# Patient Record
Sex: Male | Born: 1960 | Race: Black or African American | Hispanic: No | Marital: Married | Smoking: Never smoker
Health system: Southern US, Community
[De-identification: ages and names within clinical notes are randomized; demographics above are authoritative.]

## PROBLEM LIST (undated history)

## (undated) DIAGNOSIS — I10 Essential (primary) hypertension: Secondary | ICD-10-CM

---

## 2018-08-13 ENCOUNTER — Encounter (HOSPITAL_BASED_OUTPATIENT_CLINIC_OR_DEPARTMENT_OTHER): Payer: Self-pay | Admitting: *Deleted

## 2018-08-13 ENCOUNTER — Emergency Department (HOSPITAL_BASED_OUTPATIENT_CLINIC_OR_DEPARTMENT_OTHER)
Admission: EM | Admit: 2018-08-13 | Discharge: 2018-08-13 | Disposition: A | Discharge status: Home / Self Care | Payer: Self-pay | Attending: Emergency Medicine | Admitting: Emergency Medicine

## 2018-08-13 ENCOUNTER — Emergency Department (HOSPITAL_BASED_OUTPATIENT_CLINIC_OR_DEPARTMENT_OTHER): Payer: Self-pay

## 2018-08-13 ENCOUNTER — Other Ambulatory Visit: Payer: Self-pay

## 2018-08-13 DIAGNOSIS — M545 Low back pain, unspecified: Secondary | ICD-10-CM

## 2018-08-13 DIAGNOSIS — Z79899 Other long term (current) drug therapy: Secondary | ICD-10-CM | POA: Insufficient documentation

## 2018-08-13 DIAGNOSIS — R109 Unspecified abdominal pain: Secondary | ICD-10-CM | POA: Insufficient documentation

## 2018-08-13 DIAGNOSIS — I10 Essential (primary) hypertension: Secondary | ICD-10-CM | POA: Insufficient documentation

## 2018-08-13 HISTORY — DX: Essential (primary) hypertension: I10

## 2018-08-13 LAB — BASIC METABOLIC PANEL
Anion gap: 9 (ref 5–15)
BUN: 14 mg/dL (ref 6–20)
CO2: 22 mmol/L (ref 22–32)
Calcium: 8.4 mg/dL — ABNORMAL LOW (ref 8.9–10.3)
Chloride: 101 mmol/L (ref 98–111)
Creatinine, Ser: 0.9 mg/dL (ref 0.61–1.24)
GFR calc Af Amer: >60 mL/min (ref >60)
GFR calc non Af Amer: >60 mL/min (ref >60)
Glucose, Bld: 111 mg/dL — ABNORMAL HIGH (ref 70–99)
Potassium: 3.5 mmol/L (ref 3.5–5.1)
Sodium: 132 mmol/L — ABNORMAL LOW (ref 135–145)

## 2018-08-13 LAB — URINALYSIS, ROUTINE W REFLEX MICROSCOPIC
Bilirubin Urine: NEGATIVE
Glucose, UA: NEGATIVE mg/dL
Hgb urine dipstick: NEGATIVE
Ketones, ur: NEGATIVE mg/dL
Leukocytes, UA: NEGATIVE
Nitrite: NEGATIVE
Protein, ur: NEGATIVE mg/dL
Specific Gravity, Urine: 1.015 (ref 1.005–1.030)
pH: 6 (ref 5.0–8.0)

## 2018-08-13 LAB — CBC WITH DIFFERENTIAL/PLATELET
Abs Immature Granulocytes: 0.02 10*3/uL (ref 0.00–0.07)
Basophils Absolute: 0 10*3/uL (ref 0.0–0.1)
Basophils Relative: 0 %
EOS PCT: 1 %
Eosinophils Absolute: 0.1 10*3/uL (ref 0.0–0.5)
HCT: 49 % (ref 39.0–52.0)
Hemoglobin: 15.6 g/dL (ref 13.0–17.0)
Immature Granulocytes: 0 %
Lymphocytes Relative: 25 %
Lymphs Abs: 2 10*3/uL (ref 0.7–4.0)
MCH: 28 pg (ref 26.0–34.0)
MCHC: 31.8 g/dL (ref 30.0–36.0)
MCV: 88 fL (ref 80.0–100.0)
Monocytes Absolute: 0.7 10*3/uL (ref 0.1–1.0)
Monocytes Relative: 8 %
Neutro Abs: 5 10*3/uL (ref 1.7–7.7)
Neutrophils Relative %: 66 %
Platelets: 222 10*3/uL (ref 150–400)
RBC: 5.57 MIL/uL (ref 4.22–5.81)
RDW: 13.5 % (ref 11.5–15.5)
WBC: 7.7 10*3/uL (ref 4.0–10.5)
nRBC: 0 % (ref 0.0–0.2)

## 2018-08-13 IMAGING — CT CT ABD-PELV W/ CM
2 of 5 series · 16 of 46 positions shown, 18 images · IV contrast (APPLIED)
Comparison: None.

CLINICAL DATA: Right flank pain

EXAM:
CT ABDOMEN AND PELVIS WITH CONTRAST
TECHNIQUE: Multidetector CT imaging of the abdomen and pelvis was performed
using the standard protocol following bolus administration of
intravenous contrast.
CONTRAST:  100mL [I6] IOPAMIDOL ([I6]) INJECTION 61%

[Series 2: axial st · axial · 0.84mm/px · z∈[+687,+1162]mm · 13 of 107 slices shown, 15 images]
[im 6/107  soft-tissue]
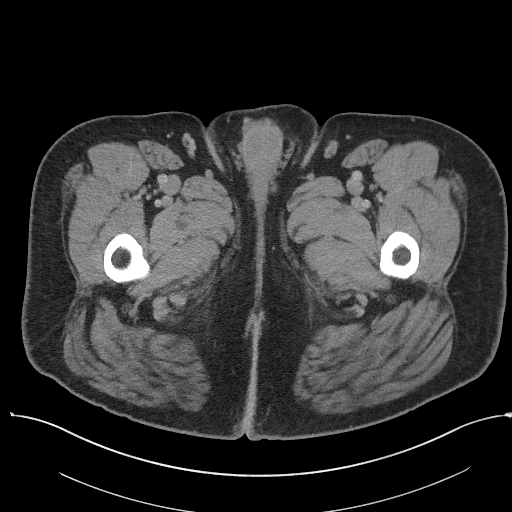
[im 6/107  bone]
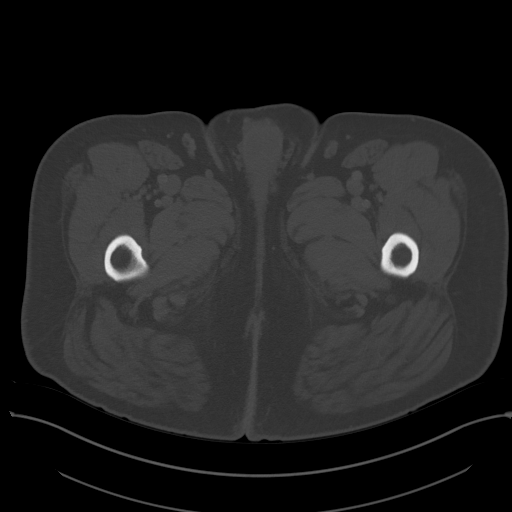
[im 12/107  soft-tissue]
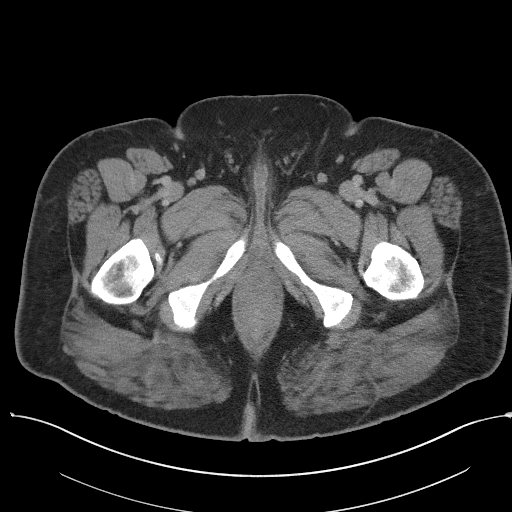
[im 24/107  soft-tissue]
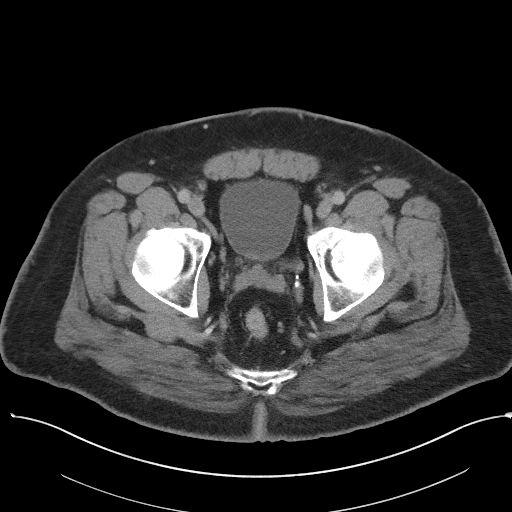
[im 30/107  soft-tissue]
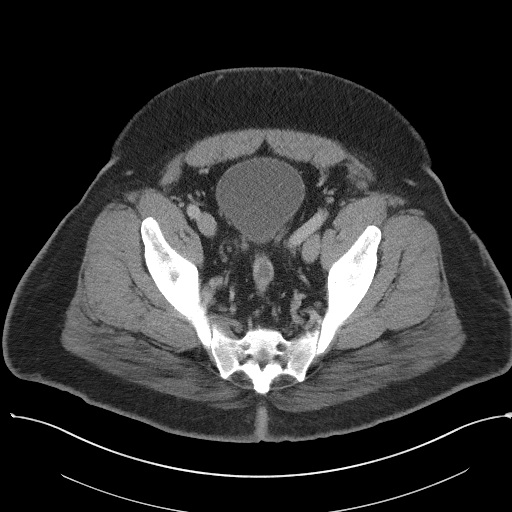
[im 36/107  soft-tissue]
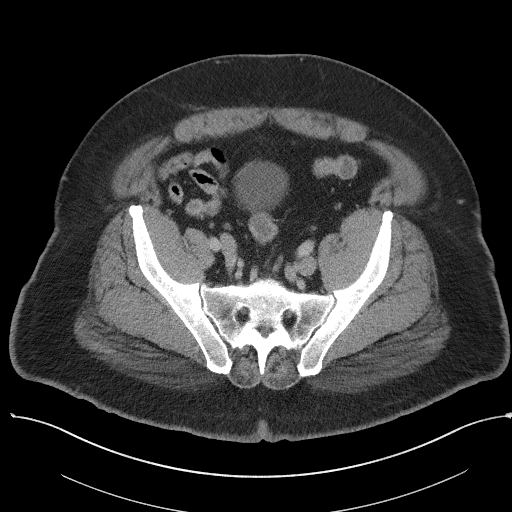
[im 48/107  soft-tissue]
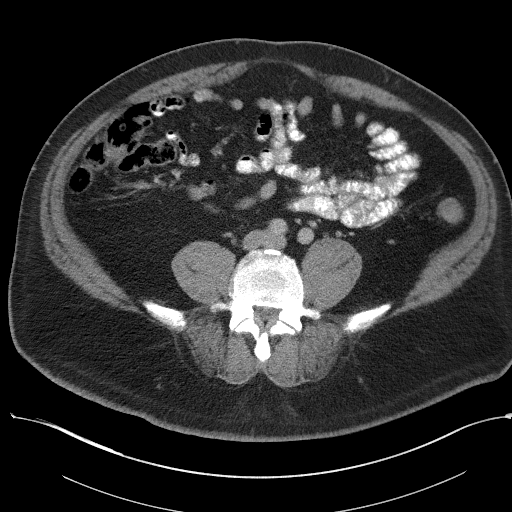
[im 54/107  soft-tissue]
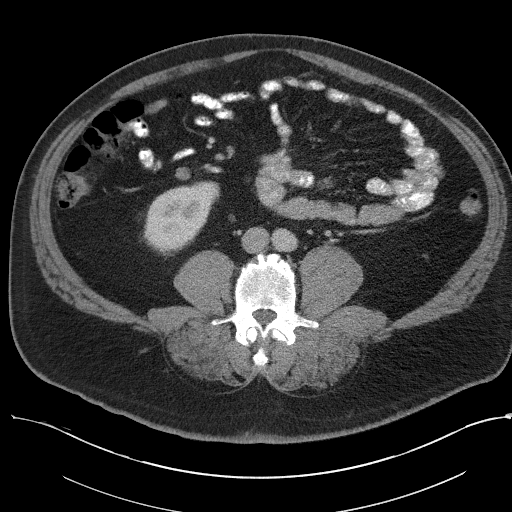
[im 59/107  soft-tissue]
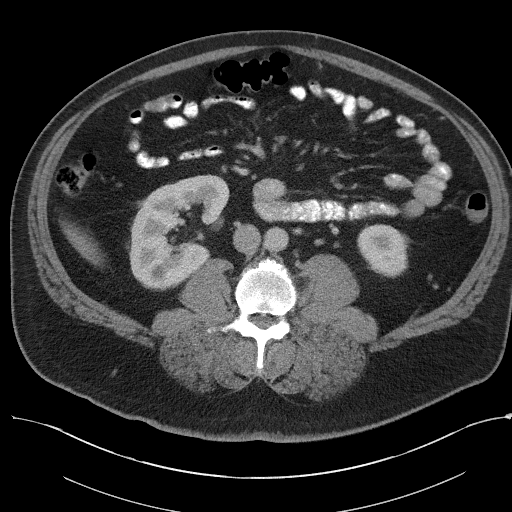
[im 71/107  soft-tissue]
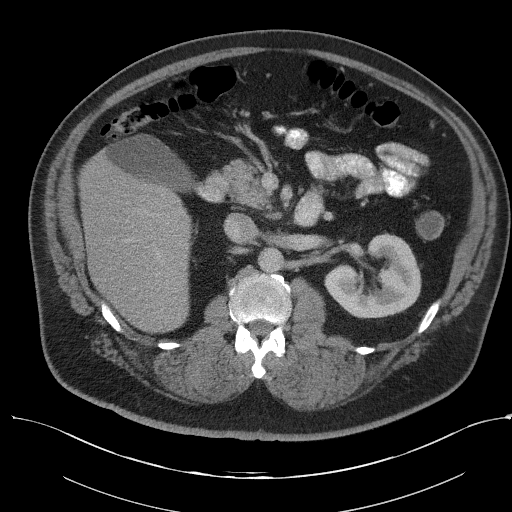
[im 71/107  bone]
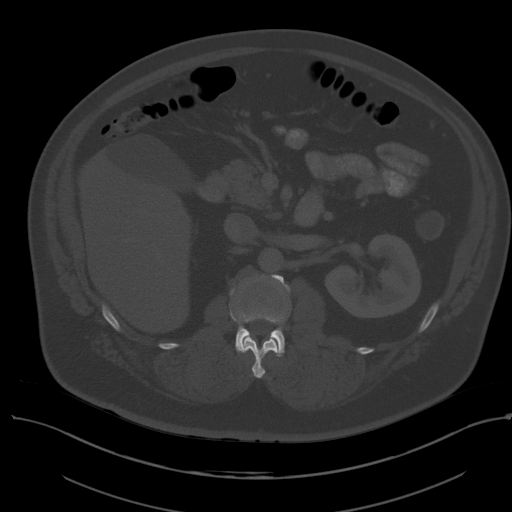
[im 77/107  soft-tissue]
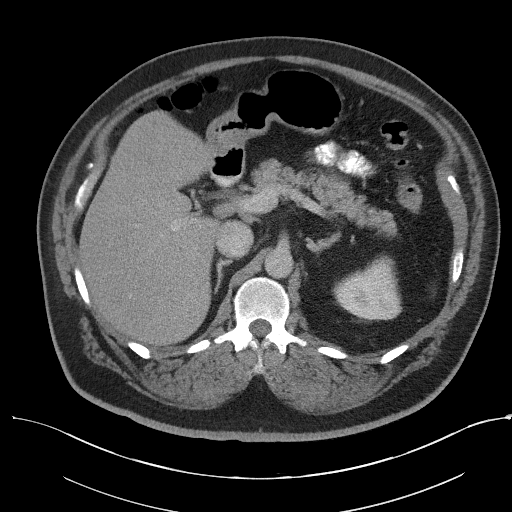
[im 83/107  soft-tissue]
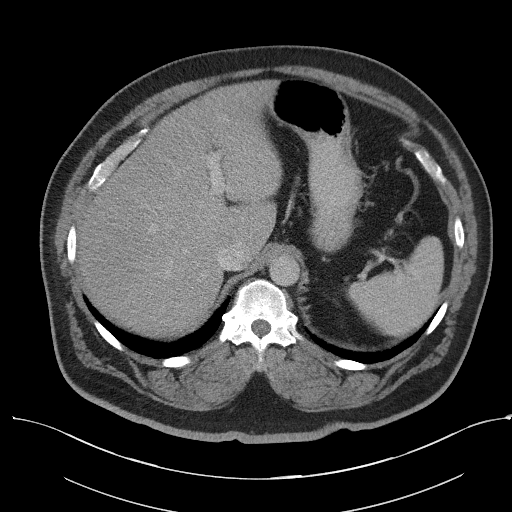
[im 95/107  soft-tissue]
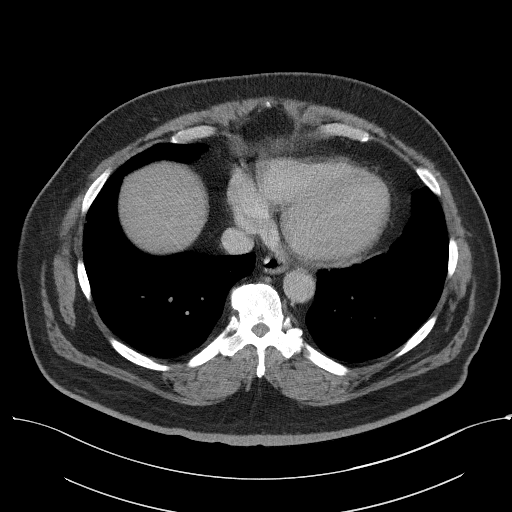
[im 101/107  soft-tissue]
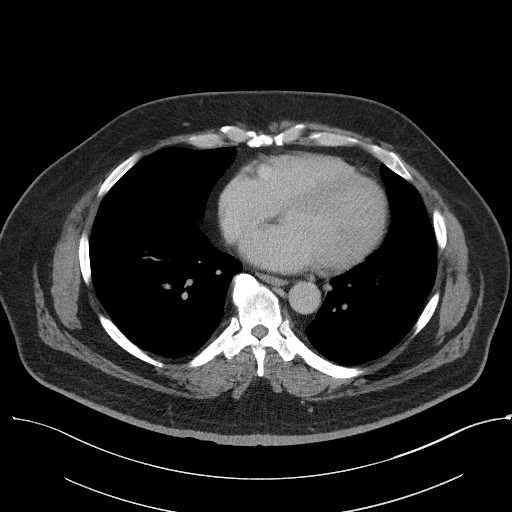

[Series 5: coronal st · coronal · 0.84mm/px · 3 of 114 slices shown]
[im 38/114  soft-tissue]
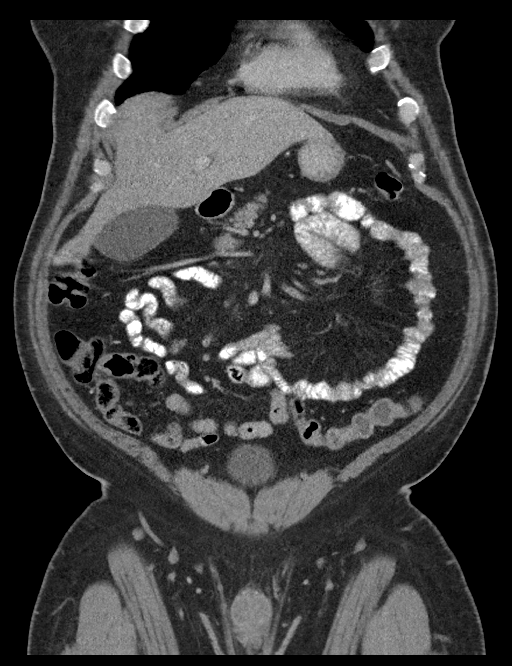
[im 51/114  soft-tissue]
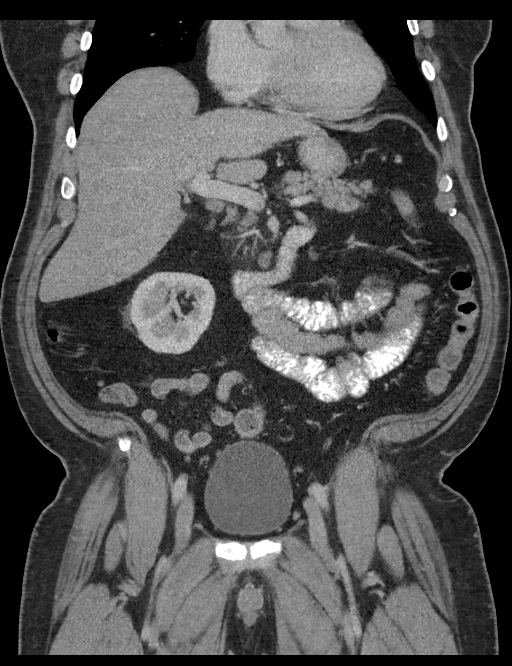
[im 63/114  soft-tissue]
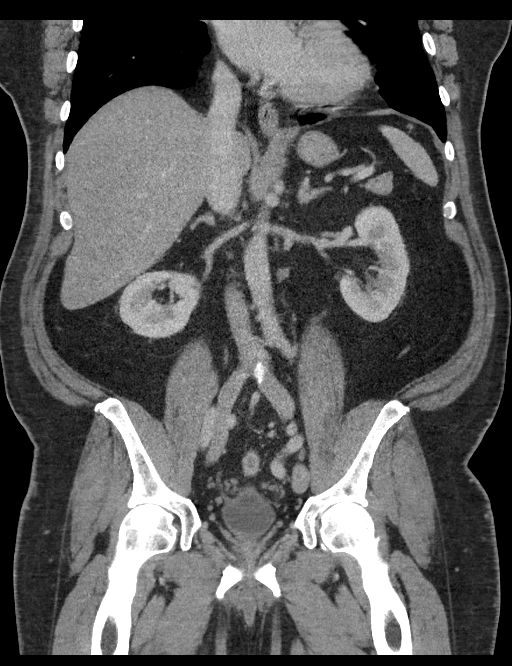

[16 of 46 positions shown; findings below may reference images not displayed]

FINDINGS: LOWER CHEST: There is no basilar pleural or apical pericardial
effusion.

HEPATOBILIARY: The hepatic contours and density are normal. There is
no intra- or extrahepatic biliary dilatation. The gallbladder is
normal.

PANCREAS: The pancreatic parenchymal contours are normal and there
is no ductal dilatation. There is no peripancreatic fluid
collection.

SPLEEN: Normal.

ADRENALS/URINARY TRACT:

--Adrenal glands: Normal.

--Right kidney/ureter: No hydronephrosis, nephroureterolithiasis,
perinephric stranding or solid renal mass.

--Left kidney/ureter: No hydronephrosis, nephroureterolithiasis,
perinephric stranding or solid renal mass.

--Urinary bladder: Normal for degree of distention

STOMACH/BOWEL:

--Stomach/Duodenum: There is no hiatal hernia or other gastric
abnormality. The duodenal course and caliber are normal.

--Small bowel: No dilatation or inflammation.

--Colon: No focal abnormality.

--Appendix: Normal.

VASCULAR/LYMPHATIC: Normal course and caliber of the major abdominal
vessels. No abdominal or pelvic lymphadenopathy.

REPRODUCTIVE: No free fluid in the pelvis.

MUSCULOSKELETAL. No bony spinal canal stenosis or focal osseous
abnormality.

OTHER: None.
IMPRESSION: Normal CT of the abdomen and pelvis.

## 2018-08-13 MED ORDER — MORPHINE SULFATE (PF) 4 MG/ML IV SOLN
4.0000 mg | Freq: Once | INTRAVENOUS | Status: AC
  Administered 2018-08-13: 4 mg via INTRAVENOUS
  Filled 2018-08-13: qty 1

## 2018-08-13 MED ORDER — METHOCARBAMOL 500 MG PO TABS: 500.0000 mg | ORAL_TABLET | Freq: Every evening | ORAL | 0 refills | Status: DC | PRN

## 2018-08-13 MED ORDER — NAPROXEN 500 MG PO TABS: 500.0000 mg | ORAL_TABLET | Freq: Two times a day (BID) | ORAL | 0 refills | Status: AC

## 2018-08-13 MED ORDER — IOPAMIDOL (ISOVUE-300) INJECTION 61%
100.0000 mL | Freq: Once | INTRAVENOUS | Status: AC | PRN
  Administered 2018-08-13: 100 mL via INTRAVENOUS

## 2018-08-13 NOTE — ED Triage Notes (Signed)
Lower back pain for a week. Pain has gradually gotten worse with time. Back spasms. He is having increased pain when he tries to stand from a sitting position.

## 2018-08-13 NOTE — ED Provider Notes (Signed)
[facility] EMERGENCY DEPARTMENT Provider Note   CSN: [id number] Arrival date & time: 08/13/18  1259     History   Chief Complaint Chief Complaint  Patient presents with  . Back Pain    HPI Victor Erickson is a 57 y.o. male senting for evaluation of back pain.  Patient states that the past week, he has been having gradually worsening low back pain.  Pain is on the right side, and shoots to the left side when he walks.  He denies trauma, fall, or injury.  Denies fevers, chills, rash, loss of bowel bladder control, numbness, tingling, history of cancer, history of IV drug use.  Patient states he does have a history of back pain that comes from his prostate, but most recent check of his prostate was fine.  He does report urinary frequency with not much output, however this is chronic.  He denies dysuria or hematuria.  He denies history of kidney stones.  Pain is constant, worse with walking.  He has tried ibuprofen without improvement.  Pain does not radiate down his leg.  History of hypertension for which he takes medication, no other medical problems.  HPI  Past Medical History:  Diagnosis Date  . Hypertension     There are no active problems to display for this patient.   History reviewed. No pertinent surgical history.      Home Medications    Prior to Admission medications   Medication Sig Start Date End Date Taking? Authorizing Provider  amLODipine (NORVASC) 5 MG tablet Take 5 mg by mouth daily.   Yes [provider]  methocarbamol (ROBAXIN) 500 MG tablet Take 1 tablet (500 mg total) by mouth at bedtime as needed for muscle spasms. 08/13/18   Rushton Early, PA-C  naproxen (NAPROSYN) 500 MG tablet Take 1 tablet (500 mg total) by mouth 2 (two) times daily with a meal for 7 days. 08/13/18 08/20/18  Ramiah Helfrich, PA-C    Family History No family history on file.  Social History Social History   Tobacco Use  . Smoking status: Never  Smoker  . Smokeless tobacco: Never Used  Substance Use Topics  . Alcohol use: Yes  . Drug use: Never     Allergies   Patient has no known allergies.   Review of Systems Review of Systems  Musculoskeletal: Positive for back pain.  Neurological: Negative for numbness.     Physical Exam Updated Vital Signs BP (!) 163/87 (BP Location: Right Arm)   Pulse (!) 57   Temp 98.3 F (36.8 C) (Oral)   Resp 18   Ht 6\' 1"  (1.854 m)   Wt 113.4 kg   SpO2 99%   BMI 32.98 kg/m   Physical Exam Vitals signs and nursing note reviewed.  Constitutional:      General: He is not in acute distress.    Appearance: He is well-developed.     Comments: Sitting in the bed in no acute distress  HENT:     Head: Normocephalic and atraumatic.  Eyes:     Conjunctiva/sclera: Conjunctivae normal.     Pupils: Pupils are equal, round, and reactive to light.  Neck:     Musculoskeletal: Normal range of motion and neck supple.  Cardiovascular:     Rate and Rhythm: Normal rate and regular rhythm.  Pulmonary:     Effort: Pulmonary effort is normal. No respiratory distress.     Breath sounds: Normal breath sounds. No wheezing.  Abdominal:  General: Bowel sounds are normal. There is no distension.     Palpations: Abdomen is soft.     Tenderness: There is no abdominal tenderness.     Comments: No CVA tenderness.  Musculoskeletal: Normal range of motion.     Comments: Mild tenderness palpation of right low back.  Pain over midline fine.  No step-offs or deformities.  No tenderness palpation of the left low back.  No pain with straight leg raise.  Pedal pulses intact bilaterally.  Strength of lower extremities intact bilaterally.  No saddle paresthesias.  Skin:    General: Skin is warm and dry.     Capillary Refill: Capillary refill takes less than 2 seconds.  Neurological:     Mental Status: He is alert and oriented to person, place, and time.     Deep Tendon Reflexes: Reflexes normal.      ED  Treatments / Results  Labs (all labs ordered are listed, but only abnormal results are displayed) Labs Reviewed  BASIC METABOLIC PANEL - Abnormal; Notable for the following components:      Result Value   Sodium 132 (*)    Glucose, Bld 111 (*)    Calcium 8.4 (*)    All other components within normal limits  URINE CULTURE  CBC WITH DIFFERENTIAL/PLATELET  URINALYSIS, ROUTINE W REFLEX MICROSCOPIC    EKG None  Radiology Ct Abdomen Pelvis W Contrast  Result Date: 08/13/2018 CLINICAL DATA:  Right flank pain EXAM: CT ABDOMEN AND PELVIS WITH CONTRAST TECHNIQUE: Multidetector CT imaging of the abdomen and pelvis was performed using the standard protocol following bolus administration of intravenous contrast. CONTRAST:  100mL ISOVUE-300 IOPAMIDOL (ISOVUE-300) INJECTION 61% COMPARISON:  None. FINDINGS: LOWER CHEST: There is no basilar pleural or apical pericardial effusion. HEPATOBILIARY: The hepatic contours and density are normal. There is no intra- or extrahepatic biliary dilatation. The gallbladder is normal. PANCREAS: The pancreatic parenchymal contours are normal and there is no ductal dilatation. There is no peripancreatic fluid collection. SPLEEN: Normal. ADRENALS/URINARY TRACT: --Adrenal glands: Normal. --Right kidney/ureter: No hydronephrosis, nephroureterolithiasis, perinephric stranding or solid renal mass. --Left kidney/ureter: No hydronephrosis, nephroureterolithiasis, perinephric stranding or solid renal mass. --Urinary bladder: Normal for degree of distention STOMACH/BOWEL: --Stomach/Duodenum: There is no hiatal hernia or other gastric abnormality. The duodenal course and caliber are normal. --Small bowel: No dilatation or inflammation. --Colon: No focal abnormality. --Appendix: Normal. VASCULAR/LYMPHATIC: Normal course and caliber of the major abdominal vessels. No abdominal or pelvic lymphadenopathy. REPRODUCTIVE: No free fluid in the pelvis. MUSCULOSKELETAL. No bony spinal canal  stenosis or focal osseous abnormality. OTHER: None. IMPRESSION: Normal CT of the abdomen and pelvis. Electronically Signed   By: Deatra RobinsonKevin  Herman M.D.   On: 08/13/2018 16:20    Procedures Procedures (including critical care time)  Medications Ordered in ED Medications  iopamidol (ISOVUE-300) 61 % injection 100 mL (100 mLs Intravenous Contrast Given 08/13/18 1600)  morphine 4 MG/ML injection 4 mg (4 mg Intravenous Given 08/13/18 1624)     Initial Impression / Assessment and Plan / ED Course  I have reviewed the triage vital signs and the nursing notes.  Pertinent labs & imaging results that were available during my care of the patient were reviewed by me and considered in my medical decision making (see chart for details).     Patient presenting for evaluation of low back pain.  Physical exam reassuring, neurovascularly intact.  No red flags for back pain. However, patient reports similar back pain when he has had issues with his  prostate.  Also has urinary symptoms, this appears more chronic.  As pain is not significantly reproducible on exam, and no pain with straight leg raise or radiation into his legs, consider need for CT scan for further evaluation to rule out prostate abnormalities, kidney stone, or other concerning findings.  Labs reassuring, no leukocytosis.  Hemoglobin and electrolytes stable.  UA without blood or infection.  CT pending.  CT without acute findings or concerns.  No kidney stone.  Prostate is not inflamed or infected.  No identifiable cause for patient's pain on CT.  Discussed findings with patient.  Discussed that symptoms are likely musculoskeletal.  Discussed treatment with NSAIDs, muscle relaxers, muscle creams.  Patient to follow-up with primary care.  At this time, patient appears safe for discharge.  Return precautions given.  Patient states he understands agrees plan.  Final Clinical Impressions(s) / ED Diagnoses   Final diagnoses:  Acute right-sided low  back pain without sciatica    ED Discharge Orders         Ordered    naproxen (NAPROSYN) 500 MG tablet  2 times daily with meals     08/13/18 1729    methocarbamol (ROBAXIN) 500 MG tablet  At bedtime PRN     08/13/18 1729           Tashe Purdon, PA-C 08/13/18 2040    Little, Ambrose Finland, MD 08/15/18 1106

## 2018-08-13 NOTE — ED Notes (Signed)
Pt. Reports he is having lower lower back pain with also.

## 2018-08-13 NOTE — ED Notes (Signed)
Pt. Reports to RN he had PSA done approx. 4 mths ago and was in normal range and has not taken meds for prostate in a year or so.

## 2018-08-13 NOTE — Discharge Instructions (Signed)
Take naproxen 2 times a day with meals.  Do not take other anti-inflammatories at the same time (Advil, Motrin, ibuprofen, Aleve). You may supplement with Tylenol if you need further pain control. Use Robaxin as needed for muscle stiffness or soreness. Have caution, as this may make you tired or groggy. Do not drive or operate heavy machinery while taking this medication.  Use muscle creams (bengay, icy hot, salonpas) as needed for pain.  Follow up with your primary care doctor if pain is not improving with this treatment in 2 weeks.  Return to the ER if you develop high fevers, numbness, loss of bowel or bladder control, or any new or concerning symptoms.   

## 2018-08-15 LAB — URINE CULTURE: Culture: NO GROWTH

## 2021-12-25 ENCOUNTER — Encounter (HOSPITAL_COMMUNITY): Payer: Self-pay | Admitting: Pulmonary Disease

## 2021-12-25 ENCOUNTER — Emergency Department (HOSPITAL_COMMUNITY): Payer: BC Managed Care – PPO

## 2021-12-25 ENCOUNTER — Inpatient Hospital Stay (HOSPITAL_COMMUNITY): Payer: BC Managed Care – PPO

## 2021-12-25 ENCOUNTER — Inpatient Hospital Stay (HOSPITAL_COMMUNITY)
Admission: EM | Admit: 2021-12-25 | Discharge: 2022-01-15 | DRG: 100 | Payer: BC Managed Care – PPO | Attending: Internal Medicine | Admitting: Internal Medicine

## 2021-12-25 DIAGNOSIS — G8191 Hemiplegia, unspecified affecting right dominant side: Secondary | ICD-10-CM

## 2021-12-25 DIAGNOSIS — R509 Fever, unspecified: Secondary | ICD-10-CM | POA: Diagnosis not present

## 2021-12-25 DIAGNOSIS — K7010 Alcoholic hepatitis without ascites: Secondary | ICD-10-CM | POA: Diagnosis present

## 2021-12-25 DIAGNOSIS — L89116 Pressure-induced deep tissue damage of right upper back: Secondary | ICD-10-CM | POA: Diagnosis present

## 2021-12-25 DIAGNOSIS — F10231 Alcohol dependence with withdrawal delirium: Secondary | ICD-10-CM | POA: Diagnosis not present

## 2021-12-25 DIAGNOSIS — G40901 Epilepsy, unspecified, not intractable, with status epilepticus: Secondary | ICD-10-CM | POA: Diagnosis present

## 2021-12-25 DIAGNOSIS — G4089 Other seizures: Secondary | ICD-10-CM | POA: Diagnosis not present

## 2021-12-25 DIAGNOSIS — G928 Other toxic encephalopathy: Secondary | ICD-10-CM | POA: Diagnosis present

## 2021-12-25 DIAGNOSIS — E440 Moderate protein-calorie malnutrition: Secondary | ICD-10-CM | POA: Diagnosis present

## 2021-12-25 DIAGNOSIS — R569 Unspecified convulsions: Secondary | ICD-10-CM | POA: Diagnosis not present

## 2021-12-25 DIAGNOSIS — K625 Hemorrhage of anus and rectum: Secondary | ICD-10-CM | POA: Diagnosis not present

## 2021-12-25 DIAGNOSIS — R4189 Other symptoms and signs involving cognitive functions and awareness: Principal | ICD-10-CM

## 2021-12-25 DIAGNOSIS — J9601 Acute respiratory failure with hypoxia: Secondary | ICD-10-CM | POA: Diagnosis present

## 2021-12-25 DIAGNOSIS — Z66 Do not resuscitate: Secondary | ICD-10-CM | POA: Diagnosis present

## 2021-12-25 DIAGNOSIS — Z7189 Other specified counseling: Secondary | ICD-10-CM | POA: Diagnosis not present

## 2021-12-25 DIAGNOSIS — Z6831 Body mass index (BMI) 31.0-31.9, adult: Secondary | ICD-10-CM | POA: Diagnosis not present

## 2021-12-25 DIAGNOSIS — G936 Cerebral edema: Secondary | ICD-10-CM | POA: Diagnosis present

## 2021-12-25 DIAGNOSIS — L89810 Pressure ulcer of head, unstageable: Secondary | ICD-10-CM | POA: Diagnosis present

## 2021-12-25 DIAGNOSIS — I10 Essential (primary) hypertension: Secondary | ICD-10-CM | POA: Diagnosis present

## 2021-12-25 DIAGNOSIS — R739 Hyperglycemia, unspecified: Secondary | ICD-10-CM | POA: Diagnosis not present

## 2021-12-25 DIAGNOSIS — L89219 Pressure ulcer of right hip, unspecified stage: Secondary | ICD-10-CM | POA: Diagnosis not present

## 2021-12-25 DIAGNOSIS — M6282 Rhabdomyolysis: Secondary | ICD-10-CM | POA: Diagnosis present

## 2021-12-25 DIAGNOSIS — E870 Hyperosmolality and hypernatremia: Secondary | ICD-10-CM | POA: Diagnosis not present

## 2021-12-25 DIAGNOSIS — R6521 Severe sepsis with septic shock: Secondary | ICD-10-CM | POA: Diagnosis not present

## 2021-12-25 DIAGNOSIS — G40A01 Absence epileptic syndrome, not intractable, with status epilepticus: Secondary | ICD-10-CM | POA: Diagnosis not present

## 2021-12-25 DIAGNOSIS — T50905A Adverse effect of unspecified drugs, medicaments and biological substances, initial encounter: Secondary | ICD-10-CM | POA: Diagnosis present

## 2021-12-25 DIAGNOSIS — A419 Sepsis, unspecified organism: Secondary | ICD-10-CM | POA: Diagnosis present

## 2021-12-25 DIAGNOSIS — F32A Depression, unspecified: Secondary | ICD-10-CM | POA: Diagnosis present

## 2021-12-25 DIAGNOSIS — K7581 Nonalcoholic steatohepatitis (NASH): Secondary | ICD-10-CM | POA: Diagnosis present

## 2021-12-25 DIAGNOSIS — Z515 Encounter for palliative care: Secondary | ICD-10-CM

## 2021-12-25 DIAGNOSIS — H518 Other specified disorders of binocular movement: Secondary | ICD-10-CM | POA: Diagnosis not present

## 2021-12-25 DIAGNOSIS — R4182 Altered mental status, unspecified: Secondary | ICD-10-CM | POA: Diagnosis not present

## 2021-12-25 DIAGNOSIS — G3184 Mild cognitive impairment, so stated: Secondary | ICD-10-CM | POA: Diagnosis not present

## 2021-12-25 DIAGNOSIS — I469 Cardiac arrest, cause unspecified: Secondary | ICD-10-CM | POA: Diagnosis not present

## 2021-12-25 DIAGNOSIS — L89892 Pressure ulcer of other site, stage 2: Secondary | ICD-10-CM | POA: Diagnosis present

## 2021-12-25 DIAGNOSIS — E785 Hyperlipidemia, unspecified: Secondary | ICD-10-CM | POA: Diagnosis present

## 2021-12-25 DIAGNOSIS — J690 Pneumonitis due to inhalation of food and vomit: Secondary | ICD-10-CM | POA: Diagnosis present

## 2021-12-25 DIAGNOSIS — E8770 Fluid overload, unspecified: Secondary | ICD-10-CM | POA: Diagnosis not present

## 2021-12-25 DIAGNOSIS — R414 Neurologic neglect syndrome: Secondary | ICD-10-CM | POA: Diagnosis present

## 2021-12-25 DIAGNOSIS — Z79899 Other long term (current) drug therapy: Secondary | ICD-10-CM

## 2021-12-25 DIAGNOSIS — G9389 Other specified disorders of brain: Secondary | ICD-10-CM | POA: Diagnosis present

## 2021-12-25 DIAGNOSIS — N179 Acute kidney failure, unspecified: Secondary | ICD-10-CM

## 2021-12-25 DIAGNOSIS — L8990 Pressure ulcer of unspecified site, unspecified stage: Secondary | ICD-10-CM | POA: Diagnosis present

## 2021-12-25 DIAGNOSIS — L89819 Pressure ulcer of head, unspecified stage: Secondary | ICD-10-CM | POA: Diagnosis not present

## 2021-12-25 DIAGNOSIS — I5A Non-ischemic myocardial injury (non-traumatic): Secondary | ICD-10-CM

## 2021-12-25 DIAGNOSIS — L89899 Pressure ulcer of other site, unspecified stage: Secondary | ICD-10-CM | POA: Diagnosis not present

## 2021-12-25 DIAGNOSIS — R339 Retention of urine, unspecified: Secondary | ICD-10-CM | POA: Diagnosis present

## 2021-12-25 DIAGNOSIS — R29728 NIHSS score 28: Secondary | ICD-10-CM | POA: Diagnosis present

## 2021-12-25 DIAGNOSIS — D696 Thrombocytopenia, unspecified: Secondary | ICD-10-CM | POA: Diagnosis present

## 2021-12-25 DIAGNOSIS — E8809 Other disorders of plasma-protein metabolism, not elsewhere classified: Secondary | ICD-10-CM | POA: Diagnosis present

## 2021-12-25 DIAGNOSIS — L89106 Pressure-induced deep tissue damage of unspecified part of back: Secondary | ICD-10-CM | POA: Diagnosis present

## 2021-12-25 DIAGNOSIS — J9600 Acute respiratory failure, unspecified whether with hypoxia or hypercapnia: Secondary | ICD-10-CM | POA: Diagnosis not present

## 2021-12-25 DIAGNOSIS — H1133 Conjunctival hemorrhage, bilateral: Secondary | ICD-10-CM | POA: Diagnosis present

## 2021-12-25 DIAGNOSIS — R0603 Acute respiratory distress: Secondary | ICD-10-CM | POA: Diagnosis present

## 2021-12-25 HISTORY — DX: Non-ischemic myocardial injury (non-traumatic): I5A

## 2021-12-25 HISTORY — DX: Acute kidney failure, unspecified: N17.9

## 2021-12-25 LAB — I-STAT ARTERIAL BLOOD GAS, ED
Acid-Base Excess: 0 mmol/L (ref 0.0–2.0)
Bicarbonate: 25.2 mmol/L (ref 20.0–28.0)
Calcium, Ion: 1.15 mmol/L (ref 1.15–1.40)
HCT: 47 % (ref 39.0–52.0)
Hemoglobin: 16 g/dL (ref 13.0–17.0)
O2 Saturation: 100 %
Potassium: 3.2 mmol/L — ABNORMAL LOW (ref 3.5–5.1)
Sodium: 142 mmol/L (ref 135–145)
TCO2: 26 mmol/L (ref 22–32)
pCO2 arterial: 43.2 mmHg (ref 32–48)
pH, Arterial: 7.373 (ref 7.35–7.45)
pO2, Arterial: 412 mmHg — ABNORMAL HIGH (ref 83–108)

## 2021-12-25 LAB — CBC WITH DIFFERENTIAL/PLATELET
Abs Immature Granulocytes: 0.07 10*3/uL (ref 0.00–0.07)
Basophils Absolute: 0 10*3/uL (ref 0.0–0.1)
Basophils Relative: 0 %
Eosinophils Absolute: 0 10*3/uL (ref 0.0–0.5)
Eosinophils Relative: 0 %
HCT: 50.4 % (ref 39.0–52.0)
Hemoglobin: 16.1 g/dL (ref 13.0–17.0)
Immature Granulocytes: 0 %
Lymphocytes Relative: 6 %
Lymphs Abs: 1 10*3/uL (ref 0.7–4.0)
MCH: 31.4 pg (ref 26.0–34.0)
MCHC: 31.9 g/dL (ref 30.0–36.0)
MCV: 98.2 fL (ref 80.0–100.0)
Monocytes Absolute: 1.8 10*3/uL — ABNORMAL HIGH (ref 0.1–1.0)
Monocytes Relative: 11 %
Neutro Abs: 13.4 10*3/uL — ABNORMAL HIGH (ref 1.7–7.7)
Neutrophils Relative %: 83 %
Platelets: 167 10*3/uL (ref 150–400)
RBC: 5.13 MIL/uL (ref 4.22–5.81)
RDW: 13.9 % (ref 11.5–15.5)
WBC: 16.2 10*3/uL — ABNORMAL HIGH (ref 4.0–10.5)
nRBC: 0 % (ref 0.0–0.2)

## 2021-12-25 LAB — CK TOTAL AND CKMB (NOT AT ARMC)
CK, MB: 226.7 ng/mL — ABNORMAL HIGH (ref 0.5–5.0)
Relative Index: 0.6 (ref 0.0–2.5)
Total CK: 40770 U/L — ABNORMAL HIGH (ref 49–397)

## 2021-12-25 LAB — TROPONIN I (HIGH SENSITIVITY)
Troponin I (High Sensitivity): 4127 ng/L (ref <18)
Troponin I (High Sensitivity): 6378 ng/L (ref <18)
Troponin I (High Sensitivity): 4624 ng/L (ref <18)

## 2021-12-25 LAB — I-STAT CHEM 8, ED
BUN: 41 mg/dL — ABNORMAL HIGH (ref 6–20)
Calcium, Ion: 0.87 mmol/L — CL (ref 1.15–1.40)
Chloride: 112 mmol/L — ABNORMAL HIGH (ref 98–111)
Creatinine, Ser: 1.1 mg/dL (ref 0.61–1.24)
Glucose, Bld: 191 mg/dL — ABNORMAL HIGH (ref 70–99)
HCT: 52 % (ref 39.0–52.0)
Hemoglobin: 17.7 g/dL — ABNORMAL HIGH (ref 13.0–17.0)
Potassium: 4.9 mmol/L (ref 3.5–5.1)
Sodium: 141 mmol/L (ref 135–145)
TCO2: 21 mmol/L — ABNORMAL LOW (ref 22–32)

## 2021-12-25 LAB — COMPREHENSIVE METABOLIC PANEL
ALT: 187 U/L — ABNORMAL HIGH (ref 0–44)
AST: 787 U/L — ABNORMAL HIGH (ref 15–41)
Albumin: 2.8 g/dL — ABNORMAL LOW (ref 3.5–5.0)
Alkaline Phosphatase: 66 U/L (ref 38–126)
Anion gap: 16 — ABNORMAL HIGH (ref 5–15)
BUN: 28 mg/dL — ABNORMAL HIGH (ref 6–20)
CO2: 18 mmol/L — ABNORMAL LOW (ref 22–32)
Calcium: 8.3 mg/dL — ABNORMAL LOW (ref 8.9–10.3)
Chloride: 107 mmol/L (ref 98–111)
Creatinine, Ser: 1.59 mg/dL — ABNORMAL HIGH (ref 0.61–1.24)
GFR, Estimated: 50 mL/min — ABNORMAL LOW (ref >60)
Glucose, Bld: 177 mg/dL — ABNORMAL HIGH (ref 70–99)
Potassium: 3.9 mmol/L (ref 3.5–5.1)
Sodium: 141 mmol/L (ref 135–145)
Total Bilirubin: 3.7 mg/dL — ABNORMAL HIGH (ref 0.3–1.2)
Total Protein: 7.4 g/dL (ref 6.5–8.1)

## 2021-12-25 LAB — PROTIME-INR
INR: 1.1 (ref 0.8–1.2)
Prothrombin Time: 14.3 seconds (ref 11.4–15.2)

## 2021-12-25 LAB — ETHANOL: Alcohol, Ethyl (B): <10 mg/dL (ref <10)

## 2021-12-25 LAB — LACTIC ACID, PLASMA
Lactic Acid, Venous: 1.6 mmol/L (ref 0.5–1.9)
Lactic Acid, Venous: 2.6 mmol/L (ref 0.5–1.9)

## 2021-12-25 LAB — HIV ANTIBODY (ROUTINE TESTING W REFLEX): HIV Screen 4th Generation wRfx: NONREACTIVE

## 2021-12-25 LAB — GLUCOSE, CAPILLARY
Glucose-Capillary: 165 mg/dL — ABNORMAL HIGH (ref 70–99)
Glucose-Capillary: 180 mg/dL — ABNORMAL HIGH (ref 70–99)

## 2021-12-25 LAB — TYPE AND SCREEN
ABO/RH(D): A POS
Antibody Screen: NEGATIVE

## 2021-12-25 LAB — MRSA NEXT GEN BY PCR, NASAL: MRSA by PCR Next Gen: NOT DETECTED
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
## 2021-12-25 IMAGING — MR MR HEAD WO/W CM
19 of 21 series · 41 of 48 positions shown · IV contrast (gadavist)
Comparison: CT head [DATE]

CLINICAL DATA: Stroke suspected

EXAM:
MRI HEAD WITHOUT AND WITH CONTRAST
TECHNIQUE: Multiplanar, multiecho pulse sequences of the brain and surrounding
structures were obtained without and with intravenous contrast.
CONTRAST:  10mL GADAVIST GADOBUTROL 1 MMOL/ML IV SOLN

[Series 5: DWI · axial · 3.0mm · 0.96mm/px · z∈[-116,+63]mm · 6 of 120 slices shown (1 of 4)]
[im 1/120]
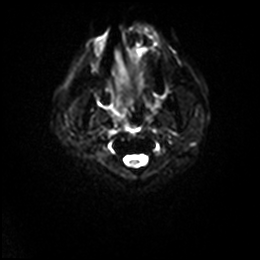
[im 24/120]
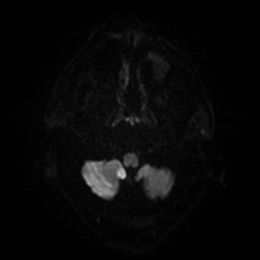
[im 48/120]
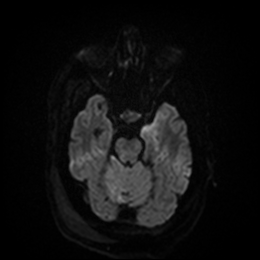
[im 72/120]
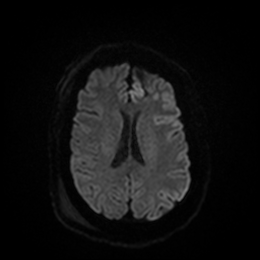
[im 96/120]
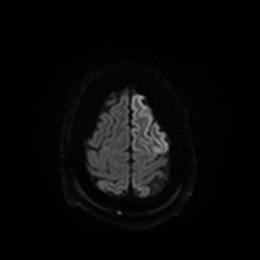
[im 120/120]
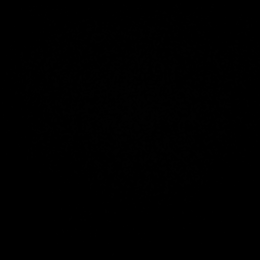

[Series 6: DWI · axial · 3.0mm · 0.96mm/px · z∈[-116,+57]mm · 2 of 59 slices shown (2 of 4)]
[im 1/59]
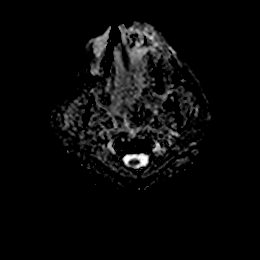
[im 59/59]
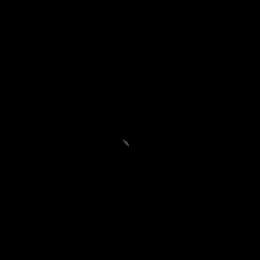

[Series 7: DWI · coronal · 4.0mm · 0.88mm/px · 3 of 82 slices shown (3 of 4)]
[im 1/82]
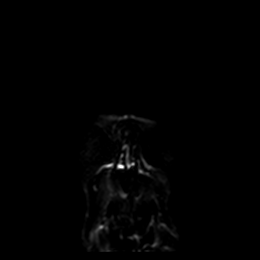
[im 41/82]
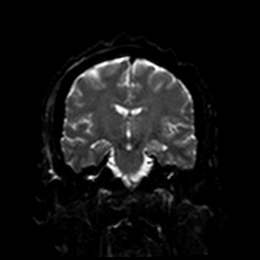
[im 82/82]
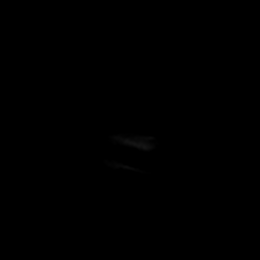

[Series 8: DWI · coronal · 4.0mm · 0.88mm/px · 2 of 41 slices shown (4 of 4)]
[im 1/41]
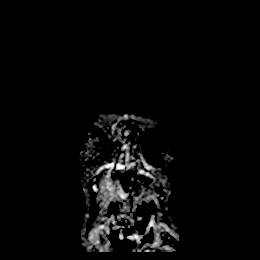
[im 41/41]
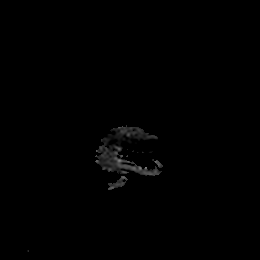

[Series 9: T1 · sagittal · 5.0mm · 0.78mm/px · 1 of 28 slices shown]
[im 1/28]
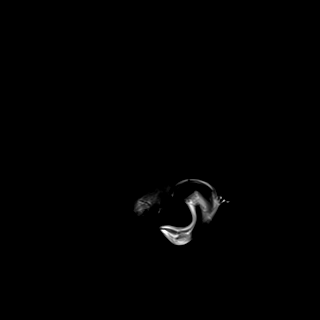

[Series 10: T2 · axial · 5.0mm · 0.78mm/px · 1 of 32 slices shown (1 of 3)]
[im 1/32]
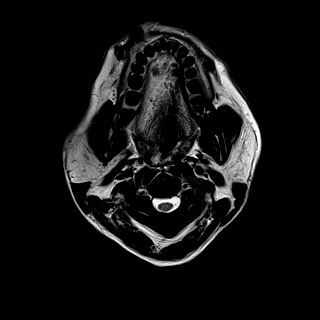

[Series 11: FLAIR · axial · 5.0mm · 0.49mm/px · 1 of 32 slices shown (1 of 3)]
[im 1/32]
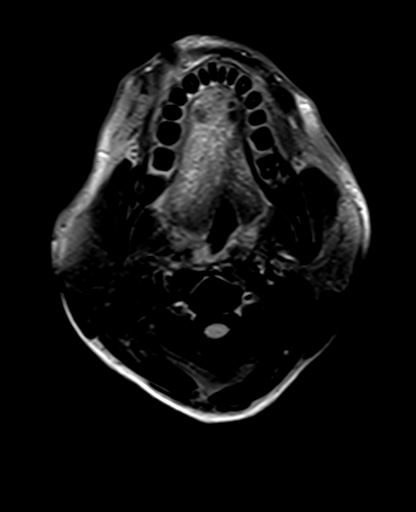

[Series 12: mag_images · axial · 3.0mm · 0.98mm/px · z∈[-112,+64]mm · 2 of 60 slices shown]
[im 1/60]
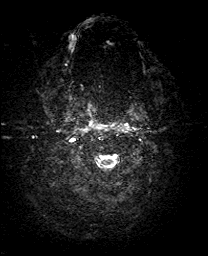
[im 60/60]
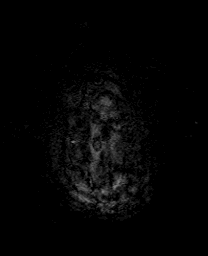

[Series 13: pha_images · axial · 3.0mm · 0.98mm/px · z∈[-112,+61]mm · 2 of 59 slices shown]
[im 1/59]
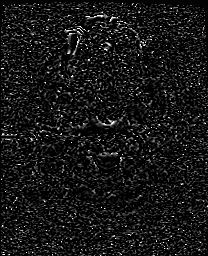
[im 59/59]
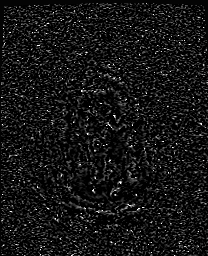

[Series 14: swi_images · axial · 3.0mm · 0.98mm/px · z∈[-112,+64]mm · 2 of 60 slices shown]
[im 1/60]
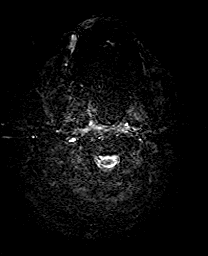
[im 60/60]
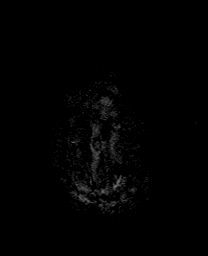

[Series 15: mip_images(sw) · axial · 24.0mm · 0.98mm/px · z∈[-102,+54]mm · 2 of 53 slices shown]
[im 1/53]
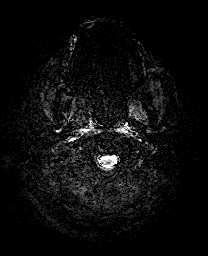
[im 53/53]
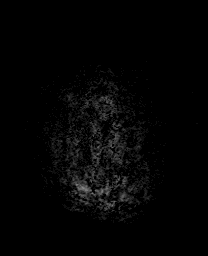

[Series 17: t1_mprage_tra_p2_iso · axial · 1.0mm · 0.98mm/px · z∈[-112,+62]mm · 7 of 175 slices shown]
[im 1/175]
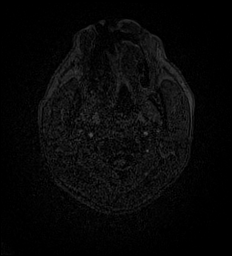
[im 30/175]
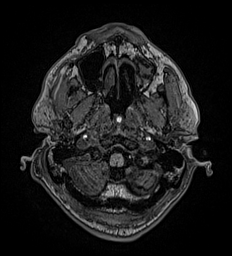
[im 59/175]
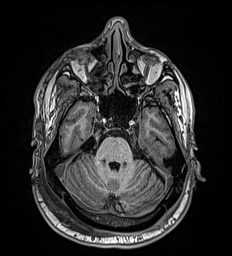
[im 88/175]
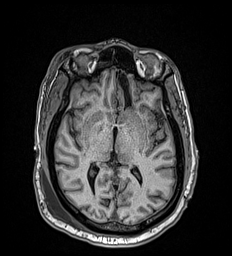
[im 117/175]
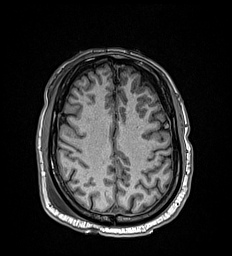
[im 146/175]
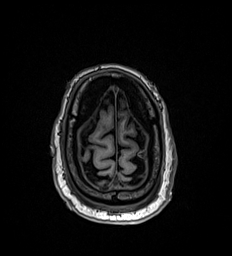
[im 175/175]
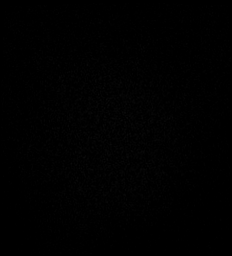

[Series 18: t1_mprage_tra_p2_iso_mpr_coronal · coronal · 1.0mm · 0.45mm/px · 2 of 128 slices shown]
[im 1/128]
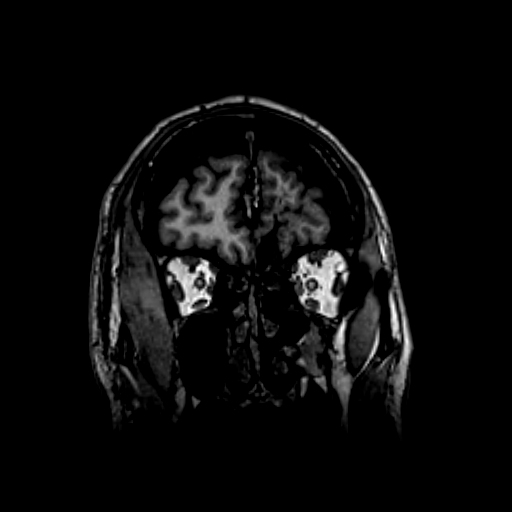
[im 32/128]
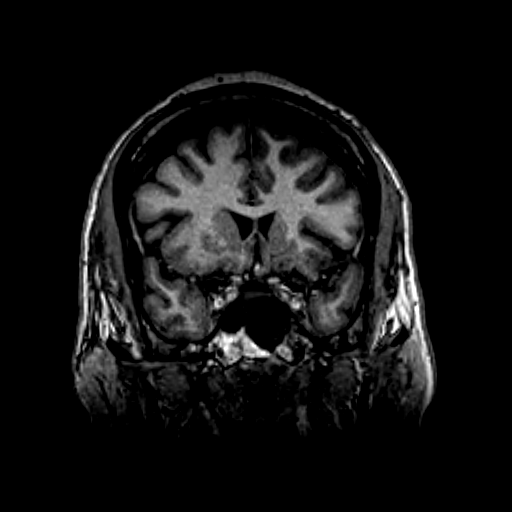

[Series 19: T2 · coronal · 3.0mm · 0.30mm/px · 2 of 43 slices shown (2 of 3)]
[im 1/43]
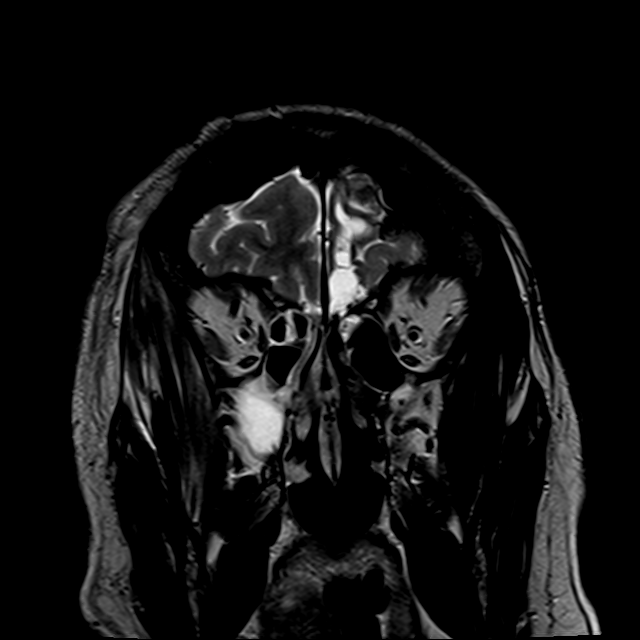
[im 43/43]
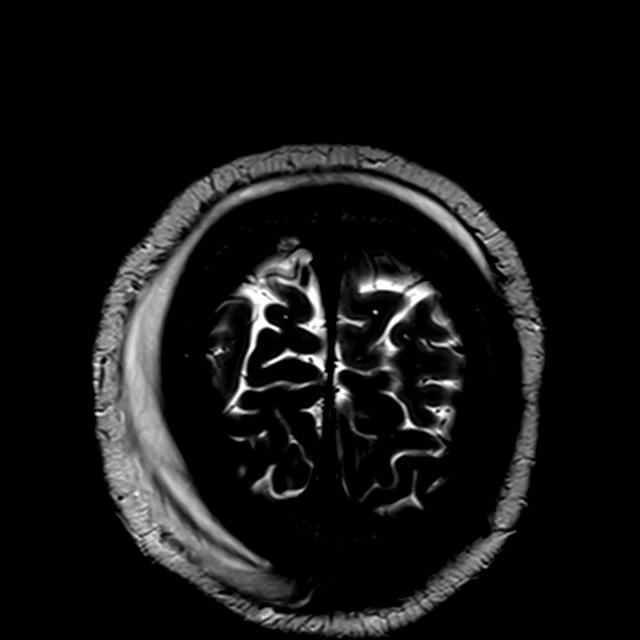

[Series 20: FLAIR · coronal · 3.0mm · 0.56mm/px · 1 of 21 slices shown (2 of 3)]
[im 1/21]
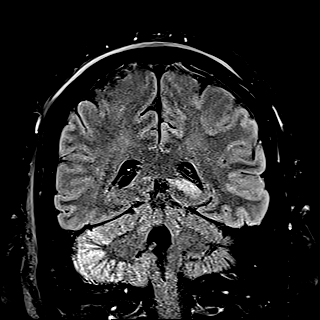

[Series 21: T2 · coronal · 5.0mm · 0.72mm/px · 1 of 33 slices shown (3 of 3)]
[im 1/33]
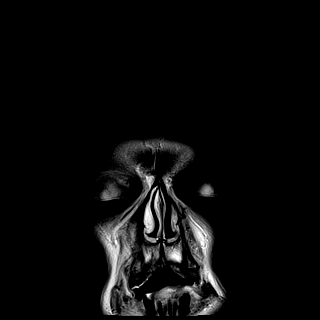

[Series 22: FLAIR · coronal · 3.0mm · 0.59mm/px · 2 of 42 slices shown (3 of 3)]
[im 1/42]
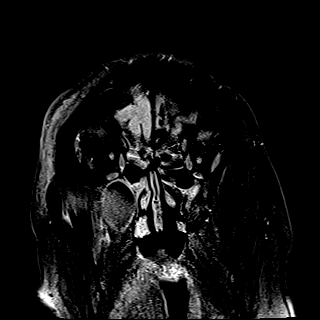
[im 42/42]
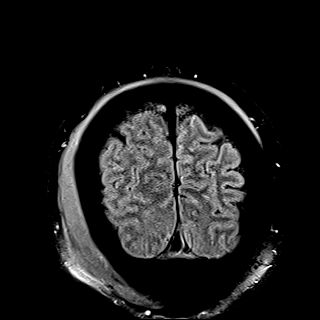

[Series 23: T1 post-contrast · sagittal · 5.0mm · 0.78mm/px · 1 of 29 slices shown (1 of 2)]
[im 1/29]
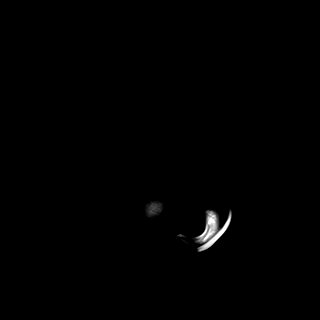

[Series 25: T1 post-contrast · coronal · 5.0mm · 0.34mm/px · 1 of 34 slices shown (2 of 2)]
[im 1/34]
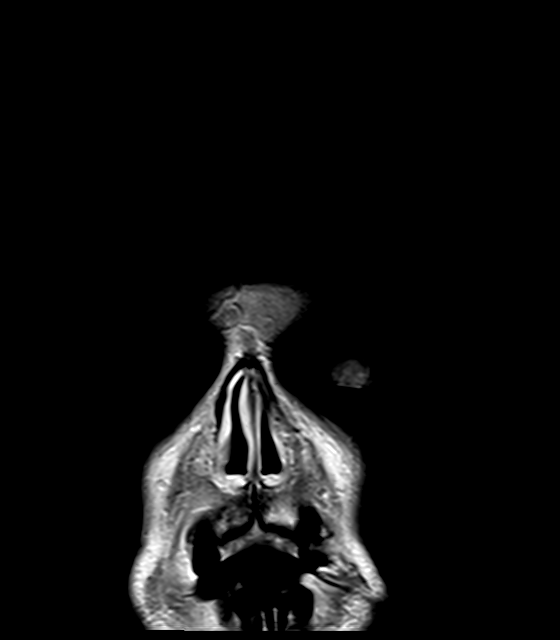

[41 of 48 positions shown; findings below may reference images not displayed]

FINDINGS: Brain: Restricted diffusion with ADC correlate in the left frontal
cortex diffusely (series 5, images 95-111), medial left thalamus
(series 5, image 94), left medial temporal lobe (series 5, image
91), left hippocampus (series 5, image 87), and right cerebellum
(series 5, images 73-84). These areas demonstrate increased T2
hyperintense signal and gyral swelling.

No acute hemorrhage, mass, mass effect, or midline shift.
Encephalomalacia in the left anterior inferior medial frontal lobe
(series 17, image 88), which is associated with susceptibility,
possibly a prior hemorrhagic infarct or sequela of trauma. No
abnormal parenchymal or meningeal enhancement.

No hydrocephalus or extra-axial collection. Dural calcifications, as
seen on the prior CT (series 17, image 125, for example); these do
not demonstrate significant enhancement and are not favored to
represent dural-based lesions.

Hippocampal asymmetry, secondary to gyral swelling in the left
hippocampus. No evidence of heterotopia or cortical dysgenesis.

Vascular: Normal flow voids.

Skull and upper cervical spine: Normal marrow signal.

Sinuses/Orbits: Fluid throughout the paranasal sinuses and nasal
cavity, which may be related to intubation. Dysconjugate gaze with
left staphyloma.

Other: Fluid in the bilateral mastoid air cells. Fluid collection
subjacent to the right parietal and occipital scalp
IMPRESSION: 1. Restricted diffusion and edema in the left frontal cortex, left
thalamus, medial left temporal lobe, left hippocampus, and right
cerebellum, most likely related to seizure activity, without
definite seizure etiology identified. These are not felt to
represent infarcts given lack conformation to a vascular territory.
2. Redemonstrated fluid collection subjacent to the right
parietooccipital scalp.

## 2021-12-25 IMAGING — DX DG ABDOMEN 1V
1 series · 1 of 1 positions shown · non-contrast
Comparison: None.

CLINICAL DATA: Found down.  NGT placement.

EXAM:
ABDOMEN - 1 VIEW

[abdomen kub]
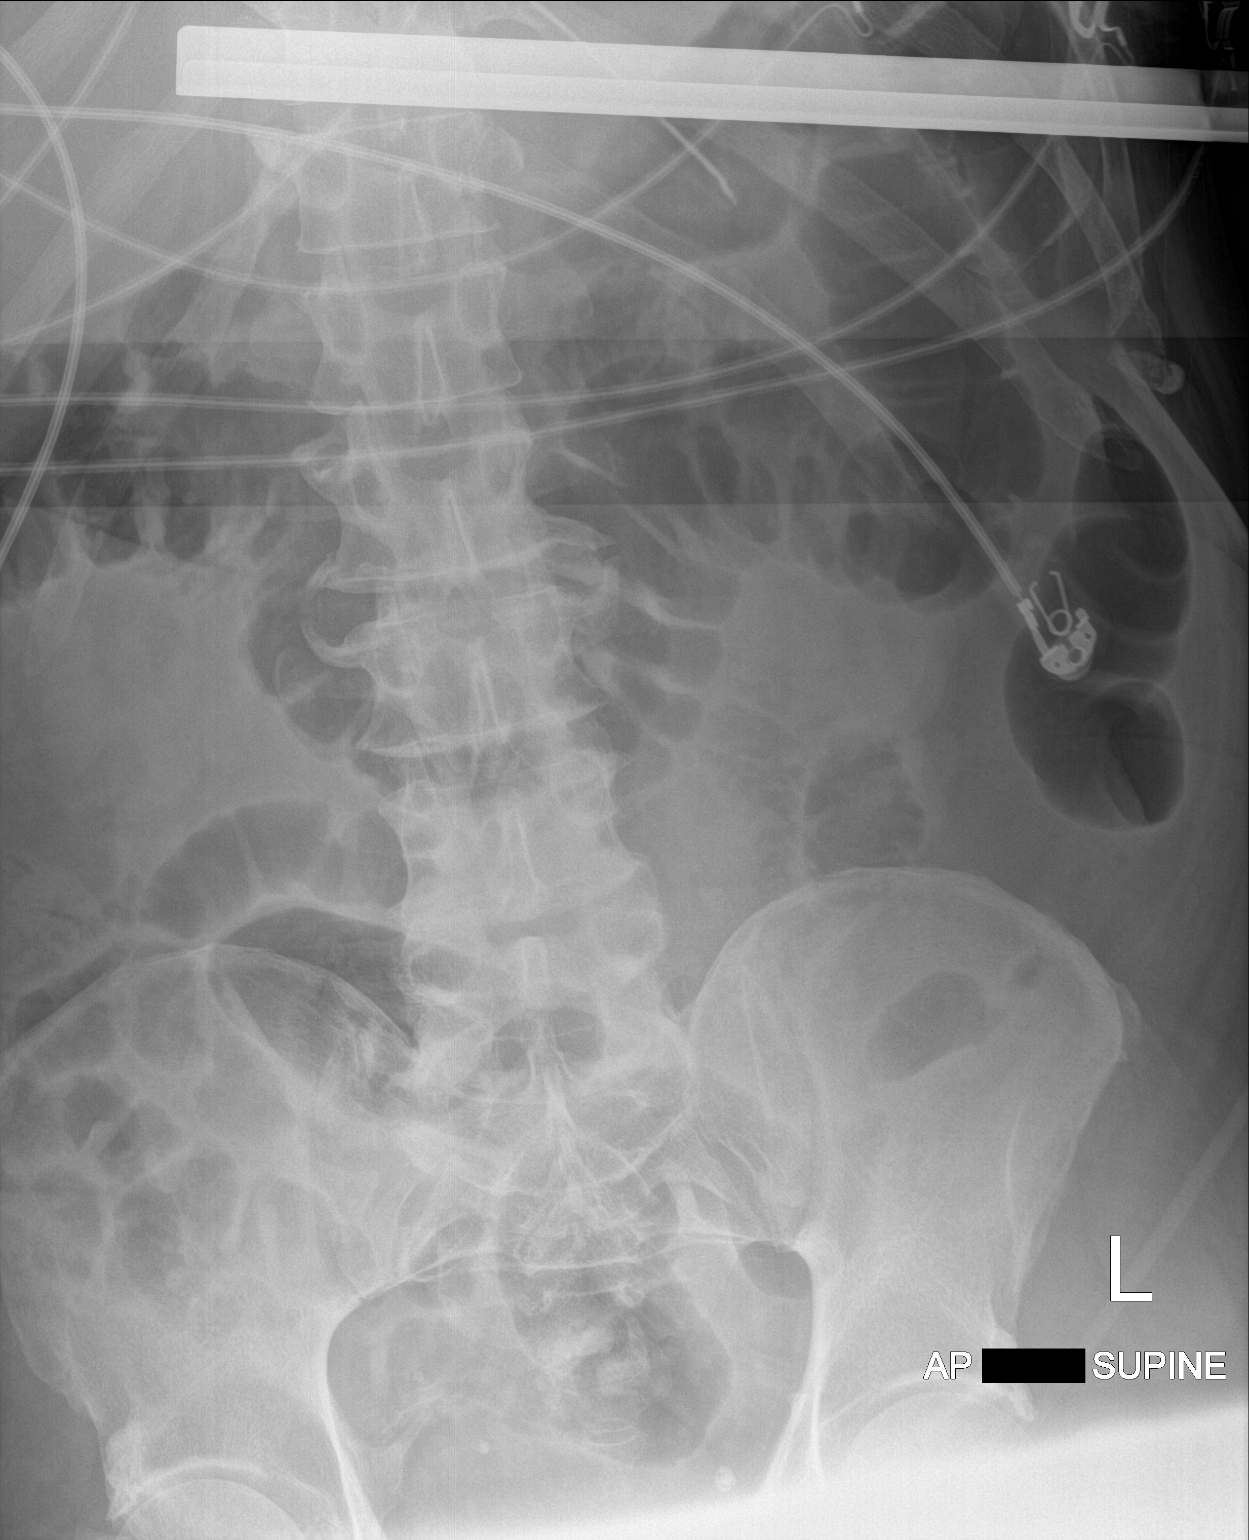

[1 of 1 positions shown; findings below may reference images not displayed]

FINDINGS: The tip of the NG tube is in the body region of the stomach.
Scattered air in the small bowel and colon could suggest an
early/mild ileus.
IMPRESSION: NG tube in good position.

## 2021-12-25 IMAGING — MR MR CERVICAL SPINE WO/W CM
6 of 9 series · 28 of 48 positions shown · IV contrast (gadavist)
Comparison: No prior MRI correlation is made with [DATE] CT
cervical spine

CLINICAL DATA: Myelopathy, acute

EXAM:
MRI CERVICAL SPINE WITHOUT AND WITH CONTRAST
TECHNIQUE: Multiplanar and multiecho pulse sequences of the cervical spine, to
include the craniocervical junction and cervicothoracic junction,
were obtained without and with intravenous contrast.
CONTRAST:  10mL GADAVIST GADOBUTROL 1 MMOL/ML IV SOLN

[Series 9: T2 · sagittal · 3.0mm · 0.69mm/px · 4 of 18 slices shown (1 of 2)]
[im 1/18]
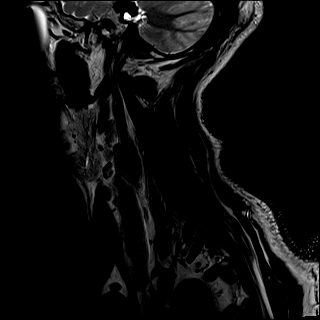
[im 6/18]
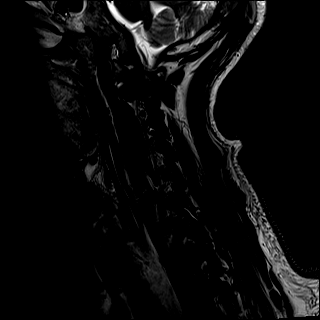
[im 12/18]
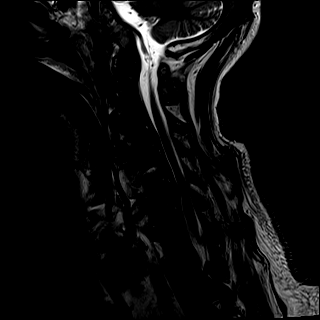
[im 18/18]
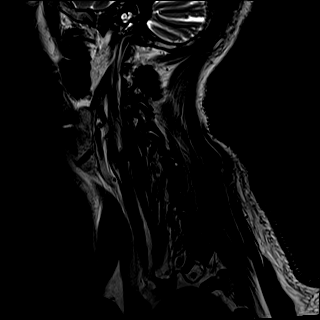

[Series 10: T1 · sagittal · 3.0mm · 0.69mm/px · 3 of 18 slices shown (1 of 3)]
[im 1/18]
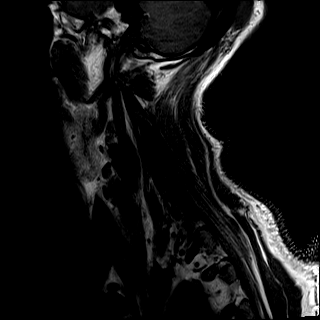
[im 9/18]
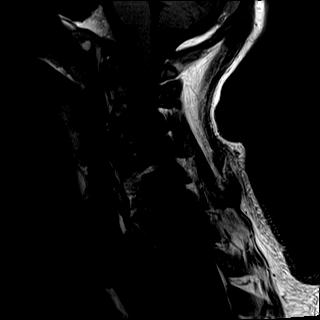
[im 18/18]
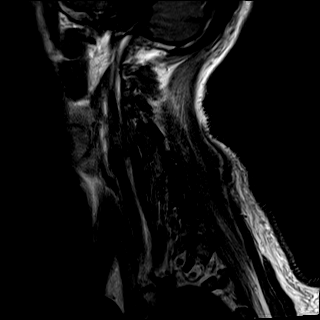

[Series 12: T2 · axial · 3.0mm · 0.62mm/px · z∈[-257,-89]mm · 8 of 53 slices shown (2 of 2)]
[im 1/53]
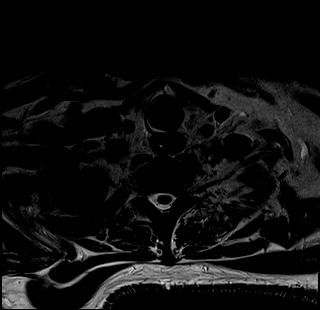
[im 8/53]
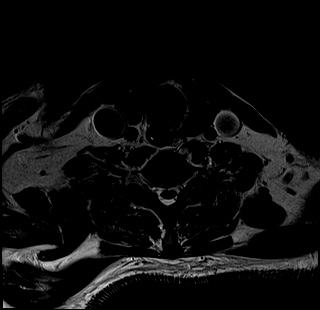
[im 15/53]
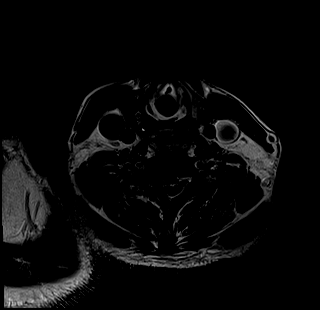
[im 23/53]
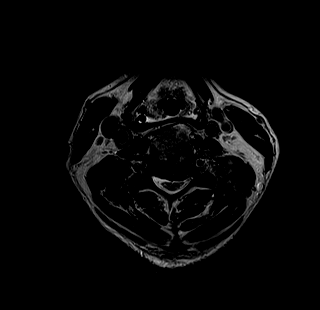
[im 30/53]
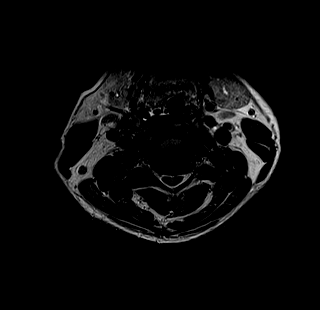
[im 38/53]
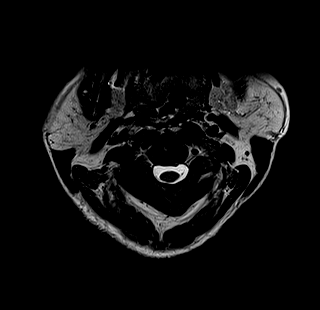
[im 45/53]
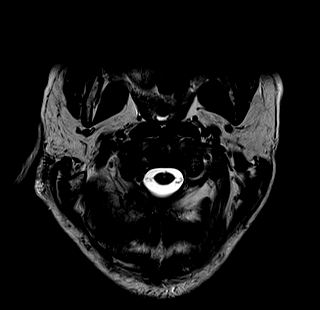
[im 53/53]
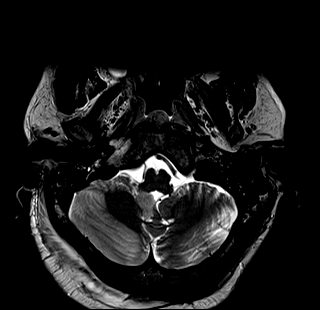

[Series 14: T1 · axial · 3.0mm · 0.39mm/px · z∈[-258,-90]mm · 8 of 53 slices shown (2 of 3)]
[im 1/53]
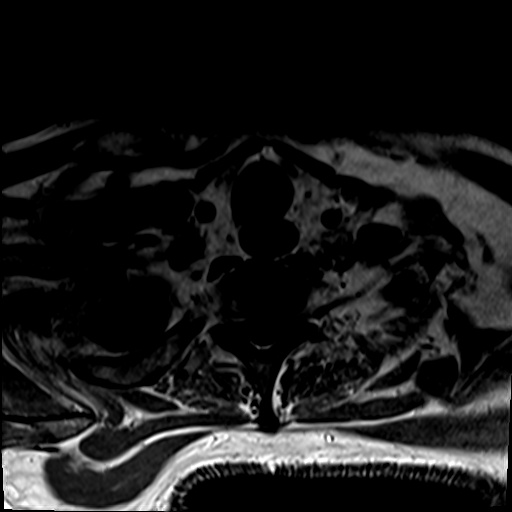
[im 8/53]
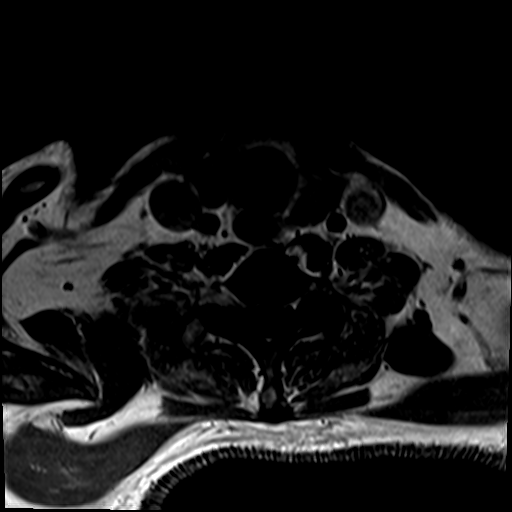
[im 15/53]
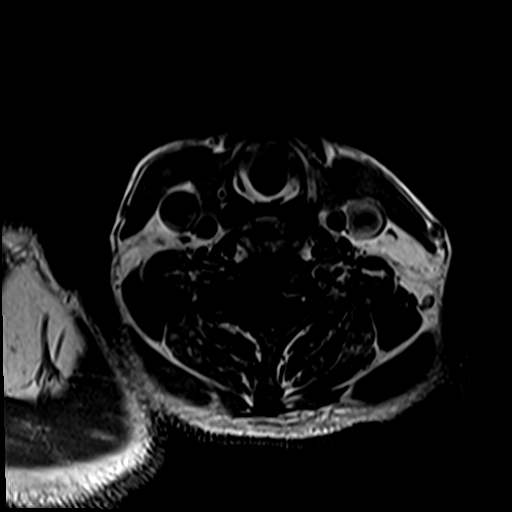
[im 23/53]
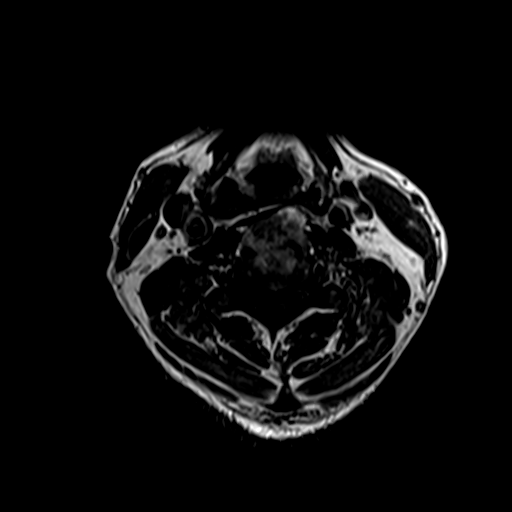
[im 30/53]
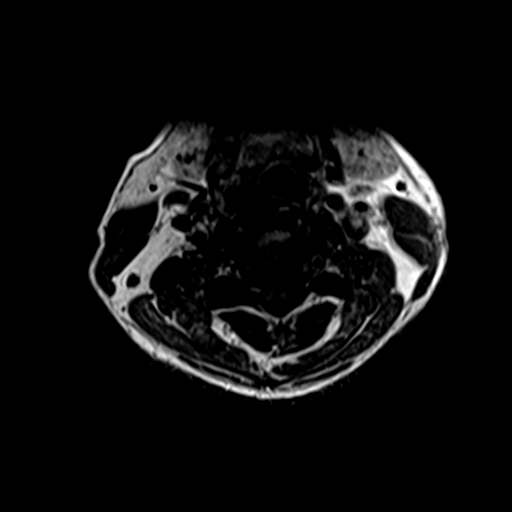
[im 38/53]
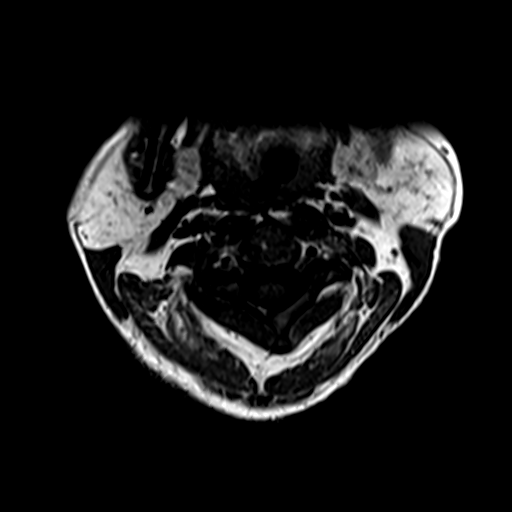
[im 45/53]
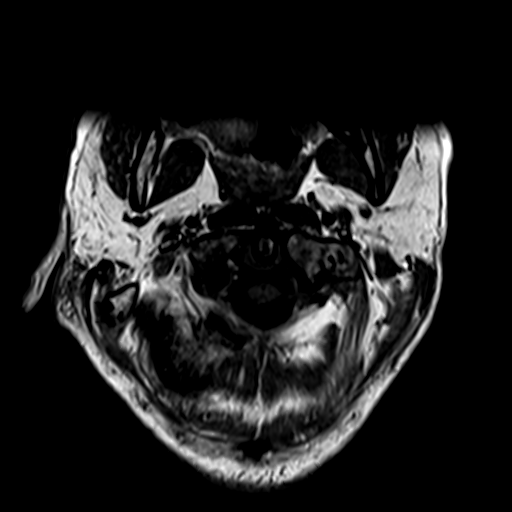
[im 53/53]
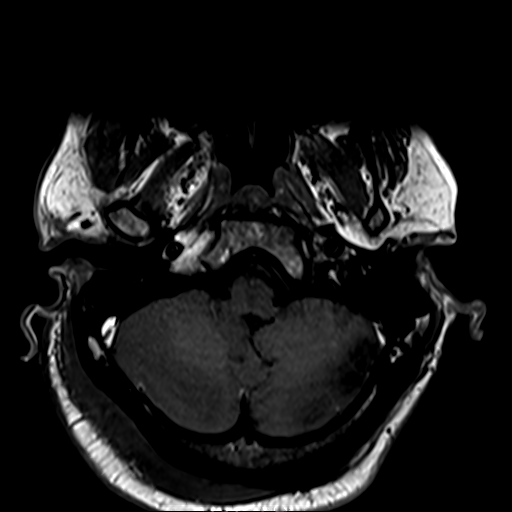

[Series 15: T1 · sagittal · 3.0mm · 0.69mm/px · 3 of 18 slices shown (3 of 3)]
[im 1/18]
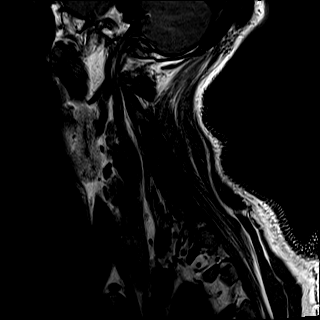
[im 9/18]
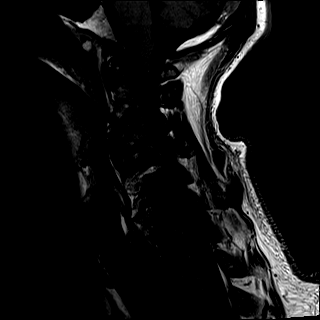
[im 18/18]
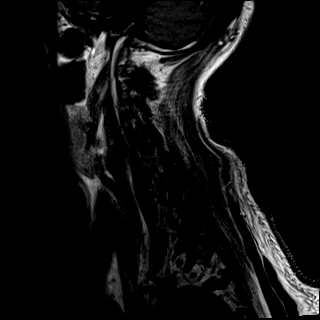

[Series 16: T1 fat-sat post-contrast · sagittal · 3.0mm · 0.43mm/px · 2 of 18 slices shown]
[im 1/18]
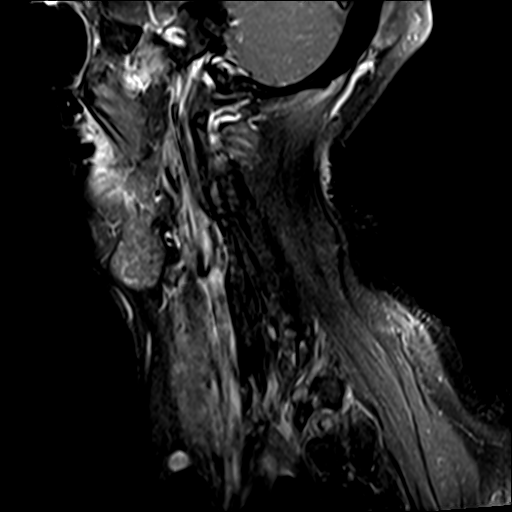
[im 9/18]
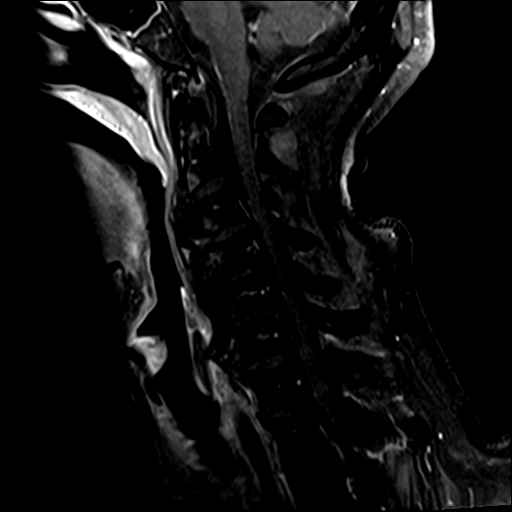

[28 of 48 positions shown; findings below may reference images not displayed]

FINDINGS: Alignment: Straightening of the normal cervical lordosis.

Vertebrae: No acute fracture or suspicious osseous lesion. Flowing
osteophytes extending from C2 through C6, as can be seen with DISH.
No abnormal enhancement. Congenitally short pedicles, which narrow
the AP diameter of the spinal canal.

Cord: Focal areas of increased T2 signal in the bilateral lateral
aspects of the cord (series 12, image 31), with mildly decreased
cord caliber C3-C4 to C5-C6. The spinal cord is otherwise normal in
caliber and signal. No abnormal enhancement.

Posterior Fossa, vertebral arteries, paraspinal tissues: Negative.

Disc levels:

C2-C3: No significant disc bulge. Left-greater-than-right facet
arthropathy and right-greater-than-left uncovertebral hypertrophy.
No spinal canal stenosis. Mild-to-moderate right neuroforaminal
narrowing.

C3-C4: Mild disc bulge. Facet and uncovertebral hypertrophy.
Mild-to-moderate spinal canal stenosis. Moderate bilateral neural
foraminal narrowing.

C4-C5: Minimal disc bulge. Facet and uncovertebral hypertrophy.
Mild-to-moderate spinal canal stenosis. Severe left and moderate
right neural foraminal narrowing.

C5-C6: Disc bulge with central disc protrusion, which abuts the
ventral cord. Moderate spinal canal stenosis. Uncovertebral and
facet arthropathy. Moderate left and moderate to severe right neural
foraminal narrowing.

C6-C7: No significant disc bulge. No spinal canal stenosis.
Uncovertebral and facet arthropathy. Mild left and severe right
neural foraminal narrowing.

C7-T1: No significant disc bulge. Right-greater-than-left
uncovertebral and left-greater-than-right facet arthropathy. No
spinal canal stenosis. Mild bilateral neural foraminal narrowing.
IMPRESSION: 1. Multilevel spinal canal stenosis, secondary to degenerative
changes superimposed on congenitally short pedicles, with an area of
mildly decreased spinal cord caliber extending from C3-C4 to C5-C6
and focally increased T2 signal of the lateral aspects of the cord
at C4-C5, possibly representing compressive myelopathy. No abnormal
spinal cord enhancement.
2. C5-C6 moderate spinal canal stenosis with moderate to severe
right and moderate left neural foraminal narrowing.
3. C4-C5 mild-to-moderate spinal canal stenosis with severe left and
moderate right neural foraminal narrowing.
4. C3-C4 mild-to-moderate spinal canal stenosis with moderate
bilateral neural foraminal narrowing.
5. C6-C7 severe right and mild left neural foraminal narrowing.

## 2021-12-25 IMAGING — CT CT ANGIO HEAD-NECK (W OR W/O PERF)
1 of 8 series · 14 of 47 positions shown · non-contrast
Comparison: None.

CLINICAL DATA: Patient found down trauma

EXAM:
CT ANGIOGRAPHY HEAD AND NECK
TECHNIQUE: Multidetector CT imaging of the head and neck was performed using
the standard protocol during bolus administration of intravenous
contrast. Multiplanar CT image reconstructions and MIPs were
obtained to evaluate the vascular anatomy. Carotid stenosis
measurements (when applicable) are obtained utilizing NASCET
criteria, using the distal internal carotid diameter as the
denominator.

[Series 7: thin · axial · 0.55mm/px · z∈[-370,-18]mm · 14 of 813 slices shown]
[im 55/813  brain]
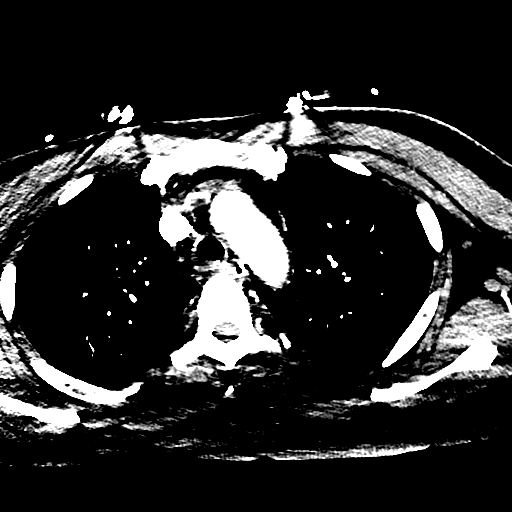
[im 109/813  bone]
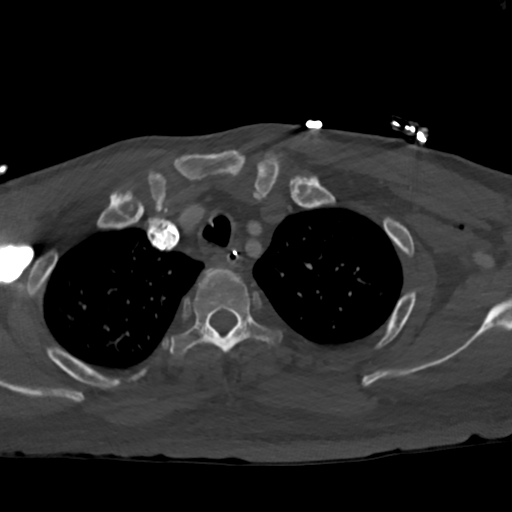
[im 163/813  brain]
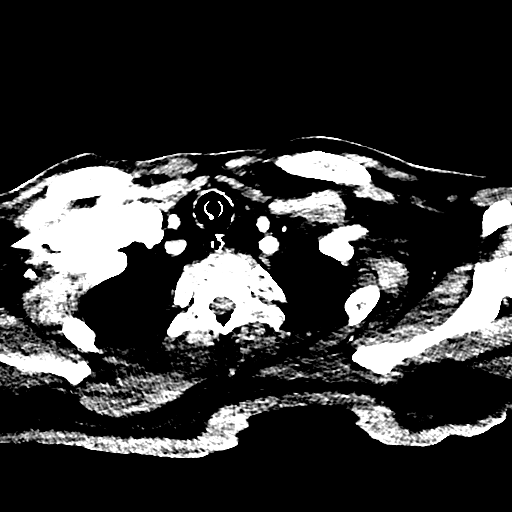
[im 217/813  bone]
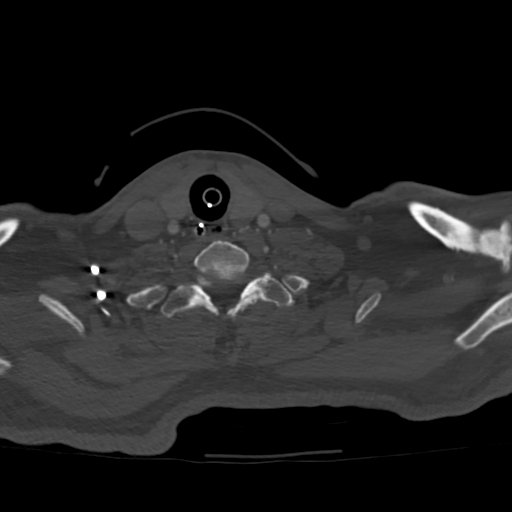
[im 271/813  brain]
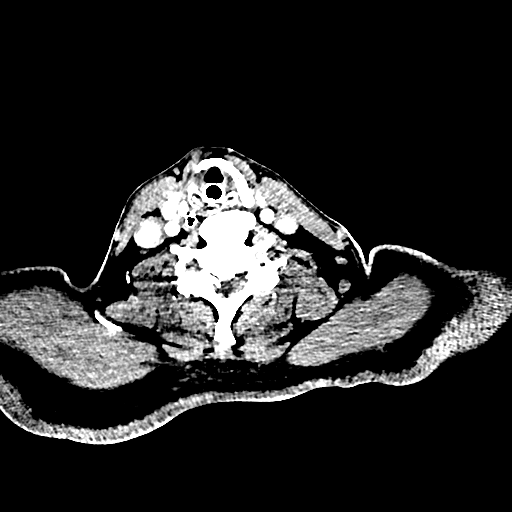
[im 325/813  bone]
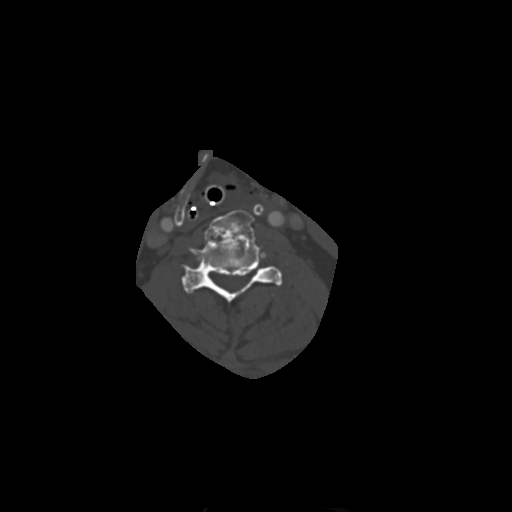
[im 379/813  brain]
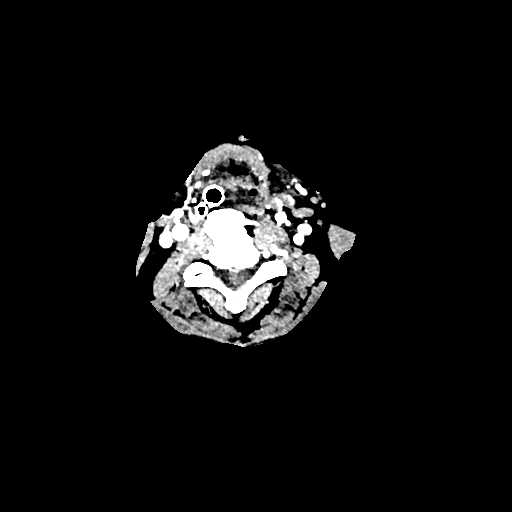
[im 434/813  bone]
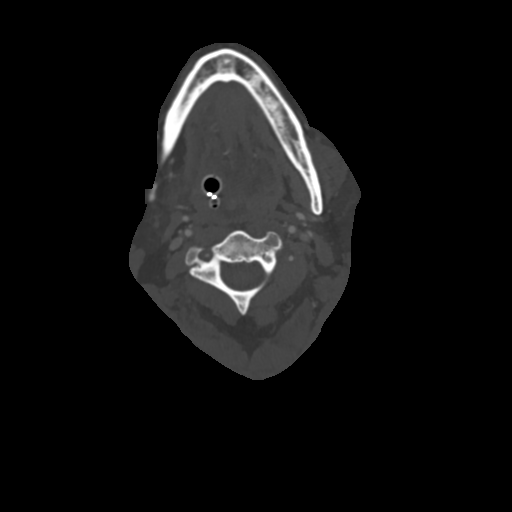
[im 488/813  brain]
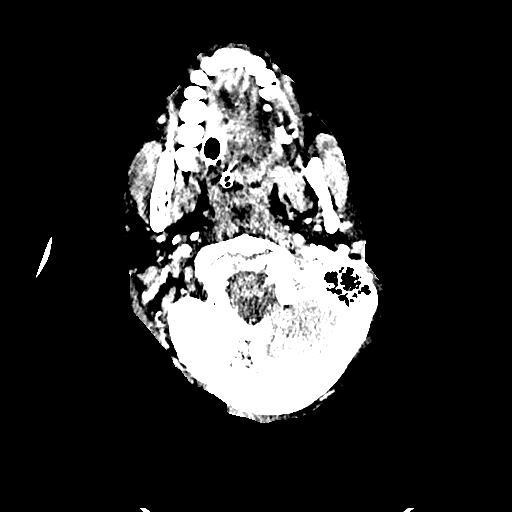
[im 542/813  bone]
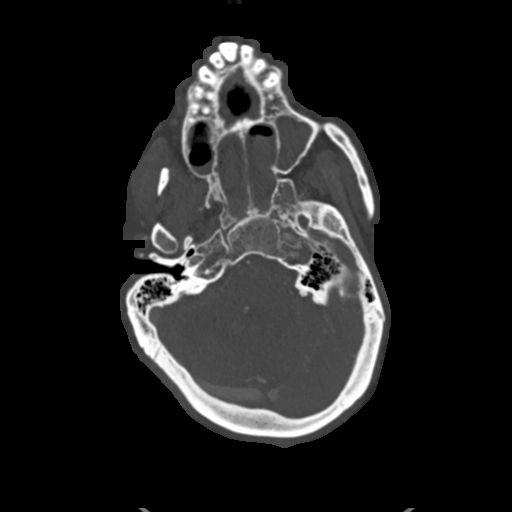
[im 596/813  brain]
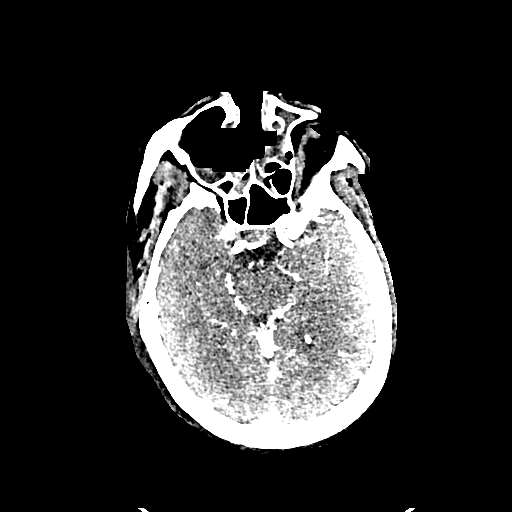
[im 650/813  bone]
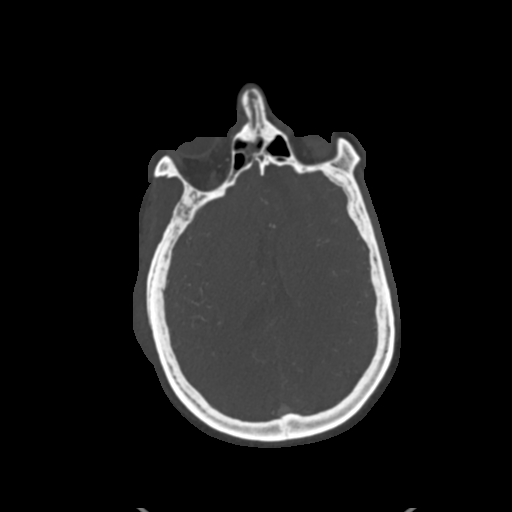
[im 704/813  brain]
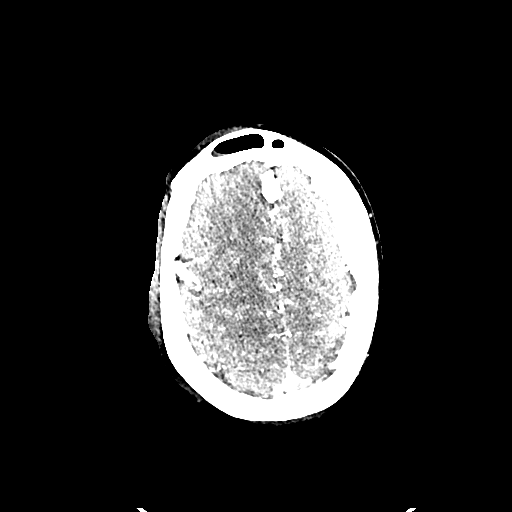
[im 758/813  bone]
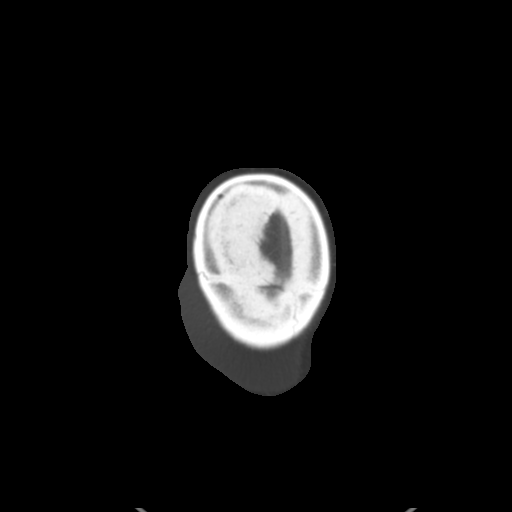

[14 of 47 positions shown; findings below may reference images not displayed]

RADIATION DOSE REDUCTION: This exam was performed according to the
departmental dose-optimization program which includes automated
exposure control, adjustment of the mA and/or kV according to
patient size and/or use of iterative reconstruction technique.

CONTRAST:  100mL OMNIPAQUE IOHEXOL 350 MG/ML SOLN
FINDINGS: Brain: There is no acute intracranial hemorrhage, extra-axial fluid
collection, or acute infarct

Parenchymal volume is normal. The ventricles are normal in size.
There is a small area of encephalomalacia in the left anterior
frontal lobe. Gray-white differentiation is otherwise preserved.
There are prominent dural calcifications overlying both cerebral
hemispheres. There is no suspicious mass lesion. There is no mass
effect or midline shift.

Vascular: No hyperdense vessel or unexpected calcification.

Skull: There is no calvarial fracture.

Sinuses/Orbits: There is fluid throughout the sinonasal cavity which
may be related to instrumentation. There is a dysconjugate gaze.

Other: There is a moderate amount of fluid throughout the right
scalp.

CTA NECK FINDINGS

Aortic arch: The imaged aortic arch is normal. The origins of the
major branch vessels are patent. The subclavian arteries are patent.

Right carotid system: The right common, internal, and external
carotid arteries are patent, without hemodynamically significant
stenosis or occlusion. There is no dissection or aneurysm.

Left carotid system: The left common, internal, and external carotid
arteries are patent, without hemodynamically significant stenosis or
occlusion. There is no dissection or aneurysm.

Vertebral arteries: The vertebral arteries are patent, without
hemodynamically significant stenosis or occlusion. There is no
dissection or aneurysm. The left vertebral artery is dominant.

Skeleton: There is multilevel degenerative change of the cervical
spine, assessed on the separately dictated cervical spine CT. There
is no suspicious osseous lesion.

Other neck: Soft tissues are unremarkable.

Upper chest: The lungs are assessed on the separately dictated CT
chest. The endotracheal tube terminates in the midthoracic trachea.
There is debris in the imaged trachea.

Review of the MIP images confirms the above findings

CTA HEAD FINDINGS

Anterior circulation: The intracranial ICAs are patent

The bilateral MCAs are patent

The bilateral ACAs are patent. The anterior communicating artery is
normal.

There is no aneurysm or AVM.

Posterior circulation: The bilateral V4 segments are patent,
diminutive on the right. The right PICA origin is not well seen. The
left PICA origin is normal. The basilar artery is patent but
diminutive in caliber.

The bilateral PCAs are patent, with supply coming from basilar
artery as well as prominent posterior communicating arteries
bilaterally.

There is no aneurysm or AVM.

Venous sinuses: Patent.

Anatomic variants: As above.

Review of the MIP images confirms the above findings
IMPRESSION: CT HEAD:

1. No acute intracranial pathology.
2. Small area of encephalomalacia in the left anterior frontal lobe
may reflect sequela of prior trauma.
3. Moderate amount of fluid throughout the right scalp.
4. Dysconjugate gaze.

CTA head/neck:

1. Patent vasculature of the head and neck with no hemodynamically
significant stenosis, occlusion, or dissection.
2. Please also refer to the separately dictated CT cervical spine
and CT chest/abdomen/pelvis.

## 2021-12-25 IMAGING — DX DG PORTABLE PELVIS
1 series · 1 of 1 positions shown · non-contrast
Comparison: CT scan from [ZE]

CLINICAL DATA: Found down.

EXAM:
PORTABLE PELVIS 1-2 VIEWS

[pelvis ap]
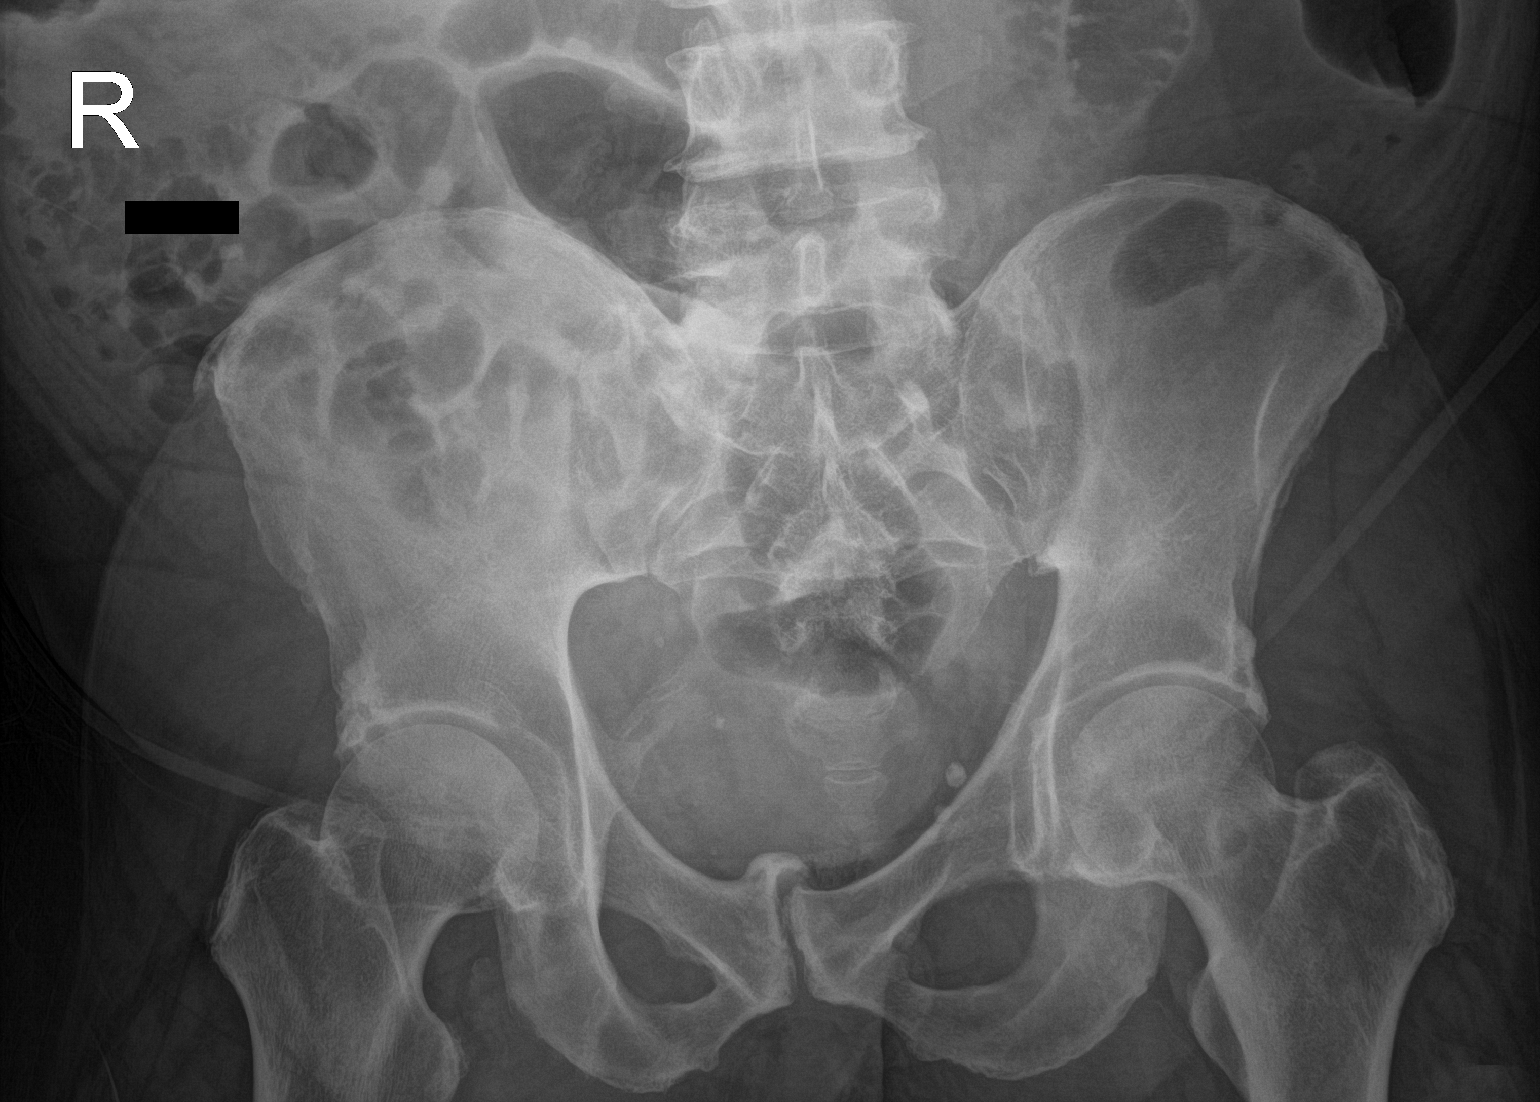

[1 of 1 positions shown; findings below may reference images not displayed]

FINDINGS: Chronic degenerative changes at pubic symphysis with moderate
spurring. There are also moderate bilateral hip joint degenerative
changes. No acute fractures are identified.
IMPRESSION: 1. No acute bony findings.
2. Chronic degenerative changes.

## 2021-12-25 MED ORDER — PROPOFOL 10 MG/ML IV BOLUS: 0.5000 mg/kg | Freq: Once | INTRAVENOUS | Status: DC

## 2021-12-25 MED ORDER — ASPIRIN 325 MG PO TABS: 325.0000 mg | ORAL_TABLET | Freq: Once | ORAL | Status: DC

## 2021-12-25 MED ORDER — ASPIRIN 300 MG RE SUPP: 300.0000 mg | RECTAL | Status: AC

## 2021-12-25 MED ORDER — VANCOMYCIN HCL IN DEXTROSE 1-5 GM/200ML-% IV SOLN: 1000.0000 mg | Freq: Once | INTRAVENOUS | Status: DC

## 2021-12-25 MED ORDER — LACTATED RINGERS IV BOLUS (SEPSIS): 500.0000 mL | Freq: Once | INTRAVENOUS | Status: DC

## 2021-12-25 MED ORDER — SODIUM CHLORIDE 0.9 % IV SOLN: INTRAVENOUS | Status: DC | PRN

## 2021-12-25 MED ORDER — PANTOPRAZOLE 2 MG/ML SUSPENSION
40.0000 mg | Freq: Every day | ORAL | Status: DC
  Filled 2021-12-25: qty 20

## 2021-12-25 MED ORDER — CHLORHEXIDINE GLUCONATE 0.12% ORAL RINSE (MEDLINE KIT)
15.0000 mL | Freq: Two times a day (BID) | OROMUCOSAL | Status: DC
  Administered 2021-12-25 – 2022-01-11 (×34): 15 mL via OROMUCOSAL

## 2021-12-25 MED ORDER — LORAZEPAM 2 MG/ML IJ SOLN
4.0000 mg | INTRAMUSCULAR | Status: DC | PRN
  Administered 2022-01-02: 2 mg via INTRAVENOUS
  Filled 2021-12-25 (×2): qty 2

## 2021-12-25 MED ORDER — POLYETHYLENE GLYCOL 3350 17 G PO PACK: 17.0000 g | PACK | Freq: Every day | ORAL | Status: DC | PRN

## 2021-12-25 MED ORDER — LACTATED RINGERS IV BOLUS
1000.0000 mL | Freq: Once | INTRAVENOUS | Status: AC
  Administered 2021-12-25: 1000 mL via INTRAVENOUS

## 2021-12-25 MED ORDER — VANCOMYCIN HCL 1500 MG/300ML IV SOLN
1500.0000 mg | INTRAVENOUS | Status: DC
  Filled 2021-12-25: qty 300

## 2021-12-25 MED ORDER — DOCUSATE SODIUM 100 MG PO CAPS: 100.0000 mg | ORAL_CAPSULE | Freq: Two times a day (BID) | ORAL | Status: DC | PRN

## 2021-12-25 MED ORDER — LACTATED RINGERS IV BOLUS (SEPSIS): 1000.0000 mL | Freq: Once | INTRAVENOUS | Status: DC

## 2021-12-25 MED ORDER — FENTANYL CITRATE (PF) 100 MCG/2ML IJ SOLN
50.0000 ug | INTRAMUSCULAR | Status: DC | PRN
Start: 2021-12-25 — End: 2021-12-31
  Administered 2021-12-30: 50 ug via INTRAVENOUS
  Filled 2021-12-25 (×2): qty 2

## 2021-12-25 MED ORDER — SODIUM CHLORIDE 0.9 % IV SOLN
3000.0000 mg | Freq: Once | INTRAVENOUS | Status: AC
  Administered 2021-12-25: 3000 mg via INTRAVENOUS
  Filled 2021-12-25: qty 30

## 2021-12-25 MED ORDER — ORAL CARE MOUTH RINSE
15.0000 mL | OROMUCOSAL | Status: DC
  Administered 2021-12-25 – 2022-01-11 (×168): 15 mL via OROMUCOSAL

## 2021-12-25 MED ORDER — ENOXAPARIN SODIUM 40 MG/0.4ML IJ SOSY: 40.0000 mg | PREFILLED_SYRINGE | INTRAMUSCULAR | Status: DC

## 2021-12-25 MED ORDER — PANTOPRAZOLE SODIUM 40 MG IV SOLR
40.0000 mg | Freq: Every day | INTRAVENOUS | Status: DC
  Administered 2021-12-25: 40 mg via INTRAVENOUS
  Filled 2021-12-25: qty 10

## 2021-12-25 MED ORDER — VANCOMYCIN HCL 2000 MG/400ML IV SOLN
2000.0000 mg | Freq: Once | INTRAVENOUS | Status: AC
  Administered 2021-12-25: 2000 mg via INTRAVENOUS
  Filled 2021-12-25: qty 400

## 2021-12-25 MED ORDER — SODIUM CHLORIDE 0.9 % IV SOLN
75.0000 mL/h | INTRAVENOUS | Status: DC
  Administered 2021-12-25: 75 mL/h via INTRAVENOUS

## 2021-12-25 MED ORDER — METRONIDAZOLE 500 MG/100ML IV SOLN
500.0000 mg | Freq: Once | INTRAVENOUS | Status: AC
  Administered 2021-12-25: 500 mg via INTRAVENOUS
  Filled 2021-12-25: qty 100

## 2021-12-25 MED ORDER — PROPOFOL 1000 MG/100ML IV EMUL
0.0000 ug/kg/min | INTRAVENOUS | Status: DC
  Administered 2021-12-26 (×3): 50 ug/kg/min via INTRAVENOUS
  Administered 2021-12-26: 25 ug/kg/min via INTRAVENOUS
  Administered 2021-12-26: 35 ug/kg/min via INTRAVENOUS
  Administered 2021-12-27 – 2021-12-28 (×11): 50 ug/kg/min via INTRAVENOUS
  Filled 2021-12-25 (×17): qty 100

## 2021-12-25 MED ORDER — LACTATED RINGERS IV SOLN: INTRAVENOUS | Status: DC

## 2021-12-25 MED ORDER — SODIUM CHLORIDE 0.9 % IV SOLN
2.0000 g | Freq: Three times a day (TID) | INTRAVENOUS | Status: DC
  Administered 2021-12-25 – 2021-12-26 (×2): 2 g via INTRAVENOUS
  Filled 2021-12-25 (×2): qty 12.5

## 2021-12-25 MED ORDER — CHLORHEXIDINE GLUCONATE CLOTH 2 % EX PADS
6.0000 | MEDICATED_PAD | Freq: Every day | CUTANEOUS | Status: DC
  Administered 2021-12-25 – 2022-01-13 (×23): 6 via TOPICAL

## 2021-12-25 MED ORDER — IOHEXOL 350 MG/ML SOLN
100.0000 mL | Freq: Once | INTRAVENOUS | Status: AC | PRN
  Administered 2021-12-25: 100 mL via INTRAVENOUS

## 2021-12-25 MED ORDER — SODIUM CHLORIDE 0.9 % IV SOLN
2.0000 g | Freq: Once | INTRAVENOUS | Status: AC
  Administered 2021-12-25: 2 g via INTRAVENOUS
  Filled 2021-12-25: qty 12.5

## 2021-12-25 MED ORDER — LACTATED RINGERS IV BOLUS (SEPSIS)
1000.0000 mL | Freq: Once | INTRAVENOUS | Status: AC
  Administered 2021-12-25: 1000 mL via INTRAVENOUS

## 2021-12-25 MED ORDER — PROPOFOL 1000 MG/100ML IV EMUL
0.0000 ug/kg/min | INTRAVENOUS | Status: DC
  Administered 2021-12-25: 15 ug/kg/min via INTRAVENOUS
  Filled 2021-12-25: qty 100

## 2021-12-25 MED ORDER — INSULIN ASPART 100 UNIT/ML IJ SOLN
2.0000 [IU] | INTRAMUSCULAR | Status: DC
  Administered 2021-12-25: 4 [IU] via SUBCUTANEOUS
  Administered 2021-12-26 (×2): 2 [IU] via SUBCUTANEOUS
  Administered 2021-12-26 – 2021-12-27 (×8): 4 [IU] via SUBCUTANEOUS
  Administered 2021-12-27: 2 [IU] via SUBCUTANEOUS
  Administered 2021-12-28 (×2): 4 [IU] via SUBCUTANEOUS
  Administered 2021-12-28 (×2): 2 [IU] via SUBCUTANEOUS
  Administered 2021-12-28: 4 [IU] via SUBCUTANEOUS
  Administered 2021-12-28: 2 [IU] via SUBCUTANEOUS
  Administered 2021-12-28 – 2021-12-29 (×2): 4 [IU] via SUBCUTANEOUS
  Administered 2021-12-29: 3 [IU] via SUBCUTANEOUS
  Administered 2021-12-29: 2 [IU] via SUBCUTANEOUS
  Administered 2021-12-29: 6 [IU] via SUBCUTANEOUS
  Administered 2021-12-29: 4 [IU] via SUBCUTANEOUS
  Administered 2021-12-29: 2 [IU] via SUBCUTANEOUS
  Administered 2021-12-30: 4 [IU] via SUBCUTANEOUS
  Administered 2021-12-30 (×2): 2 [IU] via SUBCUTANEOUS
  Administered 2021-12-30 – 2021-12-31 (×2): 4 [IU] via SUBCUTANEOUS
  Administered 2021-12-31: 2 [IU] via SUBCUTANEOUS
  Administered 2021-12-31: 4 [IU] via SUBCUTANEOUS
  Administered 2022-01-01 – 2022-01-02 (×4): 2 [IU] via SUBCUTANEOUS
  Administered 2022-01-02 (×2): 4 [IU] via SUBCUTANEOUS
  Administered 2022-01-02 – 2022-01-13 (×17): 2 [IU] via SUBCUTANEOUS

## 2021-12-25 MED ORDER — LEVETIRACETAM IN NACL 1000 MG/100ML IV SOLN
1000.0000 mg | Freq: Two times a day (BID) | INTRAVENOUS | Status: DC
  Administered 2021-12-26 – 2021-12-30 (×9): 1000 mg via INTRAVENOUS
  Filled 2021-12-25 (×10): qty 100

## 2021-12-25 MED ORDER — ASPIRIN 81 MG PO CHEW
324.0000 mg | CHEWABLE_TABLET | ORAL | Status: AC
  Administered 2021-12-25: 324 mg via ORAL
  Filled 2021-12-25: qty 4

## 2021-12-25 NOTE — ED Notes (Signed)
Propofol starting at 10 ?

## 2021-12-25 NOTE — Progress Notes (Signed)
eLink Physician-Brief Progress Note ?Patient Name: Victor Erickson ?DOB: [birth year] ?MRN: [id number] ? ? ?Date of Service ? 12/25/2021  ?HPI/Events of Note ? Patient needs CBG and SSI coverage.  ?eICU Interventions ? CBG and SSI coverage ordered.  ? ? ? ?  ? ?Victor Erickson ?12/25/2021, 8:25 PM ?

## 2021-12-25 NOTE — Assessment & Plan Note (Signed)
Minor and static elevation troponin.  EKG shows shows stable J-point elevation.  Poor acoustic windows on point-of-care echo.  Patient has some degree of AKI. ? ?-Likely demand related injury from critical illness. ?-Formal echocardiogram pending ?-ASA only. ?

## 2021-12-25 NOTE — H&P (Addendum)
Critical care attending attestation note: ? ?Patient seen and examined and relevant ancillary tests reviewed.  I agree with the assessment and plan of care as outlined by Excell Seltzer, MS IV.  ? ?Synopsis of assessment and plan: ? ?61 year old man who was last seen 48 hours ago.  History of binge drinking.  Recent marital break-up.  Found down at home on home health check.  Significant weight loss over the last several months. ? ?Intubated for airway protection.  On examination, subconjunctival hemorrhages both eyes.  Pupils equal and reactive.  Moves left upper extremity only spontaneously but not to command.  No response to pain in the other extremities.  Chest clear heart sounds unremarkable.  Patient is incontinent of urine.  Abdomen is soft and nontender. ? 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  Not following commands.  Purposeful movements on left extremity only. ?EEG showed 1 epoch of seizure activity. ? ?-Continue propofol for seizure suppression ?-Load with Keppra.  Follow renal function and adjust dosing accordingly. ?-MRI to evaluate cause of status.  May be due to alcohol withdrawal. ? ?Acute respiratory failure with hypoxia ([place]) ?Intubated for airway protection.  There are infiltrates on chest x-ray.  Some groundglass opacification on CT. ? ?-Mechanical ventilation with full support until mental status improves. ?-Repeat chest x-ray in a.m. ?-Follow oxygenation for worsening as may indicate possible aspiration event. ? ?Myocardial injury ?Minor and static elevation troponin.  EKG shows shows stable J-point elevation.  Poor acoustic windows on point-of-care echo.  Patient has some degree of AKI. ? ?-Likely demand related injury from critical illness. ?-Formal echocardiogram pending ?-ASA only. ? ?AKI (acute kidney injury) ([place]) ?Creatinine has risen from 1.1-1.59. ? ?-Has received fluid resuscitation. ?-Follow serial  creatinine.  Avoid nephrotoxins at this time including contrast agents. ? ?Steatohepatitis, alcoholic ?Patient has history of heavy alcohol intake.  Elevated AST ALT. ? ?-Negative discriminant function.  Does not meet criteria for steroids for alcoholic hepatitis. ? ?Pressure injury of skin ?Superficial injuries on right temple, hip, knee from fall/immobility.  ? ?- Topical wound care ? ? ?CRITICAL CARE ?Performed by: Kipp Brood ? ? ?Total critical care time: 52 minutes ? ?Critical care time was exclusive of separately billable procedures and treating other patients. ? ?Critical care was necessary to treat or prevent imminent or life-threatening deterioration. ? ?Critical care was time spent personally by me on the following activities: development of treatment plan with patient and/or surrogate as well as nursing, discussions with consultants, evaluation of patient's response to treatment, examination of patient, obtaining history from patient or surrogate, ordering and performing treatments and interventions, ordering and review of laboratory studies, ordering and review of radiographic studies, pulse oximetry, re-evaluation of patient's condition and participation in multidisciplinary rounds. ? ?Kipp Brood, MD FRCPC ?ICU Physician ?[place]  ?Pager: [phone] ?Mobile: [phone] ?After hours: [phone]. ? ?12/25/2021, 5:39 PM ? ? ?

## 2021-12-25 NOTE — ED Notes (Signed)
Pt responding to painful stimuli on left side only, and is purposeful in movement on the left side when name is called.  ?

## 2021-12-25 NOTE — Procedures (Signed)
Patient Name: Victor Erickson  ?MRN: [id number]  ?Epilepsy Attending: Charlsie Quest  ?Referring Physician/Provider: Ernie Avena, MD ?Date: 12/25/2021 ?Duration: 27.44 mins ? ?Patient history: 61yo M alcoholic patient found down for 2 days, with labs concerning for STEMI, out of the window for STEMI activation, also presenting with full neurologic deficit concerning for stroke versus postictal Todd's paralysis.  ? ?Level of alertness: comatose ? ?AEDs during EEG study: propofol ? ?Technical aspects: This EEG study was done with scalp electrodes positioned according to the 10-20 International system of electrode placement. Electrical activity was acquired at a sampling rate of 500Hz  and reviewed with a high frequency filter of 70Hz  and a low frequency filter of 1Hz . EEG data were recorded continuously and digitally stored.  ? ?Description: EEG showed lateralized periodic discharges at 1 Hz in left hemisphere which appear rhythmic lasting 3-8 seconds alternating with 1-3 seconds of generalized EEG attenuation consistent with brief ictal-interictal rhythmic discharges ( BIRDs). EEG also showed intermittent generalized and lateralized left hemisphere low amplitude 3 to 6 Hz theta-delta slowing lasting 3-8 seconds admixed with eeg attenuation lasting 1-3 seconds.  ? ?One seizure without clinical signs was noted arising from left hemisphere on 12/25/2021 at 1715. EEG showed lateralized periodic discharges in left hemisphere at 1Hz  which then evolved in morphology and frequency worsened to 3hz . Total duration was about 40 seconds.  ? ?Hyperventilation and photic stimulation were not performed.    ? ?ABNORMALITY ?- Seizure without clinical signs, left hemisphere  ?- Brief ictal-interictal rhythmic discharges ( BIRDs), left hemisphere  ?- Intermittent slow, generalized and lateralized left hemisphere  ? ? ?IMPRESSION: ?This study showed one seizure without clinical signs arising from left hemisphere on 12/25/2021 at 1715,  lasting about 40 seconds. Additionally, brief ictal-interictal rhythmic discharges ( BIRDs) were noted in left hemisphere suggestive of high potential for seizure recurrence. Lastly, there is severe to profound diffuse encephalopathy, non specific etiology.  ? ?Dr and Dr 12/27/2021 were notified at 1720 ? ?[name]  ? ?

## 2021-12-25 NOTE — Assessment & Plan Note (Signed)
Creatinine has risen from 1.1-1.59. ? ?-Has received fluid resuscitation. ?-Follow serial creatinine.  Avoid nephrotoxins at this time including contrast agents. ?

## 2021-12-25 NOTE — ED Notes (Signed)
10 atomidate admin  ?

## 2021-12-25 NOTE — H&P (Signed)
? ?NAME:  Victor Erickson, MRN:  [id number], DOB:  26-May-1961, LOS: 0 ?ADMISSION DATE:  12/25/2021, CONSULTATION DATE:  12/25/21 ?REFERRING MD:  [facility], CHIEF COMPLAINT:  Found down, unresponsive  ? ?History of Present Illness:  ?History provided by patient's daughter. She states that no one had heard from patient for the past 3 days. The family called a welfare check and found the patient down and unresponsive. ? ?Daughter states that patient has a history of alcohol use, as long as she's known him he has been a heavy binge drinker. He is also going through a separation from her mother. States he has recently been saying he thinks he is going to have a heart attack, but she is unsure why he felt that way. Thinks he has lost about 30 pounds since the last time she saw him two months ago.  ? ?Pertinent  Medical History  ?HTN on amlodipine 10 mg daily ?HLD on rosuvastatin 10 mg daily ? ?Significant Hospital Events: ?Including procedures, antibiotic start and stop dates in addition to other pertinent events   ? ? ?Interim History / Subjective:  ? ?Objective   ?Blood pressure 118/87, pulse (!) 116, resp. rate (!) 23, height 6\' 1"  (1.854 m), weight 110 kg, SpO2 94 %. ?   ?Vent Mode: PRVC ?FiO2 (%):  [40 %-100 %] 40 % ?Set Rate:  [18 bmp] 18 bmp ?Vt Set:  [610 mL-630 mL] 630 mL ?PEEP:  [5 cmH20] 5 cmH20 ?Plateau Pressure:  [17 cmH20] 17 cmH20  ?No intake or output data in the 24 hours ending 12/25/21 1615 ?Filed Weights  ? 12/25/21 1116  ?Weight: 110 kg  ? ? ?Examination: ?General: Intubated, sedated,  ?HENT: Injected conjunctiva. Ecchymoses and erythema on R scalp above eyebrow ?Lungs: Rales at R anterior lung field ?Cardiovascular: Tachycardic rate, regular rhythm ?Neuro: Unresponsive to voice, does not follow commands. Purposeful movement on L side only. Unresponsive to pain bilaterally.  ? ?Resolved Hospital Problem list   ? ?Assessment & Plan:  ?Glenn Gullickson is a 61 year old man with a PMH of HTN, HLD, and  alcohol use who was found down unresponsive and was found to have rising troponins, AKI, transaminititis, leukocytosis with a left shift, and ground glass opacities on CT chest. Differential for his presentation includes septic shock, ACS, and seizure. Intracranial hemorrhage is less likely given negative CT head. Further workup will focus on ruling out acute cardiac pathology, treating potential infection, and providing supportive care.  ? ?# Respiratory Failure  C/f Septic Shock ?Patient with risk factors for aspiration pneumonia presents with tachycardia, hypotension, respiratory failure, and rales on exam. Workup notable for leukocytosis with left shift. CT chest with ground glass opacities suggestive of contusion or interstitial pneumonia. Will plan to resuscitate with IV fluids, cover with empiric antibiotics and follow up blood and urine cultures. ?- follow up blood, urine cultures ?- follow up respiratory panel ?- continue cefepime, vancomycin, metronidazole for empiric coverage ?- LR @ 30 mL/kg ?- intubated, sedated with propofol ? ?# Altered Mental Status ?Patient found unresponsive. On presentation, he has purposeful movements of left side with right side neglect. CT head with no acute intracerebral process. His presentation is most concerning for seizure/post-ictal status given his history of alcohol use and potential fall leading to withdrawal. Less concerned for acute bleed but will investigate further with MRI brain. ?- follow up MRI brain ?- follow up EEG ?- frequent neuro checks  ? ?# Type 2 NSTEMI ?Patient presents with rising troponin (4127 ->  [id number]) and abnormal EKG. However, ST segments do not qualify as elevated (>212mm) and repeat EKG is unchanged. Less concern for acute MI, more likely demand ischemia due to type 2 NSTEMI in the setting of septic shock. Will continue to trend troponin to peak and obtain echo to rule out cardiac pathology. ?- trend troponin to peak ?- follow up echocardiogram ?-  ASA 81 mg ?- resuscitate as above  ? ?# Acute Kidney Injury ?Creatinine elevated to 1.59 from baseline of 1.10. BUN/cr ratio of 17.6 indicates potentially a post-renal cause. Due to his clinical presentation, prerenal etiology is most likely given decreased PO intake and shock. ATN is also on the differential. Will resuscitate with IVF, and if worsening, consider further workup including bladder scan and urine studies. ?- resuscitate as above ?- can consider bladder scan, urine sodium, urine creatinine if worsening  ? ?# Transaminitis  Alcoholic Hepatitis ?Patient presents with AST 20x normal limit, ALT 4x normal limit. Most likely due to alcohol use, however may be components of ischemia in setting of shock as AST is more elevated than expected. Less concern for viral hepatitis given recent negative test with PCP. Maddrey score <32 indicates patient would not benefit from treatment with glucocorticoids. ?- continue to monitor ? ?# HTN: hold home amlodipine 10 mg daily ?# HLD: continue home rosuvastatin 10 mg daily ? ?Best Practice (right click and "Reselect all SmartList Selections" daily)  ? ?Diet/type: NPO ?DVT prophylaxis: LMWH ?GI prophylaxis: PPI ?Lines: N/A ?Foley:  Yes, and it is still needed ?Code Status:  full code ?Last date of multidisciplinary goals of care discussion [unknown] ? ?Labs   ?CBC: ?Recent Labs  ?Lab 12/25/21 ?1159 12/25/21 ?1256 12/25/21 ?1300  ?WBC  --   --  16.2*  ?NEUTROABS  --   --  13.4*  ?HGB 17.7* 16.0 16.1  ?HCT 52.0 47.0 50.4  ?MCV  --   --  98.2  ?PLT  --   --  167  ? ? ?Basic Metabolic Panel: ?Recent Labs  ?Lab 12/25/21 ?1159 12/25/21 ?1256 12/25/21 ?1300  ?NA 141 142 141  ?K 4.9 3.2* 3.9  ?CL 112*  --  107  ?CO2  --   --  18*  ?GLUCOSE 191*  --  177*  ?BUN 41*  --  28*  ?CREATININE 1.10  --  1.59*  ?CALCIUM  --   --  8.3*  ? ?GFR: ?Estimated Creatinine Clearance: 65.8 mL/min (A) (by C-G formula based on SCr of 1.59 mg/dL (H)). ?Recent Labs  ?Lab 12/25/21 ?1300  ?WBC 16.2*   ?LATICACIDVEN 1.6  ? ? ?Liver Function Tests: ?Recent Labs  ?Lab 12/25/21 ?1300  ?AST 787*  ?ALT 187*  ?ALKPHOS 66  ?BILITOT 3.7*  ?PROT 7.4  ?ALBUMIN 2.8*  ? ?No results for input(s): LIPASE, AMYLASE in the last 168 hours. ?No results for input(s): AMMONIA in the last 168 hours. ? ?ABG ?   ?Component Value Date/Time  ? PHART 7.373 12/25/2021 1256  ? PCO2ART 43.2 12/25/2021 1256  ? PO2ART 412 (H) 12/25/2021 1256  ? HCO3 25.2 12/25/2021 1256  ? TCO2 26 12/25/2021 1256  ? O2SAT 100 12/25/2021 1256  ?  ? ?Coagulation Profile: ?Recent Labs  ?Lab 12/25/21 ?1300  ?INR 1.1  ? ? ?Cardiac Enzymes: ?Recent Labs  ?Lab 12/25/21 ?1300  ?CKTOTAL 40,770*  ?CKMB 226.7*  ? ? ?HbA1C: ?No results found for: HGBA1C ? ?CBG: ?No results for input(s): GLUCAP in the last 168 hours. ? ?Review of Systems:   ?Unable  to obtain ? ?Past Medical History:  ?He,  has a past medical history of Hypertension.  ? ?Surgical History:  ?History reviewed. No pertinent surgical history.  ? ?Social History:  ? reports that he has never smoked. He has never used smokeless tobacco. He reports current alcohol use. He reports that he does not use drugs.  ? ?Family History:  ?His family history is not on file.  ? ?Allergies ?No Known Allergies  ? ?Home Medications  ?Prior to Admission medications   ?Medication Sig Start Date End Date Taking? Authorizing Provider  ?amLODipine (NORVASC) 5 MG tablet Take 5 mg by mouth daily.    [provider]  ?methocarbamol (ROBAXIN) 500 MG tablet Take 1 tablet (500 mg total) by mouth at bedtime as needed for muscle spasms. 08/13/18   Caccavale, Sophia, PA-C  ?  ? ?Critical care time: 45 minutes ?  ? ?Dorrene German, MS4 ? ?  ?

## 2021-12-25 NOTE — Progress Notes (Signed)
Pharmacy Antibiotic Note ? ?Victor Erickson is a 61 y.o. male admitted on 12/25/2021 with sepsis.  Pharmacy has been consulted for cefepime and vancomycin dosing. ? ?WBC elevated, SCr elevated.  ? ?Plan: ?-Cefepime 2 gm IV Q 8 hours x 7 days  ?-Vancomycin 2 gm IV load followed by Vancomycin 1500 mg IV Q 24 hrs x 7 days. Goal AUC 400-550. ?Expected AUC: 525 ?SCr used: 1.59 ?-Monitor CBC, renal fx, cultures and clinical progress ?-Vancomycin levels as indicated  ? ? ?Height: 6\' 1"  (185.4 cm) ?Weight: 110 kg (242 lb 8.1 oz) ?IBW/kg (Calculated) : 79.9 ? ?No data recorded. ? ?Recent Labs  ?Lab 12/25/21 ?1159 12/25/21 ?1300  ?WBC  --  16.2*  ?CREATININE 1.10 1.59*  ?LATICACIDVEN  --  1.6  ?  ?Estimated Creatinine Clearance: 65.8 mL/min (A) (by C-G formula based on SCr of 1.59 mg/dL (H)).   ? ?No Known Allergies ? ?Antimicrobials this admission: ?Cefepime 4/25 >>  ?Vancomycin 4/25 >>  ?Flagyl 4/25 >> ? ?Dose adjustments this admission: ? ? ?Microbiology results: ?4/25 BCx:  ?4/25 UCx:   ? ? ?Thank you for allowing pharmacy to be a part of this patient?s care. ? ?Albertina Parr, PharmD., BCCCP ?Clinical Pharmacist ?Please refer to [facility] for unit-specific pharmacist  ? ?

## 2021-12-25 NOTE — Progress Notes (Signed)
Started cEEG study.  Notified [facility] monitoring.  Tested patient event button. 

## 2021-12-25 NOTE — Assessment & Plan Note (Signed)
Patient has history of heavy alcohol intake.  Elevated AST ALT. ? ?-Negative discriminant function.  Does not meet criteria for steroids for alcoholic hepatitis. ?

## 2021-12-25 NOTE — Progress Notes (Signed)
EEG complete - results pending 

## 2021-12-25 NOTE — Progress Notes (Signed)
Pt transported on vent from 029 to CT and back without any complications. RN at bedside, RT will continue to monitor.  ?

## 2021-12-25 NOTE — Assessment & Plan Note (Signed)
Superficial injuries on right temple, hip, knee from fall/immobility.  ? ?- Topical wound care ?

## 2021-12-25 NOTE — ED Notes (Signed)
Plan to intubate, pt minimally responsive to painful stimuli  ?

## 2021-12-25 NOTE — ED Notes (Signed)
Respiratory present, Pharmacist present,\ Victor Heath RN present,  ? ?Intubate 26 at the lip,  ?

## 2021-12-25 NOTE — Progress Notes (Signed)
Patient unavailable for EEG.  At [facility], after which they will relocate to 3M03. ? ?

## 2021-12-25 NOTE — Assessment & Plan Note (Addendum)
Intubated for airway protection.  There are infiltrates on chest x-ray.  Some groundglass opacification on CT. ? ?-Daily SBT. ?-Generalized weakness likely major limiter for extubation.  May require tracheostomy. ?-Family are reluctant to proceed with tracheostomy unless strong odds that the patient will recover intact.  Ongoing discussions.  Patient has had long history of alcoholism and has not been functioning well prior to admission.  Family would prefer for one-way extubation rather than proceeding with tracheostomy. ? ?

## 2021-12-25 NOTE — ED Notes (Signed)
Pt has right sided wound around orbital area  ?

## 2021-12-25 NOTE — Assessment & Plan Note (Addendum)
Presented with altered mental status having not been seen for several days.  Difficult to control status epilepticus now resolved for 4 days.Marland Kitchen  MRI shows restricted diffusion consistent with seizures ? ?-Taper Valium, decrease Keppra, and monitor for next 48 hours until next steady-state achieved prior to making further medication changes. ? ?

## 2021-12-25 NOTE — Consult Note (Signed)
Neurology Consultation ?Reason for Consult: Right sided deficits  ?Requesting Physician: Ernie AvenaJames Lawsing ? ?CC: Found down ? ?History is obtained from: EDP and chart review as well as daughter and wife (separated) at bedside ? ?HPI: Victor CanardStanley Erickson is a 61 y.o. male with a past medical history significant for daily alcohol use, hypertension. ? ?Family reports that the patient has been under a significant amount of stress for the last 7 months as he and his wife are separating.  In this setting he has been depressed and not eating, losing 30 to 40 pounds.  They deny any infectious symptoms and reported his only substance use to their knowledge is alcohol.  He has additionally been complaining of chest pain, saying that he is feels like he is going to have a heart attack as well as a knot on his stomach.  He was encouraged to see a doctor but declined to do so.  The last family member who spoke to him was his son who spoke to him Saturday around noon, at which time it did not seem that the patient had been drinking that day.  Subsequently as they were not able to reach him a well check was ordered and he was found down, with injuries that appeared old and covered in feces and urine.  He was briefly bagged by EMS and intubated for unresponsiveness and mental status on arrival to the ED, with noted left gaze deviation, inability to speak or follow commands, and right-sided weakness.  Head CT and CTA were unrevealing.  In this setting neurology was consulted ? ?LKW: 2 days prior to admission ?tPA given?: No, out of the window ?IA performed?: No, no LVO on exam ?Premorbid modified rankin scale: 0-1 ?    0 - No symptoms. ?    1 - No significant disability. Able to carry out all usual activities, despite some symptoms. ? ?ROS: Unable to obtain due to altered mental status, obtained from family as able above ? ?Past Medical History:  ?Diagnosis Date  ? Hypertension   ? ?History reviewed. No pertinent surgical history. ? ? ?No  family history on file. ? ?Social History:  reports that he has never smoked. He has never used smokeless tobacco. He reports current alcohol use. He reports that he does not use drugs. ? ? ?Exam: ?Current vital signs: ?BP 96/74   Pulse (!) 117   Resp (!) 21   Ht 6\' 1"  (1.854 m)   Wt 110 kg   SpO2 96%   BMI 31.99 kg/m?  ?Vital signs in last 24 hours: ?Pulse Rate:  [106-128] 117 (04/25 1430) ?Resp:  [18-29] 21 (04/25 1430) ?BP: (96-136)/(74-94) 96/74 (04/25 1430) ?SpO2:  [94 %-100 %] 96 % (04/25 1430) ?FiO2 (%):  [40 %-100 %] 40 % (04/25 1300) ?Weight:  [110 kg] 110 kg (04/25 1116) ? ? ?Physical Exam  ?Constitutional: Appears ill ?Psych: Minimally interactive ?Eyes: Bilateral severe conjunctival hemorrhages as well as corneal abrasions ?HENT: ET tube in place, partially healed scab in the left temple ?MSK: Scattered bruises and skin tears ?Cardiovascular: Perfusing extremities well ?Respiratory: Appears comfortable on the vent ?GI: Soft.  No distension. There is no obvious tenderness.  ?Skin: Scattered tears ? ?Neuro: ?Mental Status, sensory/motor: ?Patient opens eyes to voice and orients to the left when examiner is standing to the right and calling his name.  He does slightly move his left foot when asked to wiggle his toes, but does not reliably squeeze/open his left hand.  He spontaneously  moves his left hand at least 2-3/5, 1/5 in the left leg, 0/5 on the right even to maximal noxious stimulation ?Cranial Nerves: ?II: Visual Fields are notable for no blink to threat on the right, intact blink to threat on the left. Pupils are round, and reactive to light, somewhat difficult to assess due to significant abrasions but do appear reactive ?III,IV, VI: At best his eyes are midline, he has a strong left gaze preference ?V: Corneals intact to eyelash brush bilaterally ?VII: Facial movement is difficult to assess secondary to ET tube ?VIII: hearing is intact to voice, does orient to examiner on the left ?X:XI:   Weak gag per nursing. No cough on my exam ?Plantars: ?Toes are downgoing on the left and mute on the right ?Cerebellar: ?Unable to assess secondary to patient's mental status  ?Gait:  ?Unable to assess secondary to patient's mental status  ? ?NIHSS total 28 ?Score breakdown: One-point for level of consciousness, 2 points for not answering questions, 2 points for not following commands, one-point for gaze preference towards the left, 2 points for right hemianopia, 2 points for left upper extremity weakness, 4 points for right upper extremity weakness, 3 points for left lower extremity weakness, 4 points for right lower extremity weakness, 2 points for sensory loss on the right side, 3 points for no meaningful communication, unable to assess dysarthria secondary to ET tube, 2 points for neglect of the right ?Performed at 2:45 PM time of patient arrival to ED  ? ? ?I have reviewed labs in epic and the results pertinent to this consultation are: ?AKI with creatinine of 1.59 with baseline 0.9, GFR 50, AST and ALT elevated at 787/187 respectively, CK elevated at 226, troponin elevated at 4127, leukocytosis to 16.2, ethanol level negative, glucose 177 ? ?I have reviewed the images obtained: ?Head CT without clear acute intracranial process though there is a hypodensity in the left basal ganglia which I favor to be calcification ?CTA without a clear LVO ? ? ?Impression: This is an alcoholic patient found down for 2 days, with labs concerning for STEMI, out of the window for STEMI activation, also presenting with full neurologic deficit concerning for stroke versus postictal Todd's paralysis.  ? ?Recommendations: ?-MRI brain stat ?-Followed by stat EEG  ?-Neurology will continue to follow ? ?Brooke Dare MD-PhD ?[facility] ?[phone] ?Available 7 AM to 7 PM, outside these hours please contact Neurologist on call listed on AMION  ? ?Addendum -- EEG w/ subclinical seizure. Will start Keppra, LTM, sedation  with propofol and versed  ? ?- Keppra 2000 mg load, then 1000 mg BID. Adjust as needed for renal function:  ?Estimated Creatinine Clearance: 65.8 mL/min (A) (by C-G formula based on SCr of 1.59 mg/dL (H)).  ? CrCl 80 to 130 mL/minute/1.73 m2: 500 mg to 1.5 g every 12 hours. ? CrCl 50 to <80 mL/minute/1.73 m2: 500 mg to 1 g every 12 hours. ? CrCl 30 to <50 mL/minute/1.73 m2: 250 to 750 mg every 12 hours. ? CrCl 15 to <30 mL/minute/1.73 m2: 250 to 500 mg every 12 hours. ? CrCl <15 mL/minute/1.73 m2: 250 to 500 mg every 24 hours (expert opinion). ? ? ?Total critical care time: 60 minutes ?  ?Critical care time was exclusive of separately billable procedures and treating other patients. ?  ?Critical care was necessary to treat or prevent imminent or life-threatening deterioration. ?  ?Critical care was time spent personally by me on the following activities: development of treatment plan with  patient and/or surrogate as well as nursing, discussions with consultants/primary team, evaluation of patient's response to treatment, examination of patient, obtaining history from patient or surrogate, ordering and performing treatments and interventions, ordering and review of laboratory studies, ordering and review of radiographic studies, and re-evaluation of patient's condition as needed, as documented above.  ?  ? ?\ ?

## 2021-12-25 NOTE — ED Provider Notes (Signed)
?[facility] EMERGENCY DEPARTMENT ?Provider Note ? ? ?CSN: [id number] ?Arrival date & time: 12/25/21  1108 ? ?  ? ?History ? ?Chief Complaint  ?Patient presents with  ? Respiratory Distress  ? ? ?Victor Erickson is a 61 y.o. male. ? ?HPI ? ?61 year old male with medical history significant for hypertension and daily alcohol use who presents to the emergency department with unresponsiveness.  The patient was last contacted 2 days ago.  He resides alone at home.  He is a daily drinker.  He has a history of seizures in the past, family unclear if this is previously been from alcohol withdrawal.  He is supposed to be on AEDs.  He was not seen for the past 2 days and on a welfare check the patient was found down with minimal respirations and unresponsive by EMS.  He was brought to the emergency department actively being bagged and unresponsive.  Per EMS, the patient notably had minimal movement in the right hemibody with a left gaze preference and right-sided neglect.  The patient was placed in a c-collar and subsequently intubated on arrival for poor mental status and airway protection. ? ?Home Medications ?Prior to Admission medications   ?Medication Sig Start Date End Date Taking? Authorizing Provider  ?amLODipine (NORVASC) 5 MG tablet Take 5 mg by mouth daily.   Yes [provider]  ?methocarbamol (ROBAXIN) 500 MG tablet Take 1 tablet (500 mg total) by mouth at bedtime as needed for muscle spasms. ?Patient not taking: Reported on 12/25/2021 08/13/18   Alveria Apley, PA-C  ?   ? ?Allergies    ?Patient has no known allergies.   ? ?Review of Systems   ?Review of Systems  ?Unable to perform ROS: Mental status change  ? ?Physical Exam ?Updated Vital Signs ?BP (!) 145/87   Pulse (!) 118   Temp 98.3 ?F (36.8 ?C) (Oral)   Resp 17   Ht 6\' 1"  (1.854 m)   Wt 110 kg   SpO2 93%   BMI 31.99 kg/m?  ?Physical Exam ?Vitals and nursing note reviewed.  ?Constitutional:   ?   General: He is in acute  distress.  ?   Comments: Unresponsive, GCS 7 (2-1-4)  ?HENT:  ?   Head: Normocephalic.  ?   Comments: Abrasion to the right forehead, appears old and healing ?Eyes:  ?   Conjunctiva/sclera:  ?   Right eye: Hemorrhage present.  ?   Pupils: Pupils are equal, round, and reactive to light.  ?   Comments: Right eye subconjunctival hemorrhage present  ?Cardiovascular:  ?   Rate and Rhythm: Regular rhythm. Tachycardia present.  ?Pulmonary:  ?   Effort: No respiratory distress.  ?   Breath sounds: Rhonchi present.  ?   Comments: Tachypneic, actively being bagged by EMS on arrival ?Abdominal:  ?   General: There is no distension.  ?   Tenderness: There is no guarding.  ?   Comments: Right flank abrasion  ?Musculoskeletal:     ?   General: No deformity or signs of injury.  ?   Cervical back: Neck supple.  ?Skin: ?   Findings: No lesion or rash.  ?Neurological:  ?   Mental Status: He is disoriented.  ?   Comments: Unresponsive, GCS 7 (2 - 1 - 4), no clear cranial nerve deficit, right-sided hemibody neglect with no response to painful stimulus in the right upper extremity and right lower extremity, withdrawal to flexion in the left lower extremity.  Withdraws  to pain in the left upper extremity in response to painful stimuli  ? ? ?ED Results / Procedures / Treatments   ?Labs ?(all labs ordered are listed, but only abnormal results are displayed) ?Labs Reviewed  ?COMPREHENSIVE METABOLIC PANEL - Abnormal; Notable for the following components:  ?    Result Value  ? CO2 18 (*)   ? Glucose, Bld 177 (*)   ? BUN 28 (*)   ? Creatinine, Ser 1.59 (*)   ? Calcium 8.3 (*)   ? Albumin 2.8 (*)   ? AST 787 (*)   ? ALT 187 (*)   ? Total Bilirubin 3.7 (*)   ? GFR, Estimated 50 (*)   ? Anion gap 16 (*)   ? All other components within normal limits  ?CK TOTAL AND CKMB (NOT AT [facility]) - Abnormal; Notable for the following components:  ? Total CK 40,770 (*)   ? CK, MB 226.7 (*)   ? All other components within normal limits  ?CBC WITH  DIFFERENTIAL/PLATELET - Abnormal; Notable for the following components:  ? WBC 16.2 (*)   ? Neutro Abs 13.4 (*)   ? Monocytes Absolute 1.8 (*)   ? All other components within normal limits  ?LACTIC ACID, PLASMA - Abnormal; Notable for the following components:  ? Lactic Acid, Venous 2.6 (*)   ? All other components within normal limits  ?GLUCOSE, CAPILLARY - Abnormal; Notable for the following components:  ? Glucose-Capillary 165 (*)   ? All other components within normal limits  ?I-STAT ARTERIAL BLOOD GAS, ED - Abnormal; Notable for the following components:  ? pO2, Arterial 412 (*)   ? Potassium 3.2 (*)   ? All other components within normal limits  ?I-STAT CHEM 8, ED - Abnormal; Notable for the following components:  ? Chloride 112 (*)   ? BUN 41 (*)   ? Glucose, Bld 191 (*)   ? Calcium, Ion 0.87 (*)   ? TCO2 21 (*)   ? Hemoglobin 17.7 (*)   ? All other components within normal limits  ?TROPONIN I (HIGH SENSITIVITY) - Abnormal; Notable for the following components:  ? Troponin I (High Sensitivity) 4,127 (*)   ? All other components within normal limits  ?TROPONIN I (HIGH SENSITIVITY) - Abnormal; Notable for the following components:  ? Troponin I (High Sensitivity) 6,378 (*)   ? All other components within normal limits  ?TROPONIN I (HIGH SENSITIVITY) - Abnormal; Notable for the following components:  ? Troponin I (High Sensitivity) 4,624 (*)   ? All other components within normal limits  ?RESP PANEL BY RT-PCR (FLU A&B, COVID) ARPGX2  ?CULTURE, BLOOD (ROUTINE X 2)  ?URINE CULTURE  ?MRSA NEXT GEN BY PCR, NASAL  ?ETHANOL  ?LACTIC ACID, PLASMA  ?PROTIME-INR  ?URINALYSIS, ROUTINE W REFLEX MICROSCOPIC  ?RAPID URINE DRUG SCREEN, HOSP PERFORMED  ?CALCIUM, IONIZED  ?HIV ANTIBODY (ROUTINE TESTING W REFLEX)  ?CBC  ?BASIC METABOLIC PANEL  ?BLOOD GAS, ARTERIAL  ?TYPE AND SCREEN  ? ? ?EKG ?EKG Interpretation ? ?Date/Time:  Tuesday December 25 2021 14:23:13 EDT ?Ventricular Rate:  119 ?PR Interval:  109 ?QRS Duration: 99 ?QT  Interval:  403 ?QTC Calculation: 568 ?R Axis:   120 ?Text Interpretation: Sinus tachycardia Abnormal R-wave progression, late transition Inferior infarct, acute (LCx) Lateral leads are also involved Prolonged QT interval >>> Acute MI <<< Discussed with cardiology, Dr. Clifton Alexxus Sobh Confirmed by Ernie Avena [phone]) on 12/25/2021 2:34:18 PM ? ?Radiology ?CT ANGIO HEAD NECK W WO CM ? ?Result  Date: 12/25/2021 ?CLINICAL DATA:  Patient found down trauma EXAM: CT ANGIOGRAPHY HEAD AND NECK TECHNIQUE: Multidetector CT imaging of the head and neck was performed using the standard protocol during bolus administration of intravenous contrast. Multiplanar CT image reconstructions and MIPs were obtained to evaluate the vascular anatomy. Carotid stenosis measurements (when applicable) are obtained utilizing NASCET criteria, using the distal internal carotid diameter as the denominator. RADIATION DOSE REDUCTION: This exam was performed according to the departmental dose-optimization program which includes automated exposure control, adjustment of the mA and/or kV according to patient size and/or use of iterative reconstruction technique. CONTRAST:  100mL OMNIPAQUE IOHEXOL 350 MG/ML SOLN COMPARISON:  None. FINDINGS: Brain: There is no acute intracranial hemorrhage, extra-axial fluid collection, or acute infarct Parenchymal volume is normal. The ventricles are normal in size. There is a small area of encephalomalacia in the left anterior frontal lobe. Gray-white differentiation is otherwise preserved. There are prominent dural calcifications overlying both cerebral hemispheres. There is no suspicious mass lesion. There is no mass effect or midline shift. Vascular: No hyperdense vessel or unexpected calcification. Skull: There is no calvarial fracture. Sinuses/Orbits: There is fluid throughout the sinonasal cavity which may be related to instrumentation. There is a dysconjugate gaze. Other: There is a moderate amount of fluid throughout the  right scalp. CTA NECK FINDINGS Aortic arch: The imaged aortic arch is normal. The origins of the major branch vessels are patent. The subclavian arteries are patent. Right carotid system: The right common, internal, and externa

## 2021-12-25 NOTE — ED Notes (Signed)
100 roc administered  ?

## 2021-12-25 NOTE — ED Triage Notes (Signed)
Pt  BIB EMS found on floor unresponsive. Last known well was 2 days ago. Pt had left sided deviation, pt neglecting right side. Pt put in c-collar  ?

## 2021-12-26 ENCOUNTER — Inpatient Hospital Stay (HOSPITAL_COMMUNITY): Payer: BC Managed Care – PPO

## 2021-12-26 DIAGNOSIS — I469 Cardiac arrest, cause unspecified: Secondary | ICD-10-CM

## 2021-12-26 LAB — HEPATIC FUNCTION PANEL
ALT: 192 U/L — ABNORMAL HIGH (ref 0–44)
AST: 647 U/L — ABNORMAL HIGH (ref 15–41)
Albumin: 2.3 g/dL — ABNORMAL LOW (ref 3.5–5.0)
Alkaline Phosphatase: 53 U/L (ref 38–126)
Bilirubin, Direct: 0.6 mg/dL — ABNORMAL HIGH (ref 0.0–0.2)
Indirect Bilirubin: 1.2 mg/dL — ABNORMAL HIGH (ref 0.3–0.9)
Total Bilirubin: 1.8 mg/dL — ABNORMAL HIGH (ref 0.3–1.2)
Total Protein: 7 g/dL (ref 6.5–8.1)

## 2021-12-26 LAB — CBC
HCT: 53.7 % — ABNORMAL HIGH (ref 39.0–52.0)
Hemoglobin: 17 g/dL (ref 13.0–17.0)
MCH: 30.7 pg (ref 26.0–34.0)
MCHC: 31.7 g/dL (ref 30.0–36.0)
MCV: 97.1 fL (ref 80.0–100.0)
Platelets: 138 10*3/uL — ABNORMAL LOW (ref 150–400)
RBC: 5.53 MIL/uL (ref 4.22–5.81)
RDW: 14.1 % (ref 11.5–15.5)
WBC: 11 10*3/uL — ABNORMAL HIGH (ref 4.0–10.5)
nRBC: 0 % (ref 0.0–0.2)

## 2021-12-26 LAB — GLUCOSE, CAPILLARY
Glucose-Capillary: 184 mg/dL — ABNORMAL HIGH (ref 70–99)
Glucose-Capillary: 159 mg/dL — ABNORMAL HIGH (ref 70–99)
Glucose-Capillary: 120 mg/dL — ABNORMAL HIGH (ref 70–99)
Glucose-Capillary: 138 mg/dL — ABNORMAL HIGH (ref 70–99)
Glucose-Capillary: 136 mg/dL — ABNORMAL HIGH (ref 70–99)
Glucose-Capillary: 161 mg/dL — ABNORMAL HIGH (ref 70–99)

## 2021-12-26 LAB — BASIC METABOLIC PANEL
Anion gap: 15 (ref 5–15)
BUN: 42 mg/dL — ABNORMAL HIGH (ref 6–20)
CO2: 18 mmol/L — ABNORMAL LOW (ref 22–32)
Calcium: 8.2 mg/dL — ABNORMAL LOW (ref 8.9–10.3)
Chloride: 111 mmol/L (ref 98–111)
Creatinine, Ser: 2.26 mg/dL — ABNORMAL HIGH (ref 0.61–1.24)
GFR, Estimated: 33 mL/min — ABNORMAL LOW (ref >60)
Glucose, Bld: 157 mg/dL — ABNORMAL HIGH (ref 70–99)
Potassium: 3.6 mmol/L (ref 3.5–5.1)
Sodium: 144 mmol/L (ref 135–145)

## 2021-12-26 LAB — POCT I-STAT 7, (LYTES, BLD GAS, ICA,H+H)
Acid-base deficit: 2 mmol/L (ref 0.0–2.0)
Bicarbonate: 20.2 mmol/L (ref 20.0–28.0)
Calcium, Ion: 1.14 mmol/L — ABNORMAL LOW (ref 1.15–1.40)
HCT: 45 % (ref 39.0–52.0)
Hemoglobin: 15.3 g/dL (ref 13.0–17.0)
O2 Saturation: 99 %
Patient temperature: 100.6
Potassium: 3.6 mmol/L (ref 3.5–5.1)
Sodium: 142 mmol/L (ref 135–145)
TCO2: 21 mmol/L — ABNORMAL LOW (ref 22–32)
pCO2 arterial: 28.9 mmHg — ABNORMAL LOW (ref 32–48)
pH, Arterial: 7.457 — ABNORMAL HIGH (ref 7.35–7.45)
pO2, Arterial: 128 mmHg — ABNORMAL HIGH (ref 83–108)

## 2021-12-26 LAB — ECHOCARDIOGRAM COMPLETE
Area-P 1/2: 4.08 cm2
Height: 73 in
S' Lateral: 2.6 cm
Weight: 3294.55 oz

## 2021-12-26 LAB — ABO/RH: ABO/RH(D): A POS

## 2021-12-26 LAB — CK: Total CK: 18927 U/L — ABNORMAL HIGH (ref 49–397)

## 2021-12-26 LAB — VITAMIN B12: Vitamin B-12: 1731 pg/mL — ABNORMAL HIGH (ref 180–914)

## 2021-12-26 IMAGING — DX DG CHEST 1V PORT
2 series · 2 of 2 positions shown · non-contrast
Comparison: [DATE]

CLINICAL DATA: Aspiration

EXAM:
PORTABLE CHEST 1 VIEW

[chest ap (1 of 2)]
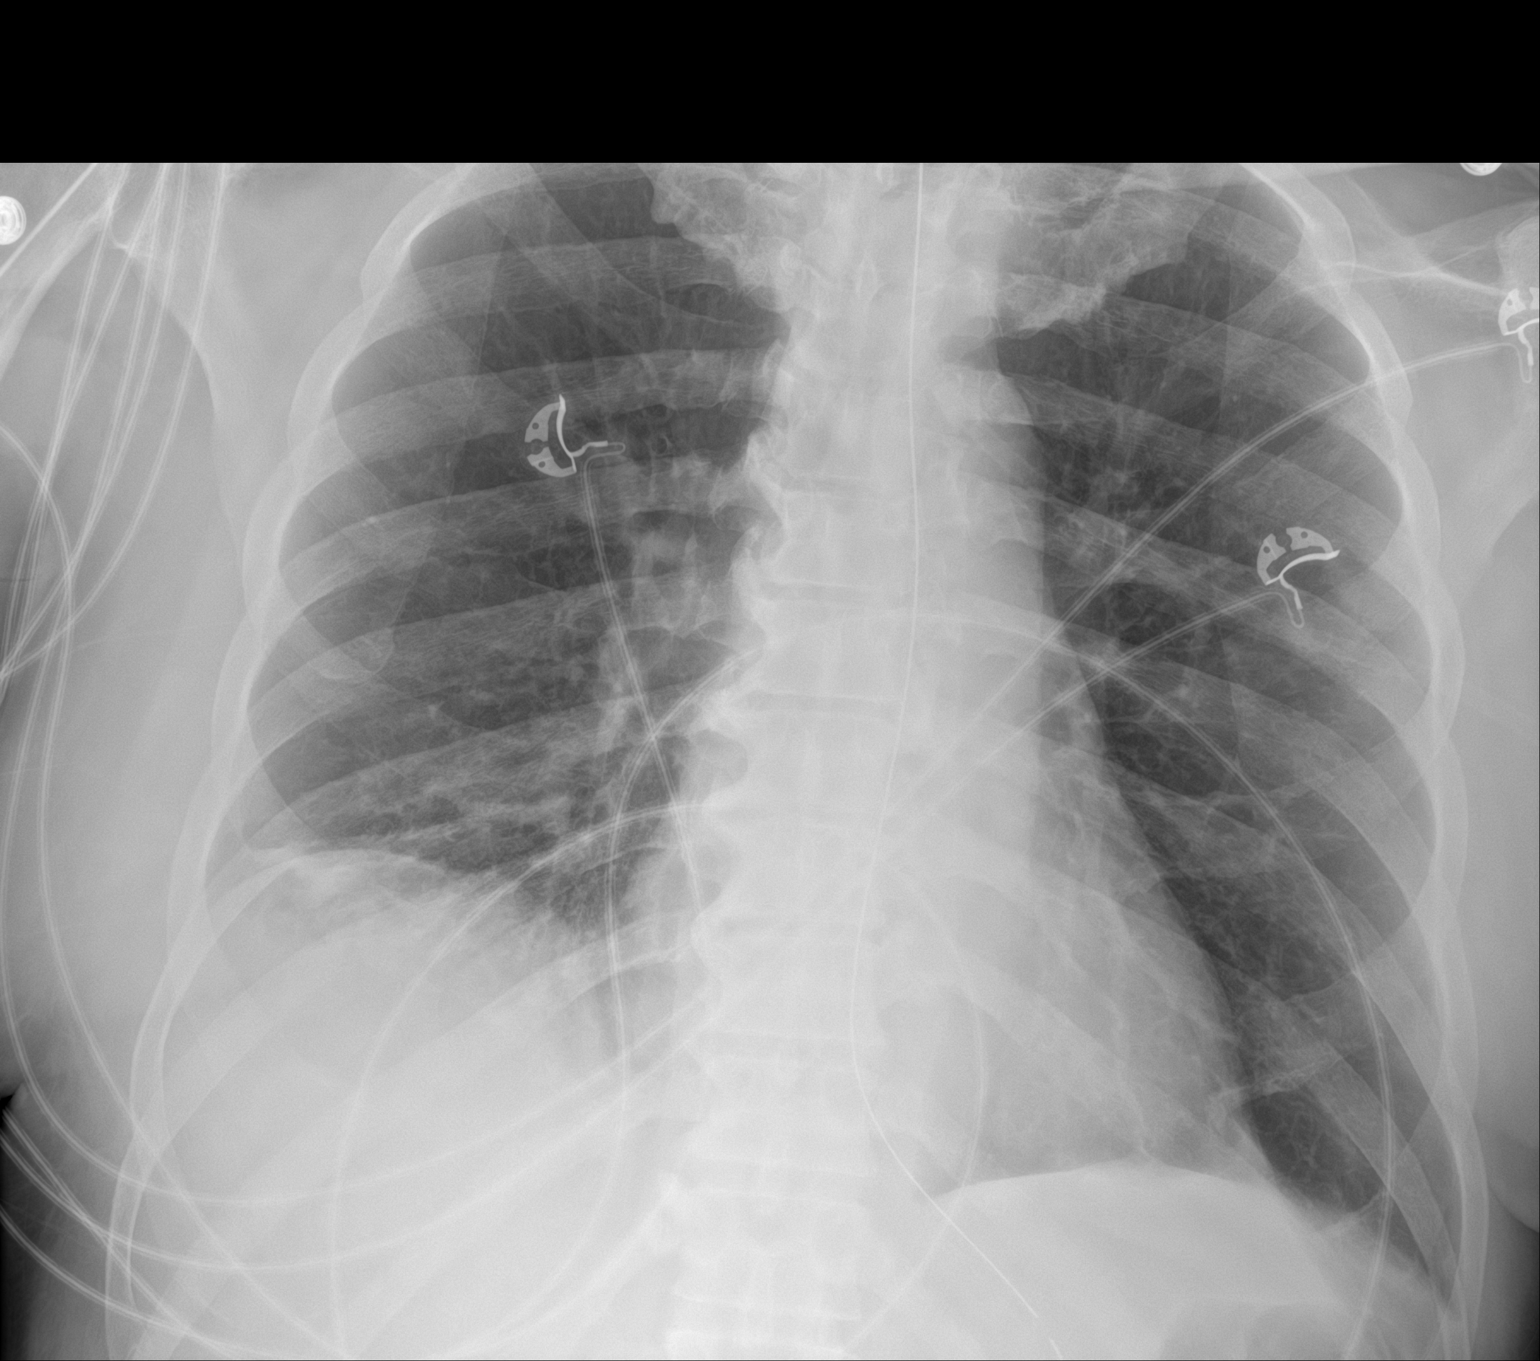

[chest ap (2 of 2)]
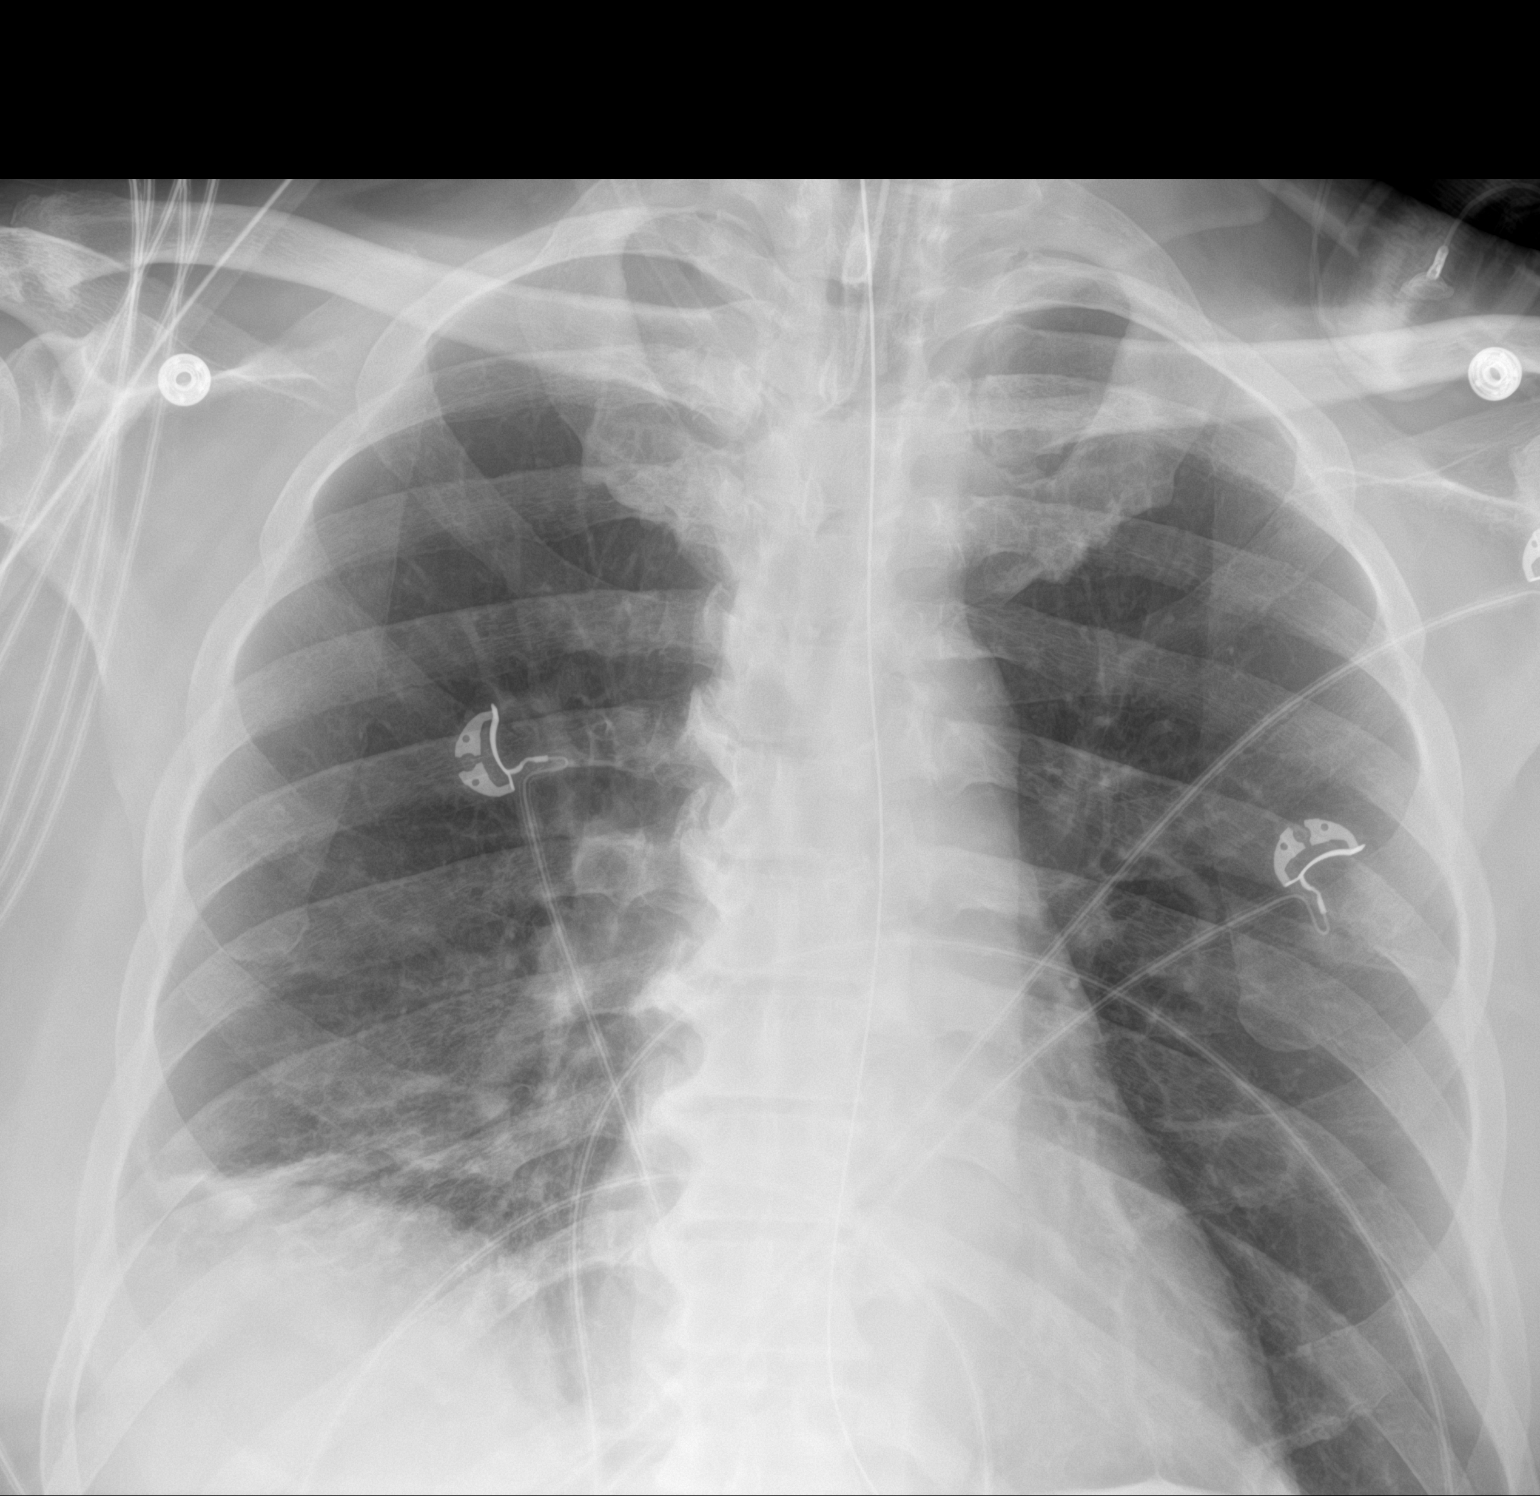

[2 of 2 positions shown; findings below may reference images not displayed]

FINDINGS: Persistent patchy density at the right lung base. Elevation of the
right hemidiaphragm remains present. No pleural effusion or
pneumothorax. Normal heart size. Enteric tube passes into the
stomach.
IMPRESSION: Persistent patchy density at the right lung base. This may reflect
aspiration, noting that elevation of the diaphragm is instead
suggestive of atelectasis.

## 2021-12-26 MED ORDER — MIDAZOLAM BOLUS VIA INFUSION
5.0000 mg | Freq: Once | INTRAVENOUS | Status: AC
  Administered 2021-12-26: 5 mg via INTRAVENOUS
  Filled 2021-12-26: qty 5

## 2021-12-26 MED ORDER — PIPERACILLIN-TAZOBACTAM 3.375 G IVPB
3.3750 g | Freq: Three times a day (TID) | INTRAVENOUS | Status: DC
  Administered 2021-12-26 – 2021-12-28 (×6): 3.375 g via INTRAVENOUS
  Filled 2021-12-26 (×6): qty 50

## 2021-12-26 MED ORDER — PANTOPRAZOLE 2 MG/ML SUSPENSION
40.0000 mg | Freq: Every day | ORAL | Status: DC
  Administered 2021-12-27 – 2022-01-13 (×18): 40 mg
  Filled 2021-12-26 (×18): qty 20

## 2021-12-26 MED ORDER — VITAL HIGH PROTEIN PO LIQD: 1000.0000 mL | ORAL | Status: DC

## 2021-12-26 MED ORDER — FOLIC ACID 1 MG PO TABS
1.0000 mg | ORAL_TABLET | Freq: Every day | ORAL | Status: DC
  Administered 2021-12-26 – 2022-01-13 (×19): 1 mg
  Filled 2021-12-26 (×19): qty 1

## 2021-12-26 MED ORDER — GADOBUTROL 1 MMOL/ML IV SOLN
10.0000 mL | Freq: Once | INTRAVENOUS | Status: AC | PRN
  Administered 2021-12-26: 10 mL via INTRAVENOUS

## 2021-12-26 MED ORDER — POTASSIUM CHLORIDE 10 MEQ/100ML IV SOLN
10.0000 meq | INTRAVENOUS | Status: AC
  Administered 2021-12-26 (×2): 10 meq via INTRAVENOUS
  Filled 2021-12-26 (×2): qty 100

## 2021-12-26 MED ORDER — CALCIUM GLUCONATE-NACL 1-0.675 GM/50ML-% IV SOLN
1.0000 g | Freq: Once | INTRAVENOUS | Status: AC
  Administered 2021-12-26: 1000 mg via INTRAVENOUS
  Filled 2021-12-26: qty 50

## 2021-12-26 MED ORDER — NOREPINEPHRINE 4 MG/250ML-% IV SOLN
INTRAVENOUS | Status: AC
  Administered 2021-12-26: 5 ug/min via INTRAVENOUS
  Filled 2021-12-26: qty 250

## 2021-12-26 MED ORDER — PROSOURCE TF PO LIQD: 45.0000 mL | Freq: Two times a day (BID) | ORAL | Status: DC

## 2021-12-26 MED ORDER — SODIUM CHLORIDE 0.9 % IV SOLN
400.0000 mg | Freq: Once | INTRAVENOUS | Status: AC
  Administered 2021-12-26: 400 mg via INTRAVENOUS
  Filled 2021-12-26: qty 40

## 2021-12-26 MED ORDER — LACTATED RINGERS IV SOLN: INTRAVENOUS | Status: DC

## 2021-12-26 MED ORDER — THIAMINE HCL 100 MG PO TABS
100.0000 mg | ORAL_TABLET | Freq: Every day | ORAL | Status: DC
  Administered 2021-12-26 – 2022-01-13 (×19): 100 mg
  Filled 2021-12-26 (×19): qty 1

## 2021-12-26 MED ORDER — SODIUM CHLORIDE 0.9 % IV SOLN
100.0000 mg | Freq: Two times a day (BID) | INTRAVENOUS | Status: DC
  Administered 2021-12-26 – 2021-12-27 (×2): 100 mg via INTRAVENOUS
  Filled 2021-12-26 (×3): qty 10

## 2021-12-26 MED ORDER — SODIUM CHLORIDE 0.9 % IV SOLN
250.0000 mL | INTRAVENOUS | Status: DC
Start: 2021-12-26 — End: 2022-01-13
  Administered 2022-01-01 – 2022-01-06 (×5): 250 mL via INTRAVENOUS

## 2021-12-26 MED ORDER — NOREPINEPHRINE 4 MG/250ML-% IV SOLN
2.0000 ug/min | INTRAVENOUS | Status: DC
  Administered 2021-12-27: 7 ug/min via INTRAVENOUS
  Administered 2021-12-27: 10 ug/min via INTRAVENOUS
  Administered 2021-12-27: 5 ug/min via INTRAVENOUS
  Filled 2021-12-26 (×3): qty 250

## 2021-12-26 MED ORDER — PROSOURCE TF PO LIQD
45.0000 mL | Freq: Three times a day (TID) | ORAL | Status: DC
  Administered 2021-12-26 – 2022-01-13 (×54): 45 mL
  Filled 2021-12-26 (×51): qty 45

## 2021-12-26 MED ORDER — HEPARIN SODIUM (PORCINE) 5000 UNIT/ML IJ SOLN
5000.0000 [IU] | Freq: Three times a day (TID) | INTRAMUSCULAR | Status: DC
  Administered 2021-12-26 – 2022-01-13 (×54): 5000 [IU] via SUBCUTANEOUS
  Filled 2021-12-26 (×54): qty 1

## 2021-12-26 MED ORDER — ADULT MULTIVITAMIN W/MINERALS CH
1.0000 | ORAL_TABLET | Freq: Every day | ORAL | Status: DC
  Administered 2021-12-26 – 2022-01-13 (×19): 1
  Filled 2021-12-26 (×19): qty 1

## 2021-12-26 MED ORDER — MIDAZOLAM-SODIUM CHLORIDE 100-0.9 MG/100ML-% IV SOLN
INTRAVENOUS | Status: AC
  Administered 2021-12-26: 10 mg/h via INTRAVENOUS
  Filled 2021-12-26: qty 100

## 2021-12-26 MED ORDER — SODIUM CHLORIDE 0.9 % IV SOLN: 2.0000 g | Freq: Two times a day (BID) | INTRAVENOUS | Status: DC

## 2021-12-26 MED ORDER — VANCOMYCIN VARIABLE DOSE PER UNSTABLE RENAL FUNCTION (PHARMACIST DOSING): Status: DC

## 2021-12-26 MED ORDER — SODIUM CHLORIDE 0.9 % IV SOLN
50.0000 mg | Freq: Two times a day (BID) | INTRAVENOUS | Status: DC
  Filled 2021-12-26: qty 5

## 2021-12-26 MED ORDER — VITAL 1.5 CAL PO LIQD
1000.0000 mL | ORAL | Status: DC
  Administered 2021-12-26 – 2022-01-13 (×19): 1000 mL
  Filled 2021-12-26 (×2): qty 1000

## 2021-12-26 MED ORDER — ASPIRIN 81 MG PO CHEW
81.0000 mg | CHEWABLE_TABLET | Freq: Every day | ORAL | Status: DC
  Administered 2021-12-26 – 2022-01-13 (×19): 81 mg
  Filled 2021-12-26 (×20): qty 1

## 2021-12-26 MED ORDER — PERFLUTREN LIPID MICROSPHERE
1.0000 mL | INTRAVENOUS | Status: AC | PRN
  Administered 2021-12-26: 3 mL via INTRAVENOUS
  Filled 2021-12-26: qty 10

## 2021-12-26 MED ORDER — MIDAZOLAM-SODIUM CHLORIDE 100-0.9 MG/100ML-% IV SOLN
10.0000 mg/h | INTRAVENOUS | Status: DC
  Administered 2021-12-27 – 2021-12-29 (×6): 10 mg/h via INTRAVENOUS
  Filled 2021-12-26 (×6): qty 100

## 2021-12-26 NOTE — Progress Notes (Signed)
Per MD order, pt's ETT was advanced 2cm. ETT is now 28 at the lip. ?

## 2021-12-26 NOTE — Progress Notes (Signed)
eLink Physician-Brief Progress Note ?Patient Name: Victor Erickson ?DOB: Oct 14, 1960 ?MRN: [id number] ? ? ?Date of Service ? 12/26/2021  ?HPI/Events of Note ? Patient attempting to self-extubate.  ?eICU Interventions ? Left wrist restraint ordered.  ? ? ? ?  ? ?Thomasene Lot Amaziah Raisanen ?12/26/2021, 11:22 PM ?

## 2021-12-26 NOTE — Procedures (Addendum)
Patient Name: Victor Erickson  ?MRN: [id number]  ?Epilepsy Attending: Charlsie Quest  ?Referring Physician/Provider: Charlsie Quest, MD ?Duration: 12/25/2021 1742 to 12/26/2021 1742 ?  ?Patient history: 62yo M alcoholic patient found down for 2 days, with labs concerning for STEMI, out of the window for STEMI activation, also presenting with full neurologic deficit concerning for stroke versus postictal Todd's paralysis.  ?  ?Level of alertness: comatose ?  ?AEDs during EEG study: LEV, LCM, propofol ?  ?Technical aspects: This EEG study was done with scalp electrodes positioned according to the 10-20 International system of electrode placement. Electrical activity was acquired at a sampling rate of 500Hz  and reviewed with a high frequency filter of 70Hz  and a low frequency filter of 1Hz . EEG data were recorded continuously and digitally stored.  ?  ?Description: EEG showed lateralized periodic discharges at 1-1.5 Hz in left hemisphere which appear rhythmic as well brief 1-2 seconds of generalized EEG attenuation consistent with brief ictal-interictal rhythmic discharges ( BIRDs). EEG also showed continuous generalized and lateralized left hemisphere low amplitude 3 to 6 Hz theta-delta slowing lasting 3-8 seconds admixed with eeg attenuation lasting 1-3 seconds. Hyperventilation and photic stimulation were not performed.    ?  ?ABNORMALITY ?- Brief ictal-interictal rhythmic discharges ( BIRDs), left hemisphere  ?- Continuous slow, generalized and lateralized left hemisphere  ?  ?IMPRESSION: ?This study showed brief ictal-interictal rhythmic discharges ( BIRDs)  in left hemisphere suggestive of high potential for seizure recurrence.  Additionally there is severe to profound diffuse encephalopathy, non specific etiology.  No definite seizures were seen during the study. ?   ?  ?

## 2021-12-26 NOTE — Progress Notes (Signed)
Pt. Has 5 working PIVs, two of those are [place] PIV by IV Team nurse.Rn notified. To apply IV watch on those [place] PIV w/ vasopressor. ?

## 2021-12-26 NOTE — Progress Notes (Signed)
? ?NAME:  Victor Erickson, MRN:  [id number], DOB:  08-01-1961, LOS: 1 ?ADMISSION DATE:  12/25/2021, CONSULTATION DATE:  12/25/21 ?REFERRING MD:  [facility], CHIEF COMPLAINT:  Found down, unresponsive  ? ?History of Present Illness:  ?History provided by patient's daughter. She states that no one had heard from patient for the past 3 days. The family called a welfare check and found the patient down and unresponsive. ? ?Daughter states that patient has a history of alcohol use, as long as she's known him he has been a heavy binge drinker. He is also going through a separation from her mother. States he has recently been saying he thinks he is going to have a heart attack, but she is unsure why he felt that way. Thinks he has lost about 30 pounds since the last time she saw him two months ago.  ? ?Pertinent  Medical History  ?HTN on amlodipine 10 mg daily ?HLD on rosuvastatin 10 mg daily ? ?Significant Hospital Events: ?Including procedures, antibiotic start and stop dates in addition to other pertinent events   ?4/25 admitted ? ?Interim History / Subjective:  ?Subclinical seizures on EEG.  On AEDs, prop.  Low grade fevers. ? ?Objective   ?Blood pressure 112/79, pulse (!) 113, temperature (!) 100.8 ?F (38.2 ?C), temperature source Oral, resp. rate 19, height '6\' 1"'  (1.854 m), weight 93.4 kg, SpO2 95 %. ?   ?Vent Mode: PRVC ?FiO2 (%):  [40 %-100 %] 40 % ?Set Rate:  [18 bmp] 18 bmp ?Vt Set:  [610 mL-630 mL] 630 mL ?PEEP:  [5 cmH20] 5 cmH20 ?Plateau Pressure:  [15 cmH20-18 cmH20] 16 cmH20  ? ?Intake/Output Summary (Last 24 hours) at 12/26/2021 0803 ?Last data filed at 12/26/2021 0700 ?Gross per 24 hour  ?Intake 5553.17 ml  ?Output 795 ml  ?Net 4758.17 ml  ? ?Filed Weights  ? 12/25/21 1116 12/26/21 0200  ?Weight: 110 kg 93.4 kg  ? ? ?Examination: ?Sedated ?R scalp pressure ulcer unstagable ?Lungs with rhonci, passive on vent ?Multiple pressure ulcers ?RASS -5 ?MM dry ?Abdomen soft ? ?Patient Lines/Drains/Airways Status   ? ?  Active Line/Drains/Airways   ? ? Name Placement date Placement time Site Days  ? Peripheral IV 12/25/21 18 G Right Antecubital 12/25/21  --  Antecubital  1  ? Peripheral IV 12/25/21 20 G 1.88" Anterior;Left Forearm 12/25/21  1415  Forearm  1  ? Peripheral IV 12/25/21 20 G 1.88" Anterior;Left;Upper Arm 12/25/21  1435  Arm  1  ? NG/OG Vented/Dual Lumen 16 Fr. Oral 12/25/21  --  Oral  1  ? Urethral Catheter Berkley Harvey, RN Straight-tip 14 Fr. 12/25/21  1536  Straight-tip  1  ? Airway 12/25/21  1118  -- 1  ? Pressure Injury 12/25/21 Back Lateral;Right Deep Tissue Pressure Injury - Purple or maroon localized area of discolored intact skin or blood-filled blister due to damage of underlying soft tissue from pressure and/or shear. Bruising and abrasion from bei 12/25/21  1852  -- 1  ? Pressure Injury 12/25/21 Hip Anterior;Proximal;Right Deep Tissue Pressure Injury - Purple or maroon localized area of discolored intact skin or blood-filled blister due to damage of underlying soft tissue from pressure and/or shear. Large protrusing hema 12/25/21  1600  -- 1  ? Pressure Injury 12/25/21 Shoulder Posterior;Right Deep Tissue Pressure Injury - Purple or maroon localized area of discolored intact skin or blood-filled blister due to damage of underlying soft tissue from pressure and/or shear. eccymosis and abrasion 12/25/21  1600  --  1  ? Pressure Injury 12/25/21 Face Right;Upper Deep Tissue Pressure Injury - Purple or maroon localized area of discolored intact skin or blood-filled blister due to damage of underlying soft tissue from pressure and/or shear. eccymosis and abrasion from bein 12/25/21  1600  -- 1  ? ?  ?  ? ?  ? ?WBC improved ?Plts down a bit ?Trops peaked 6.3k ?Cr worse ? ?Resolved Hospital Problem list   ? ?Assessment & Plan:  ?Alcoholic found down with myriad of issues: ? ?Subclinical status epilepticus- on cEEG and AEDs ?Acute renal failure from dehydration and rhabdomyolysis ?Acute liver injury from rhabdo,  EtOH ?Acute hypoxemic respiratory failure- from aspiration and inability to handle to secretions ?Demand ischemia- trops peaked, not cath candidate for foreseeable future so will not do heparin, echo pending. ?Alcoholism ?Multiple pressure injuries POA ? ?- Zosyn x 5-7 days ?- Aspirin for now, consider statin once ALI improved ?- Recheck CK, more aggressive with hydration ?- AED and cEEG per neuro ?- Vent support and VAP prevention bundle, no SAT until mental status improves ?- Thiamine/folate.  Propofol works fine for etoh w/d ppx ? ?Best Practice (right click and "Reselect all SmartList Selections" daily)  ? ?Diet/type: start TF ?DVT prophylaxis: heparin ppx ?GI prophylaxis: PPI ?Lines: N/A ?Foley:  Yes, and it is still needed ?Code Status:  full code ?Last date of multidisciplinary goals of care discussion [wife/daughter updated at bedside] ? ?38 min cc time ?Erskine Emery MD PCCM ?  ?

## 2021-12-26 NOTE — Progress Notes (Signed)
Patient transported from 3M03 to MRI and back with no complications noted. ?

## 2021-12-26 NOTE — Progress Notes (Signed)
?  Transition of Care (TOC) Screening Note ? ? ?Patient Details  ?Name: Victor Erickson ?Date of Birth: 1961/03/26 ? ? ?Transition of Care (TOC) CM/SW Contact:    ?Tom-Johnson, Hershal Coria, RN ?Phone Number: ?12/26/2021, 4:15 PM ? ?Patient is admitted for Non-Convulsive status- on EEG. Patient has Hx of Alcoholism. Found down at home. Currently on Vent support, Propofol, Fentanyl and IV abx. Has multiple pressure ulcers, WOC following.  ? ?Transition of Care Department [facility]) has reviewed patient and no TOC needs or recommendations have been identified at this time. TOC will continue to monitor patient advancement through interdisciplinary progression rounds. If new patient transition needs arise, please place a TOC consult. ?  ?

## 2021-12-26 NOTE — Procedures (Signed)
Arterial Catheter Insertion Procedure Note ? ?Victor Erickson  ?[id number]  ?[birth year] ? ?Date:12/26/21  ?Time:6:03 PM  ? ? ?Provider Performing: Hart Rochester R  ? ? ?Procedure: Insertion of Arterial Line (55208) without US guidance ? ?Indication(s) ?Blood pressure monitoring and/or need for frequent ABGs ? ?Consent ?Risks of the procedure as well as the alternatives and risks of each were explained to the patient and/or caregiver.  Consent for the procedure was obtained and is signed in the bedside chart ? ?Anesthesia ?None ? ? ?Time Out ?Verified patient identification, verified procedure, site/side was marked, verified correct patient position, special equipment/implants available, medications/allergies/relevant history reviewed, required imaging and test results available. ? ? ?Sterile Technique ?Maximal sterile technique including full sterile barrier drape, hand hygiene, sterile gown, sterile gloves, mask, hair covering, sterile ultrasound probe cover (if used). ? ? ?Procedure Description ?Area of catheter insertion was cleaned with chlorhexidine and draped in sterile fashion. Without real-time ultrasound guidance an arterial catheter was placed into the right radial artery.  Appropriate arterial tracings confirmed on monitor.   ? ? ?Complications/Tolerance ?None; patient tolerated the procedure well. ? ? ?EBL ?Minimal ? ? ?Specimen(s) ?None ? ?

## 2021-12-26 NOTE — Plan of Care (Signed)
Brief Neuro Update: ? ?Versed 5mg  bolus followed by 10mg /hr tonight. Plan is to achieve burst suppression pattern overnight on cEEG and will titrate sedation based on cEEG. Keeping RN in the loop via secure chat. Will reach out to elink to keep them in the loop too. ? ? ?[facility] ?Pager Number ? ?

## 2021-12-26 NOTE — Progress Notes (Signed)
Echocardiogram ?2D Echocardiogram has been performed. ? ?Warren Lacy Sophi Calligan RDCS ?12/26/2021, 9:58 AM ?

## 2021-12-26 NOTE — Progress Notes (Signed)
Subjective: No acute events overnight.  Patient able to move the left side of his body but not moving the right side.  Not opening eyes or following commands.  Patient's brother and son at bedside. ? ?ROS: Unable to obtain due to poor mental status ? ?Examination ? ?Vital signs in last 24 hours: ?Temp:  [98.3 ?F (36.8 ?C)-101.7 ?F (38.7 ?C)] 100.8 ?F (38.2 ?C) (04/26 0730) ?Pulse Rate:  [98-128] 109 (04/26 0828) ?Resp:  [17-29] 17 (04/26 6301) ?BP: (83-163)/(46-98) 107/71 (04/26 6010) ?SpO2:  [87 %-100 %] 98 % (04/26 0828) ?FiO2 (%):  [40 %-100 %] 40 % (04/26 0828) ?Weight:  [93.4 kg-110 kg] 93.4 kg (04/26 0200) ? ?General: lying in bed, NAD ?RS: Intubated ?Neuro: Comatose, does not open eyes to noxious stimuli, does not follow commands, right pupil is miotic and difficult to appreciate any reactivity, left pupil is deformed (Per family he has had an injury to his left eye before), no forced gaze deviation, does have roving eye movements, corneal reflex intact, gag reflex intact, withdraws to noxious stimuli in left upper and lower extremity, no movement in right upper and lower extremity ? ?Basic Metabolic Panel: ?Recent Labs  ?Lab 12/25/21 ?1159 12/25/21 ?1256 12/25/21 ?1300 12/26/21 ?0228 12/26/21 ?0357  ?NA 141 142 141 144 142  ?K 4.9 3.2* 3.9 3.6 3.6  ?CL 112*  --  107 111  --   ?CO2  --   --  18* 18*  --   ?GLUCOSE 191*  --  177* 157*  --   ?BUN 41*  --  28* 42*  --   ?CREATININE 1.10  --  1.59* 2.26*  --   ?CALCIUM  --   --  8.3* 8.2*  --   ? ? ?CBC: ?Recent Labs  ?Lab 12/25/21 ?1159 12/25/21 ?1256 12/25/21 ?1300 12/26/21 ?0228 12/26/21 ?0357  ?WBC  --   --  16.2* 11.0*  --   ?NEUTROABS  --   --  13.4*  --   --   ?HGB 17.7* 16.0 16.1 17.0 15.3  ?HCT 52.0 47.0 50.4 53.7* 45.0  ?MCV  --   --  98.2 97.1  --   ?PLT  --   --  167 138*  --   ? ? ? ?Coagulation Studies: ?Recent Labs  ?  12/25/21 ?1300  ?LABPROT 14.3  ?INR 1.1  ? ? ?Imaging ? ?MRI brain with and without contrast 12/25/2021:1. Restricted diffusion and  edema in the left frontal cortex, left thalamus, medial left temporal lobe, left hippocampus, and right cerebellum, most likely related to seizure activity, without definite seizure etiology identified. These are not felt to represent infarcts given lack conformation to a vascular territory. Redemonstrated fluid collection subjacent to the right parietooccipital scalp. ? ? ? ?ASSESSMENT AND PLAN: 61 year old male who was found down and noted to have seizures arising from the left hemisphere. ? ?Status epilepticus, resolved ?New onset seizures ?Rhabdomyolysis ?Hypoalbuminemia ?Transaminitis ?Hyperbilirubinemia ?AKI ?Leukocytosis ?Thrombocytopenia ?-LTM EEG showed brief ictal-interictal rhythmic discharges in left hemisphere ? ?Recommendations ?-Increase propofol to 50 mcg/h with goal of burst suppression ?-If needed, will add Versed at 10 ml/h next ?-Loaded with Vimpat 400 mg once and start Vimpat 100 mg twice daily ?-Continue Keppra 1000 mg twice daily, renally adjusted ?-If seizures persist, consider adding Depakote ?-Management of rest of comorbidities per primary team ?-Discussed plan in detail with patient's family at bedside ? ?CRITICAL CARE ?Performed by: Charlsie Quest ? ? ?Total critical care time: 40 minutes ? ?Critical care  time was exclusive of separately billable procedures and treating other patients. ? ?Critical care was necessary to treat or prevent imminent or life-threatening deterioration. ? ?Critical care was time spent personally by me on the following activities: development of treatment plan with patient and/or surrogate as well as nursing, discussions with consultants, evaluation of patient's response to treatment, examination of patient, obtaining history from patient or surrogate, ordering and performing treatments and interventions, ordering and review of laboratory studies, ordering and review of radiographic studies, pulse oximetry and re-evaluation of patient's condition. ? ? ?Lindie Spruce ?Epilepsy ?[facility] ?For questions after 5pm please refer to AMION to reach the Neurologist on call  ?

## 2021-12-26 NOTE — Progress Notes (Signed)
LTM EEG checked, O2 repreped and paste added. No skin breakdown under Fp1 and Fp2 ?

## 2021-12-26 NOTE — Progress Notes (Signed)
Initial Nutrition Assessment ? ?DOCUMENTATION CODES:  ? ?Non-severe (moderate) malnutrition in context of social or environmental circumstances ? ?INTERVENTION:  ? ?Initiate tube feeds via OG tube: ?- Start Vital 1.5 @ 20 ml/hr and advance by 10 ml q 8 hours to goal rate of 60 ml/hr (1440 ml/day) ?- ProSource TF 45 ml TID ? ?Tube feeding regimen at goal rate provides 2280 kcal, 130 grams of protein, and 1100 ml of H2O.  ? ?Monitor magnesium, potassium, and phosphorus BID for at least 3 days, MD to replete as needed, as pt is at risk for refeeding syndrome given EtOH abuse, malnutrition. ? ?- Continue MVI with minerals, folic acid, and thiamine per tube ? ?- Checking vitamin A, vitamin B6, vitamin B12, vitamin C, and zinc labs ? ?NUTRITION DIAGNOSIS:  ? ?Moderate Malnutrition related to social / environmental circumstances (relationship stress, EtOH abuse) as evidenced by mild fat depletion, moderate muscle depletion. ? ?GOAL:  ? ?Patient will meet greater than or equal to 90% of their needs ? ?MONITOR:  ? ?Vent status, Labs, Weight trends, TF tolerance, Skin, I & O's ? ?REASON FOR ASSESSMENT:  ? ?Ventilator, Consult ?Enteral/tube feeding initiation and management ? ?ASSESSMENT:  ? ?61 year old male who presented to the ED on 4/25 with respiratory distress and AMS. PMH of HTN, HLD, and EtOH abuse. Pt admitted with NSTEMI, AKI, seizure activity, acute respiratory failure. ? ?Discussed pt with RN and during ICU rounds. ? ?Consult received for enteral nutrition initiation and management. Pt with OG tube in stomach per abdominal x-ray on 4/25. X-ray reading also reports "scattered air in the small bowel and colon could suggest an early/mild ileus." ? ?Reviewed weight history in chart. Pt with a 20 kg weight loss since 08/13/18. This is a 17.6% weight loss in over 3 years which is not clinically significant for timeframe. However, suspect weight loss occurred more acutely per reports from family members of weight loss of  30-40 lbs in last 2-3 months. ? ?Spoke with brother and sister-in-law at bedside. They confirm that pt has lost weight recently but unsure how much and unsure of UBW. They are not sure how pt has been eating lately. Per chart review, pt going through a separation from his significant other and has been a "heavy binge drinker" for as long as daughter has known him. ? ?Patient is currently intubated on ventilator support ?MV: 10.8 L/min ?Temp (24hrs), Avg:100.1 ?F (37.8 ?C), Min:98.3 ?F (36.8 ?C), Max:101.7 ?F (38.7 ?C) ? ?Drips: ?Propofol: 18.9 ml/hr (provides 499 kcal daily from lipid) ?LR: 125 ml/hr ? ?Medications reviewed and include: folic acid, SSI q 4 hours, MVI with minerals, IV protonix, thiamine, IV abx ? ?Vitamin/Mineral Profile: ?Vitamin B12: 1731 (high) ?Vitamin B6: pending ?Vitamin A: pending ?Vitamin C: pending ?Zinc: pending ? ?Labs reviewed: BUN 42, creatinine 2.26, ionized calcium 1.14, elevated LFTs, WBC 11.0, platelets 138 ?CBG's: 120-184 x 24 hours ? ?UOP: 645 ml x 24 hours ?OGT: 150 ml x 12 hours ?I/O's: +4.7 L since admit ? ?NUTRITION - FOCUSED PHYSICAL EXAM: ? ?Flowsheet Row Most Recent Value  ?Orbital Region Mild depletion  ?Upper Arm Region Mild depletion  ?Thoracic and Lumbar Region Moderate depletion  ?Buccal Region Unable to assess  ?Temple Region Moderate depletion  ?Clavicle Bone Region Moderate depletion  ?Clavicle and Acromion Bone Region Moderate depletion  ?Scapular Bone Region Mild depletion  ?Dorsal Hand Mild depletion  ?Patellar Region Moderate depletion  ?Anterior Thigh Region Moderate depletion  ?Posterior Calf Region Moderate depletion  ?Edema (RD  Assessment) None  ?Hair Reviewed  ?Eyes Reviewed  ?Mouth Reviewed  ?Skin Reviewed  ?Nails Reviewed  ? ?  ? ? ?Diet Order:   ?Diet Order   ? ?       ?  Diet NPO time specified  Diet effective now       ?  ? ?  ?  ? ?  ? ? ?EDUCATION NEEDS:  ? ?Not appropriate for education at this time ? ?Skin:  Skin Assessment: ?Skin Integrity  Issues: ?DTI: back, R hip, R shoulder, face ? ?Last BM:  no documented BM ? ?Height:  ? ?Ht Readings from Last 1 Encounters:  ?12/25/21 6\' 1"  (1.854 m)  ? ? ?Weight:  ? ?Wt Readings from Last 1 Encounters:  ?12/26/21 93.4 kg  ? ? ?BMI:  Body mass index is 27.17 kg/m?. ? ?Estimated Nutritional Needs:  ? ?Kcal:  2100-2300 ? ?Protein:  120-140 grams ? ?Fluid:  >2.0 L ? ? ? ?Gustavus Bryant, MS, RD, LDN ?Inpatient Clinical Dietitian ?Please see AMiON for contact information. ? ?

## 2021-12-26 NOTE — Progress Notes (Signed)
Inpatient Diabetes Program Recommendations ? ?AACE/ADA: New Consensus Statement on Inpatient Glycemic Control (2015) ? ?Target Ranges:  Prepandial:   less than 140 mg/dL ?     Peak postprandial:   less than 180 mg/dL (1-2 hours) ?     Critically ill patients:  140 - 180 mg/dL  ? ?Lab Results  ?Component Value Date  ? GLUCAP 120 (H) 12/26/2021  ? ? ?Review of Glycemic Control ? ?Diabetes history: none ? ?Current orders for Inpatient glycemic control: Novolog 2-6 units Q4H ? ?Inpatient Diabetes Program Recommendations:   ? ?Consider obtaining an A1C. ? ?Will continue to follow while inpatient. ? ?Thank you, ?Dulce Sellar, MSN, RN ?Diabetes Coordinator ?Inpatient Diabetes Program ?[phone] (team pager from 8a-5p) ? ? ?

## 2021-12-26 NOTE — Progress Notes (Signed)
eLink Physician-Brief Progress Note ?Patient Name: Victor Erickson ?DOB: 29-Dec-1960 ?MRN: [id number] ? ? ?Date of Service ? 12/26/2021  ?HPI/Events of Note ? Ca++ 1.14, K+ 3.6 PH 7.45, PCO2 28.9  ?eICU Interventions ? Calcium gluconate  1 gm iv  bolus x 1, KCL 10 meq iv Q 1 hour x 2, Respiratory therapist instructed to reduce respiratory rate to 17 from 18.  ? ? ? ?  ? ?Thomasene Lot Mekiyah Gladwell ?12/26/2021, 4:50 AM ?

## 2021-12-27 DIAGNOSIS — G40901 Epilepsy, unspecified, not intractable, with status epilepticus: Principal | ICD-10-CM

## 2021-12-27 DIAGNOSIS — E440 Moderate protein-calorie malnutrition: Secondary | ICD-10-CM | POA: Diagnosis present

## 2021-12-27 DIAGNOSIS — R569 Unspecified convulsions: Secondary | ICD-10-CM

## 2021-12-27 LAB — BASIC METABOLIC PANEL
Anion gap: 12 (ref 5–15)
BUN: 62 mg/dL — ABNORMAL HIGH (ref 6–20)
CO2: 19 mmol/L — ABNORMAL LOW (ref 22–32)
Calcium: 8.1 mg/dL — ABNORMAL LOW (ref 8.9–10.3)
Chloride: 114 mmol/L — ABNORMAL HIGH (ref 98–111)
Creatinine, Ser: 2.57 mg/dL — ABNORMAL HIGH (ref 0.61–1.24)
GFR, Estimated: 28 mL/min — ABNORMAL LOW (ref >60)
Glucose, Bld: 188 mg/dL — ABNORMAL HIGH (ref 70–99)
Potassium: 3.2 mmol/L — ABNORMAL LOW (ref 3.5–5.1)
Sodium: 145 mmol/L (ref 135–145)

## 2021-12-27 LAB — HEPATIC FUNCTION PANEL
ALT: 134 U/L — ABNORMAL HIGH (ref 0–44)
AST: 303 U/L — ABNORMAL HIGH (ref 15–41)
Albumin: 1.8 g/dL — ABNORMAL LOW (ref 3.5–5.0)
Alkaline Phosphatase: 46 U/L (ref 38–126)
Bilirubin, Direct: 0.5 mg/dL — ABNORMAL HIGH (ref 0.0–0.2)
Indirect Bilirubin: 1.1 mg/dL — ABNORMAL HIGH (ref 0.3–0.9)
Total Bilirubin: 1.6 mg/dL — ABNORMAL HIGH (ref 0.3–1.2)
Total Protein: 6.1 g/dL — ABNORMAL LOW (ref 6.5–8.1)

## 2021-12-27 LAB — CBC
HCT: 40.1 % (ref 39.0–52.0)
Hemoglobin: 13.5 g/dL (ref 13.0–17.0)
MCH: 31.4 pg (ref 26.0–34.0)
MCHC: 33.7 g/dL (ref 30.0–36.0)
MCV: 93.3 fL (ref 80.0–100.0)
Platelets: 179 10*3/uL (ref 150–400)
RBC: 4.3 MIL/uL (ref 4.22–5.81)
RDW: 14.1 % (ref 11.5–15.5)
WBC: 14.2 10*3/uL — ABNORMAL HIGH (ref 4.0–10.5)
nRBC: 0.1 % (ref 0.0–0.2)

## 2021-12-27 LAB — GLUCOSE, CAPILLARY
Glucose-Capillary: 147 mg/dL — ABNORMAL HIGH (ref 70–99)
Glucose-Capillary: 168 mg/dL — ABNORMAL HIGH (ref 70–99)
Glucose-Capillary: 170 mg/dL — ABNORMAL HIGH (ref 70–99)
Glucose-Capillary: 179 mg/dL — ABNORMAL HIGH (ref 70–99)
Glucose-Capillary: 181 mg/dL — ABNORMAL HIGH (ref 70–99)
Glucose-Capillary: 189 mg/dL — ABNORMAL HIGH (ref 70–99)

## 2021-12-27 LAB — ZINC: Zinc: 39 ug/dL — ABNORMAL LOW (ref 44–115)

## 2021-12-27 LAB — TRIGLYCERIDES: Triglycerides: 131 mg/dL (ref <150)

## 2021-12-27 LAB — PHOSPHORUS: Phosphorus: 4.5 mg/dL (ref 2.5–4.6)

## 2021-12-27 LAB — CK: Total CK: 8760 U/L — ABNORMAL HIGH (ref 49–397)

## 2021-12-27 LAB — MAGNESIUM: Magnesium: 2.5 mg/dL — ABNORMAL HIGH (ref 1.7–2.4)

## 2021-12-27 MED ORDER — SODIUM CHLORIDE 0.9 % IV SOLN
150.0000 mg | Freq: Two times a day (BID) | INTRAVENOUS | Status: DC
  Administered 2021-12-27 – 2021-12-28 (×2): 150 mg via INTRAVENOUS
  Filled 2021-12-27 (×3): qty 15

## 2021-12-27 MED ORDER — POTASSIUM CHLORIDE 20 MEQ PO PACK
20.0000 meq | PACK | ORAL | Status: AC
  Administered 2021-12-27 (×2): 20 meq
  Filled 2021-12-27 (×2): qty 1

## 2021-12-27 MED ORDER — IPRATROPIUM-ALBUTEROL 0.5-2.5 (3) MG/3ML IN SOLN
3.0000 mL | RESPIRATORY_TRACT | Status: DC | PRN
  Filled 2021-12-27: qty 3

## 2021-12-27 MED ORDER — POTASSIUM CHLORIDE 10 MEQ/100ML IV SOLN
10.0000 meq | INTRAVENOUS | Status: AC
  Administered 2021-12-27 (×2): 10 meq via INTRAVENOUS
  Filled 2021-12-27 (×2): qty 100

## 2021-12-27 NOTE — Progress Notes (Signed)
eLink Physician-Brief Progress Note ?Patient Name: Victor Erickson ?DOB: 08-05-1961 ?MRN: [id number] ? ? ?Date of Service ? 12/27/2021  ?HPI/Events of Note ? K+ 3.2, Creat 2.57, GFR 28.  ?eICU Interventions ? K+ replaced per modified E-Link electrolyte replacement protocol.  ? ? ? ?  ? ?Thomasene Lot Tiwanna Tuch ?12/27/2021, 6:07 AM ?

## 2021-12-27 NOTE — Progress Notes (Signed)
Inpatient Diabetes Program Recommendations ? ?AACE/ADA: New Consensus Statement on Inpatient Glycemic Control (2015) ? ?Target Ranges:  Prepandial:   less than 140 mg/dL ?     Peak postprandial:   less than 180 mg/dL (1-2 hours) ?     Critically ill patients:  140 - 180 mg/dL  ? ?Lab Results  ?Component Value Date  ? GLUCAP 181 (H) 12/27/2021  ? ? ?Review of Glycemic Control ? Latest Reference Range & Units 12/26/21 20:32 12/27/21 00:13 12/27/21 03:30 12/27/21 07:29  ?Glucose-Capillary 70 - 99 mg/dL 829 (H) 937 (H) 169 (H) 181 (H)  ?(H): Data is abnormally high ?  ?Diabetes history: none ?  ?Current orders for Inpatient glycemic control: Novolog 2-6 units Q4H ? ?Inpatient Diabetes Program Recommendations:   ? ?Consider adding Novolog 3 units Q4H for tube feed coverage (to be stopped or held in the event tube feeds are stopped) and A1C as previous result from 04/2021.  ? ?Thanks, ?Lujean Rave, MSN, RNC-OB ?Diabetes Coordinator ?[phone] (8a-5p) ? ? ? ? ?

## 2021-12-27 NOTE — Progress Notes (Addendum)
Subjective: No acute events overnight ? ?ROS: Unable to obtain due to intubation ?Examination ? ?Vital signs in last 24 hours: ?Temp:  [97.9 ?F (36.6 ?C)-101.3 ?F (38.5 ?C)] 97.9 ?F (36.6 ?C) (04/27 1202) ?Pulse Rate:  [60-101] 60 (04/27 1130) ?Resp:  [16-25] 17 (04/27 1130) ?BP: (85-138)/(56-86) 90/86 (04/27 1054) ?SpO2:  [93 %-100 %] 100 % (04/27 1130) ?Arterial Line BP: (72-103)/(54-86) 89/64 (04/27 1130) ?FiO2 (%):  [40 %] 40 % (04/27 1054) ?Weight:  [100.2 kg] 100.2 kg (04/27 0500) ? ?General: lying in bed, NAD ?RS: Intubated ?Neuro: Comatose, does not open eyes to noxious stimuli, does not follow commands, right pupil is miotic and difficult to appreciate any reactivity, left pupil is deformed (Per family he has had an injury to his left eye before), no forced gaze deviation, corneal reflex absent, gag reflex absent, does not withdraw to noxious stimuli in all 4 extremities ? ?Basic Metabolic Panel: ?Recent Labs  ?Lab 12/25/21 ?1159 12/25/21 ?1256 12/25/21 ?1300 12/26/21 ?0228 12/26/21 ?0357 12/27/21 ?0500  ?NA 141 142 141 144 142 145  ?K 4.9 3.2* 3.9 3.6 3.6 3.2*  ?CL 112*  --  107 111  --  114*  ?CO2  --   --  18* 18*  --  19*  ?GLUCOSE 191*  --  177* 157*  --  188*  ?BUN 41*  --  28* 42*  --  62*  ?CREATININE 1.10  --  1.59* 2.26*  --  2.57*  ?CALCIUM  --   --  8.3* 8.2*  --  8.1*  ?MG  --   --   --   --   --  2.5*  ?PHOS  --   --   --   --   --  4.5  ? ? ?CBC: ?Recent Labs  ?Lab 12/25/21 ?1256 12/25/21 ?1300 12/26/21 ?0228 12/26/21 ?0357 12/27/21 ?0500  ?WBC  --  16.2* 11.0*  --  14.2*  ?NEUTROABS  --  13.4*  --   --   --   ?HGB 16.0 16.1 17.0 15.3 13.5  ?HCT 47.0 50.4 53.7* 45.0 40.1  ?MCV  --  98.2 97.1  --  93.3  ?PLT  --  167 138*  --  179  ? ? ? ?Coagulation Studies: ?Recent Labs  ?  12/25/21 ?1300  ?LABPROT 14.3  ?INR 1.1  ? ? ?Imaging ?No new brain imaging overnight ? ? ?ASSESSMENT AND PLAN: 61 year old male who was found down and noted to have seizures arising from the left hemisphere. ?  ?Status  epilepticus, resolved ?New onset seizures ?Rhabdomyolysis ?Hypoalbuminemia ?Transaminitis ?Hyperbilirubinemia ?AKI ?Leukocytosis ?Thrombocytopenia ?-LTM EEG  initially showed brief ictal-interictal rhythmic discharges ( BIRDs)  in left hemisphere suggestive of high potential for seizure recurrence.  Gradually after around 2230 on 12/26/2021 after Versed was added EEG was suggestive of epileptogenicity in left hemisphere as well as profound diffuse encephalopathy, likely due to sedation.  No definite seizures were seen during the study. ?  ?Recommendations ?-Continue propofol to 50 mcg/h and Versed at 10 ml/hr ?-If EEG worsens, can increase Versed further ?-We will likely wean sedation tomorrow ?-Increase Vimpat to 150 mg twice daily ?-Continue Keppra 1000 mg twice daily, renally adjusted ?-Continue LTM EEG while patient is on sedation ?-Management of rest of comorbidities per primary team ?-Discussed plan with critical care team ?-Called and discussed plan with patient's wife ?  ?CRITICAL CARE ?Performed by: Charlsie Quest ?   ?Total critical care time: 32 minutes ?  ?Critical care time  was exclusive of separately billable procedures and treating other patients. ?  ?Critical care was necessary to treat or prevent imminent or life-threatening deterioration. ?  ?Critical care was time spent personally by me on the following activities: development of treatment plan with patient and/or surrogate as well as nursing, discussions with consultants, evaluation of patient's response to treatment, examination of patient, obtaining history from patient or surrogate, ordering and performing treatments and interventions, ordering and review of laboratory studies, ordering and review of radiographic studies, pulse oximetry and re-evaluation of patient's condition. ?  ? ?Lindie Spruce ?Epilepsy ?[facility] ?For questions after 5pm please refer to AMION to reach the Neurologist on call ? ?

## 2021-12-27 NOTE — Progress Notes (Signed)
? ?NAME:  Victor Erickson, MRN:  [id number], DOB:  [birth year], LOS: 2 ?ADMISSION DATE:  12/25/2021, CONSULTATION DATE:  12/25/21 ?REFERRING MD:  [facility], CHIEF COMPLAINT:  Found down, unresponsive  ? ?History of Present Illness:  ?History provided by patient's daughter. She states that no one had heard from patient for the past 3 days. The family called a welfare check and found the patient down and unresponsive. ? ?Daughter states that patient has a history of alcohol use, as long as she's known him he has been a heavy binge drinker. He is also going through a separation from her mother. States he has recently been saying he thinks he is going to have a heart attack, but she is unsure why he felt that way. Thinks he has lost about 30 pounds since the last time she saw him two months ago.  ? ?Pertinent  Medical History  ?HTN on amlodipine 10 mg daily ?HLD on rosuvastatin 10 mg daily ? ?Significant Hospital Events: ?Including procedures, antibiotic start and stop dates in addition to other pertinent events   ?4/25 admitted ? ?Interim History / Subjective:  ?Versed bolus and gtt added overnight for goal burst suppression.  ? ?Objective   ?Blood pressure 126/62, pulse 66, temperature 98.7 ?F (37.1 ?C), temperature source Oral, resp. rate 17, height '6\' 1"'  (1.854 m), weight 100.2 kg, SpO2 99 %. ?   ?Vent Mode: PRVC ?FiO2 (%):  [40 %] 40 % ?Set Rate:  [17 bmp] 17 bmp ?Vt Set:  [097 mL] 630 mL ?PEEP:  [5 cmH20] 5 cmH20 ?Plateau Pressure:  [16 cmH20-21 cmH20] 18 cmH20  ? ?Intake/Output Summary (Last 24 hours) at 12/27/2021 0900 ?Last data filed at 12/27/2021 0800 ?Gross per 24 hour  ?Intake 5635.57 ml  ?Output 1250 ml  ?Net 4385.57 ml  ? ?Filed Weights  ? 12/25/21 1116 12/26/21 0200 12/27/21 0500  ?Weight: 110 kg 93.4 kg 100.2 kg  ? ? ?Examination: ?General:  wdwn male, sedated on vent, NAD  ?HEENT: MM pink/moist ?Neuro: sedated on vent, RASS -3  ?CV: s1s2 rrr, no m/r/g ?PULM:  resps even non labored on vent ?GI: soft,  bsx4 active  ?Extremities: warm/dry, no sig edema  ?Skin: no rashes or lesions ? ?Labs reviewed  ? ?Resolved Hospital Problem list   ? ?Assessment & Plan:  ?Alcoholic found down with myriad of issues: ? ?Subclinical status epilepticus-  ?PLAN -  ?Neuro following  ?on cEEG and AEDs ?Burst suppression - propofol and versed gtts  ?PRN fentanyl  ? ?Acute renal failure from dehydration and rhabdomyolysis. CK trending down  ?PLAN -  ?Trend chem, CK  ?Gentle volume  ? ?Acute liver injury from rhabdo, EtOH ?PLAN -  ?F/u LFTs  ? ?Acute hypoxemic respiratory failure- from aspiration and inability to handle to secretions ?PLAN -  ?Vent support - 8cc/kg  ?F/u CXR  ?F/u ABG ?Zosyn x 5-7 days  ? ?Demand ischemia- trops peaked, not cath candidate for foreseeable future so will not do heparin, echo pending. ?PLAN -  ?ASA ?Consider statin when liver improves  ? ?Alcoholism ?PLAN -  ?Thiamine, folate  ?On propofol  ? ?Multiple pressure injuries POA ?PLAN -  ?Wound care  ? ?Best Practice (right click and "Reselect all SmartList Selections" daily)  ? ?Diet/type: start TF ?DVT prophylaxis: heparin ppx ?GI prophylaxis: PPI ?Lines: N/A ?Foley:  Yes, and it is still needed ?Code Status:  full code ?Last date of multidisciplinary goals of care discussion [wife/daughter updated at bedside] ? ?Cc time  32 mins  ? ?Nickolas Madrid, NP ?Pulmonary/Critical Care Medicine  ?12/27/2021  9:00 AM ? ? ?  ?

## 2021-12-27 NOTE — Procedures (Addendum)
Patient Name: Victor Erickson  ?MRN: [id number]  ?Epilepsy Attending: Lora Havens  ?Referring Physician/Provider: Lora Havens, MD ?Duration: 12/26/2021 1742 to 12/27/2021 1742 ?  ?Patient history: [id number] M alcoholic patient found down for 2 days, with labs concerning for STEMI, out of the window for STEMI activation, also presenting with full neurologic deficit concerning for stroke versus postictal Todd's paralysis.  ?  ?Level of alertness: comatose ?  ?AEDs during EEG study: LEV, LCM, propofol, Versed ?  ?Technical aspects: This EEG study was done with scalp electrodes positioned according to the 10-20 International system of electrode placement. Electrical activity was acquired at a sampling rate of 500Hz  and reviewed with a high frequency filter of 70Hz  and a low frequency filter of 1Hz . EEG data were recorded continuously and digitally stored.  ?  ?Description: At the beginning of the study, EEG showed lateralized periodic discharges at 1-1.5 Hz in left hemisphere as well brief 1-2 seconds of generalized EEG attenuation consistent with brief ictal-interictal rhythmic discharges ( BIRDs). EEG also showed continuous generalized and lateralized left hemisphere low amplitude 3 to 6 Hz theta-delta slowing lasting 3-8 seconds admixed with eeg attenuation lasting 1-3 seconds.  After around 2230 on 12/26/2021, Versed was added.  EEG then showed burst suppression pattern with bursts of 3 to 6 Hz theta -delta slowing admixed with spikes in left hemisphere lasting 1-3 seconds alternating with EEG suppression lasting 3 to 5 seconds.  Hyperventilation and photic stimulation were not performed.    ?  ?ABNORMALITY ?- Brief ictal-interictal rhythmic discharges ( BIRDs), left hemisphere  ?- Continuous slow, generalized and lateralized left hemisphere  ?-Burst suppression, generalized ?- Spikes, left hemisphere ?  ?IMPRESSION: ?This study initially showed brief ictal-interictal rhythmic discharges ( BIRDs)  in left  hemisphere suggestive of high potential for seizure recurrence.  Gradually after around 2230 on 12/26/2021 after Versed was added EEG was suggestive of epileptogenicity in left hemisphere as well as profound diffuse encephalopathy, likely due to sedation.  No definite seizures were seen during the study. ?   ?Lora Havens  ?

## 2021-12-27 NOTE — Progress Notes (Signed)
LTM maint complete - no skin breakdown under: Fp2 F4 F8 serviced A2 ?[facility] monitored, Event button test confirmed by [facility]. ? ?

## 2021-12-28 ENCOUNTER — Inpatient Hospital Stay (HOSPITAL_COMMUNITY): Payer: BC Managed Care – PPO

## 2021-12-28 LAB — COMPREHENSIVE METABOLIC PANEL
ALT: 108 U/L — ABNORMAL HIGH (ref 0–44)
AST: 242 U/L — ABNORMAL HIGH (ref 15–41)
Albumin: 1.7 g/dL — ABNORMAL LOW (ref 3.5–5.0)
Alkaline Phosphatase: 51 U/L (ref 38–126)
Anion gap: 7 (ref 5–15)
BUN: 38 mg/dL — ABNORMAL HIGH (ref 6–20)
CO2: 22 mmol/L (ref 22–32)
Calcium: 8.1 mg/dL — ABNORMAL LOW (ref 8.9–10.3)
Chloride: 115 mmol/L — ABNORMAL HIGH (ref 98–111)
Creatinine, Ser: 1.27 mg/dL — ABNORMAL HIGH (ref 0.61–1.24)
GFR, Estimated: >60 mL/min (ref >60)
Glucose, Bld: 163 mg/dL — ABNORMAL HIGH (ref 70–99)
Potassium: 2.8 mmol/L — ABNORMAL LOW (ref 3.5–5.1)
Sodium: 144 mmol/L (ref 135–145)
Total Bilirubin: 0.8 mg/dL (ref 0.3–1.2)
Total Protein: 5.6 g/dL — ABNORMAL LOW (ref 6.5–8.1)

## 2021-12-28 LAB — GLUCOSE, CAPILLARY
Glucose-Capillary: 145 mg/dL — ABNORMAL HIGH (ref 70–99)
Glucose-Capillary: 145 mg/dL — ABNORMAL HIGH (ref 70–99)
Glucose-Capillary: 146 mg/dL — ABNORMAL HIGH (ref 70–99)
Glucose-Capillary: 159 mg/dL — ABNORMAL HIGH (ref 70–99)
Glucose-Capillary: 168 mg/dL — ABNORMAL HIGH (ref 70–99)
Glucose-Capillary: 173 mg/dL — ABNORMAL HIGH (ref 70–99)
Glucose-Capillary: 176 mg/dL — ABNORMAL HIGH (ref 70–99)

## 2021-12-28 LAB — PHOSPHORUS: Phosphorus: 2.4 mg/dL — ABNORMAL LOW (ref 2.5–4.6)

## 2021-12-28 LAB — CBC
HCT: 37.7 % — ABNORMAL LOW (ref 39.0–52.0)
Hemoglobin: 12.6 g/dL — ABNORMAL LOW (ref 13.0–17.0)
MCH: 31.8 pg (ref 26.0–34.0)
MCHC: 33.4 g/dL (ref 30.0–36.0)
MCV: 95.2 fL (ref 80.0–100.0)
Platelets: 146 10*3/uL — ABNORMAL LOW (ref 150–400)
RBC: 3.96 MIL/uL — ABNORMAL LOW (ref 4.22–5.81)
RDW: 14.3 % (ref 11.5–15.5)
WBC: 13 10*3/uL — ABNORMAL HIGH (ref 4.0–10.5)
nRBC: 0.2 % (ref 0.0–0.2)

## 2021-12-28 LAB — VITAMIN C: Vitamin C: 0.5 mg/dL (ref 0.4–2.0)

## 2021-12-28 LAB — MAGNESIUM: Magnesium: 2.2 mg/dL (ref 1.7–2.4)

## 2021-12-28 LAB — CK: Total CK: 5934 U/L — ABNORMAL HIGH (ref 49–397)

## 2021-12-28 MED ORDER — ZINC SULFATE 220 (50 ZN) MG PO CAPS
220.0000 mg | ORAL_CAPSULE | Freq: Two times a day (BID) | ORAL | Status: AC
  Administered 2021-12-28 – 2022-01-10 (×28): 220 mg
  Filled 2021-12-28 (×28): qty 1

## 2021-12-28 MED ORDER — SODIUM CHLORIDE 0.9 % IV SOLN
200.0000 mg | Freq: Two times a day (BID) | INTRAVENOUS | Status: DC
  Administered 2021-12-28 – 2022-01-08 (×23): 200 mg via INTRAVENOUS
  Filled 2021-12-28 (×27): qty 20

## 2021-12-28 MED ORDER — PHENOBARBITAL SODIUM 130 MG/ML IJ SOLN
130.0000 mg | Freq: Every day | INTRAMUSCULAR | Status: DC
  Administered 2021-12-28 – 2021-12-30 (×3): 130 mg via INTRAVENOUS
  Filled 2021-12-28 (×3): qty 1

## 2021-12-28 MED ORDER — POTASSIUM CHLORIDE 20 MEQ PO PACK
40.0000 meq | PACK | Freq: Two times a day (BID) | ORAL | Status: AC
  Administered 2021-12-28 – 2021-12-29 (×3): 40 meq
  Filled 2021-12-28 (×3): qty 2

## 2021-12-28 MED ORDER — SODIUM CHLORIDE 0.9 % IV SOLN: INTRAVENOUS | Status: DC | PRN

## 2021-12-28 MED ORDER — POTASSIUM CHLORIDE 10 MEQ/100ML IV SOLN
10.0000 meq | INTRAVENOUS | Status: AC
  Administered 2021-12-28 (×4): 10 meq via INTRAVENOUS
  Filled 2021-12-28 (×4): qty 100

## 2021-12-28 MED ORDER — POTASSIUM CHLORIDE 10 MEQ/50ML IV SOLN
10.0000 meq | INTRAVENOUS | Status: DC
  Filled 2021-12-28: qty 50

## 2021-12-28 MED ORDER — BETHANECHOL CHLORIDE 10 MG PO TABS
10.0000 mg | ORAL_TABLET | Freq: Three times a day (TID) | ORAL | Status: DC
  Administered 2021-12-28 – 2021-12-30 (×8): 10 mg
  Filled 2021-12-28 (×9): qty 1

## 2021-12-28 NOTE — Progress Notes (Signed)
Brief Nutrition Note ? ?Discussed pt with RN and during ICU rounds. RD had ordered multiple vitamin and mineral labs to assess for deficiency. ? ?Vitamin/Mineral Profile: ?Vitamin B12: 1731 (high) ?Vitamin B6: pending ?Vitamin A: pending ?Vitamin C: pending ?Zinc: 39 (low) ? ?Zinc lab came back low. Per discussion with MD, okay to order zinc supplementation. Will order zinc sulfate 220 mg BID per tube x 14 days. ? ?RD will continue to follow pt during admission. ? ? ?Gustavus Bryant, MS, RD, LDN ?Inpatient Clinical Dietitian ?Please see AMiON for contact information. ? ?

## 2021-12-28 NOTE — Progress Notes (Deleted)
Pt. arrived to unit with 1 blue sheet, 1 pillow with blue pillow case, 1 blue shirt, and 1 green bag. All items placed in 1 pt belonging bag.  ?

## 2021-12-28 NOTE — Progress Notes (Signed)
Subjective: No acute events overnight.  Patient's son and daughter-in-law at bedside. ? ?ROS: Unable to obtain due to poor mental status ? ?Examination ? ?Vital signs in last 24 hours: ?Temp:  [97.7 ?F (36.5 ?C)-98.6 ?F (37 ?C)] 98.6 ?F (37 ?C) (04/28 1154) ?Pulse Rate:  [57-66] 65 (04/28 1150) ?Resp:  [17-19] 17 (04/28 1200) ?BP: (84-114)/(52-84) 100/69 (04/28 1200) ?SpO2:  [98 %-100 %] 100 % (04/28 1200) ?Arterial Line BP: (83-104)/(54-71) 93/57 (04/27 1915) ?FiO2 (%):  [40 %] 40 % (04/28 1150) ? ?General: lying in bed, NAD ?RS: Intubated ?Neuro: Comatose, does not open eyes to noxious stimuli, does not follow commands, right pupil is miotic and difficult to appreciate any reactivity, left pupil is deformed (Per family he has had an injury to his left eye before), no forced gaze deviation, corneal reflex present, gag reflex present, winces to noxious stimuli in left upper and lower extremity, does not withdraw to noxious stimuli in right upper and lower extremities ? ?Basic Metabolic Panel: ?Recent Labs  ?Lab 12/25/21 ?1159 12/25/21 ?1256 12/25/21 ?1300 12/26/21 ?0228 12/26/21 ?0357 12/27/21 ?0500 12/28/21 ?0641  ?NA 141   < > 141 144 142 145 144  ?K 4.9   < > 3.9 3.6 3.6 3.2* 2.8*  ?CL 112*  --  107 111  --  114* 115*  ?CO2  --   --  18* 18*  --  19* 22  ?GLUCOSE 191*  --  177* 157*  --  188* 163*  ?BUN 41*  --  28* 42*  --  62* 38*  ?CREATININE 1.10  --  1.59* 2.26*  --  2.57* 1.27*  ?CALCIUM  --    < > 8.3* 8.2*  --  8.1* 8.1*  ?MG  --   --   --   --   --  2.5* 2.2  ?PHOS  --   --   --   --   --  4.5 2.4*  ? < > = values in this interval not displayed.  ? ? ?CBC: ?Recent Labs  ?Lab 12/25/21 ?1300 12/26/21 ?0228 12/26/21 ?0357 12/27/21 ?0500 12/28/21 ?0641  ?WBC 16.2* 11.0*  --  14.2* 13.0*  ?NEUTROABS 13.4*  --   --   --   --   ?HGB 16.1 17.0 15.3 13.5 12.6*  ?HCT 50.4 53.7* 45.0 40.1 37.7*  ?MCV 98.2 97.1  --  93.3 95.2  ?PLT 167 138*  --  179 146*  ? ? ? ?Coagulation Studies: ?Recent Labs  ?  12/25/21 ?1300   ?LABPROT 14.3  ?INR 1.1  ? ? ?Imaging ?No new brain imaging overnight ?  ?  ?ASSESSMENT AND PLAN: 61 year old male who was found down and noted to have seizures arising from the left hemisphere. ?  ?Status epilepticus, resolved ?New onset seizures ?-LTM EEG suggestive of epileptogenicity in left hemisphere which is on the ictal-interictal continuum with high potential for seizure recurrence as well as profound diffuse encephalopathy, likely due to sedation.  No definite seizures were seen during the study. ?  ?Recommendations ?-Wean propofol at 10 mcg/hr to stop ?-Continue Versed at 10 ml/hr ?-If EEG worsens, can increase Versed further and load with Depakote ?-Start phenobarbital 130 mg daily ?-Increase Vimpat to 200 mg twice daily ?-Continue Keppra 1000 mg twice daily, renally adjusted ?-Continue LTM EEG while patient is on sedation ?-Management of rest of comorbidities per primary team ?-Discussed plan with critical care team ?-Discussed plan with patient's son and daughter-in-law at bedside ?  ?CRITICAL CARE ?  Performed by: Charlsie Quest ?   ?Total critical care time: 37 minutes ?  ?Critical care time was exclusive of separately billable procedures and treating other patients. ?  ?Critical care was necessary to treat or prevent imminent or life-threatening deterioration. ?  ?Critical care was time spent personally by me on the following activities: development of treatment plan with patient and/or surrogate as well as nursing, discussions with consultants, evaluation of patient's response to treatment, examination of patient, obtaining history from patient or surrogate, ordering and performing treatments and interventions, ordering and review of laboratory studies, ordering and review of radiographic studies, pulse oximetry and re-evaluation of patient's condition. ?  ?Lindie Spruce ?Epilepsy ?[facility] ?For questions after 5pm please refer to AMION to reach the Neurologist on call ? ?

## 2021-12-28 NOTE — Procedures (Addendum)
Patient Name: Victor Erickson  ?MRN: [id number]  ?Epilepsy Attending: Charlsie Quest  ?Referring Physician/Provider: Charlsie Quest, MD ?Duration: 12/27/2021 1742 to 12/28/2021 1742 ?  ?Patient history: 61yo M alcoholic patient found down for 2 days, with labs concerning for STEMI, out of the window for STEMI activation, also presenting with full neurologic deficit concerning for stroke versus postictal Todd's paralysis.  ?  ?Level of alertness: comatose ?  ?AEDs during EEG study: LEV, LCM, propofol, Versed ?  ?Technical aspects: This EEG study was done with scalp electrodes positioned according to the 10-20 International system of electrode placement. Electrical activity was acquired at a sampling rate of 500Hz  and reviewed with a high frequency filter of 70Hz  and a low frequency filter of 1Hz . EEG data were recorded continuously and digitally stored.  ?  ?Description: At the beginning of the study, EEG showed burst suppression pattern with bursts of 3 to 6 Hz theta -delta slowing admixed with spikes in left hemisphere lasting 3-9 seconds alternating with EEG suppression lasting 2-4 seconds.  Gradually, EEG was near continuous with 3 to 6 Hz theta delta slowing admixed with brief 1 to 2 seconds of generalized EEG attenuation admixed with generalized spikes at 1.5 to 2 Hz.  Hyperventilation and photic stimulation were not performed.    ?  ?ABNORMALITY ?- Burst suppression, generalized ?- Spikes, left hemisphere ?  ?IMPRESSION: ?This study was suggestive of epileptogenicity in left hemisphere which is on the ictal-interictal continuum with high potential for seizure recurrence as well as profound diffuse encephalopathy, likely due to sedation.  No definite seizures were seen during the study. ?   ?  ?

## 2021-12-28 NOTE — Progress Notes (Signed)
eLink Physician-Brief Progress Note ?Patient Name: Victor Erickson ?DOB: 01/03/1961 ?MRN: [id number] ? ? ?Date of Service ? 12/28/2021  ?HPI/Events of Note ? Patient needs arterial line replaced.  ?eICU Interventions ? Order entered.  ? ? ? ?  ? ?Thomasene Lot Keyshla Tunison ?12/28/2021, 1:48 AM ?

## 2021-12-28 NOTE — Progress Notes (Signed)
? ?  NAME:  Victor Erickson, MRN:  [id number], DOB:  [birth year], LOS: 3 ?ADMISSION DATE:  12/25/2021, CONSULTATION DATE:  4/25 ?REFERRING MD:  EDP, CHIEF COMPLAINT:  Found down  ? ?History of Present Illness:  ?54 y;o male with a history of alcoholism and seizrues presented after being found down on 4/25. Required intubation for airway protection.  Noted to be in status epilepticus, rhabdomyolysis. Family says that he decided to quit drinking last week. ? ?Pertinent  Medical History  ?Hypertension ?Hyperlipidemia ?Seizures in past ?Alcohol abuse ? ?Significant Hospital Events: ?Including procedures, antibiotic start and stop dates in addition to other pertinent events   ?4/25 admission, CTA head/neck NAICP, small area of encephalomalacial left ant frontal lobe likely from prior trauma, fluid throughout R scalp; CT C.AP> no hematoma, GGO RUL, infiltrates bilateral lower lobes, no RP hematoma, enlarged fatty liver; intubated, LTM started ?4/28 increased vimpat and phenobarb, continue keppra and wean propofol, continue versed infusion; stop zosyn, no seizures on EEG ? ?Interim History / Subjective:  ?No acute events ?Foley catheter put back in ? ?Objective   ?Blood pressure 111/69, pulse 74, temperature 99.8 ?F (37.7 ?C), temperature source Axillary, resp. rate 17, height 6\' 1"  (1.854 m), weight 100.2 kg, SpO2 99 %. ?   ?Vent Mode: PRVC ?FiO2 (%):  [40 %] 40 % ?Set Rate:  [17 bmp] 17 bmp ?Vt Set:  mL] 630 mL ?PEEP:  [5 cmH20] 5 cmH20 ?Plateau Pressure:  [15 cmH20-18 cmH20] 17 cmH20  ? ?Intake/Output Summary (Last 24 hours) at 12/28/2021 1526 ?Last data filed at 12/28/2021 1500 ?Gross per 24 hour  ?Intake 5913.73 ml  ?Output 2525 ml  ?Net 3388.73 ml  ? ?Filed Weights  ? 12/25/21 1116 12/26/21 0200 12/27/21 0500  ?Weight: 110 kg 93.4 kg 100.2 kg  ? ? ?Examination: ?General:  In bed on vent ?HENT: NCAT ETT in place ?PULM: CTA B, vent supported breathing ?CV: RRR, no mgr ?GI: BS+, soft, nontender ?MSK: normal bulk and  tone ?Neuro: sedated on vent ? ? ?Resolved Hospital Problem list   ? ? ?Assessment & Plan:  ?Status epilepticus ?Continue keppra, vimpat, phenobarb ?Continue LTM EEG ?Wean propofol today ?Continue versed ?F/u Neuro recs ? ?Alcohol abuse ?Thiamine, folate ?Phenobarb ? ?Acute respiratory failure with hypoxemia ?Full mechanical vent support ?VAP prevention ?Daily WUA/SBT ?No weaning today ? ?Aspiration pneumonia > resolved ?Stop antibiotics today ? ?Rhabdomylosis> improved ?Monitor BMET and UOP ?Replace electrolytes as needed ? ?Need for sedation for mechanical ventilation ?No role for PAD protocol during burst suppression ?RASS goal/PAD to be set tomorrow ? ?EtOH hepatitis ?Monitor LFT PRN ? ?Pressure injures POA ?Wound care ? ?Urinary retention ?Bethanechol ?foley ? ?Best Practice (right click and "Reselect all SmartList Selections" daily)  ? ?Diet/type: tubefeeds ?DVT prophylaxis: prophylactic heparin  ?GI prophylaxis: N/A and PPI ?Lines: N/A ?Foley:  Yes, and it is still needed ?Code Status:  full code ?Last date of multidisciplinary goals of care discussion [sons updated on 4/28 bedside] ? ?Critical care time: 35 minutes ?  ? ? ? ?5/28, MD ?[facility] ?Pager: [phone] ?Cell: [phone] ?After 7:00 pm call Elink  [phone] ? ? ?

## 2021-12-28 NOTE — Progress Notes (Signed)
eLink Physician-Brief Progress Note ?Patient Name: Victor Erickson ?DOB: 1961-03-23 ?MRN: [id number] ? ? ?Date of Service ? 12/28/2021  ?HPI/Events of Note ? Patient with recurrent urinary retention.  ?eICU Interventions ? Foley catheter protocol ordered.  ? ? ? ?  ? ?[name] ?12/28/2021, 4:37 AM ?

## 2021-12-29 ENCOUNTER — Inpatient Hospital Stay (HOSPITAL_COMMUNITY): Payer: BC Managed Care – PPO

## 2021-12-29 LAB — PHOSPHORUS: Phosphorus: 1.6 mg/dL — ABNORMAL LOW (ref 2.5–4.6)

## 2021-12-29 LAB — POCT I-STAT 7, (LYTES, BLD GAS, ICA,H+H)
Acid-Base Excess: 0 mmol/L (ref 0.0–2.0)
Bicarbonate: 23.5 mmol/L (ref 20.0–28.0)
Calcium, Ion: 1.21 mmol/L (ref 1.15–1.40)
HCT: 31 % — ABNORMAL LOW (ref 39.0–52.0)
Hemoglobin: 10.5 g/dL — ABNORMAL LOW (ref 13.0–17.0)
O2 Saturation: 99 %
Patient temperature: 101.9
Potassium: 4.1 mmol/L (ref 3.5–5.1)
Sodium: 149 mmol/L — ABNORMAL HIGH (ref 135–145)
TCO2: 25 mmol/L (ref 22–32)
pCO2 arterial: 36.5 mmHg (ref 32–48)
pH, Arterial: 7.424 (ref 7.35–7.45)
pO2, Arterial: 159 mmHg — ABNORMAL HIGH (ref 83–108)

## 2021-12-29 LAB — CBC
HCT: 36.4 % — ABNORMAL LOW (ref 39.0–52.0)
Hemoglobin: 12 g/dL — ABNORMAL LOW (ref 13.0–17.0)
MCH: 31.9 pg (ref 26.0–34.0)
MCHC: 33 g/dL (ref 30.0–36.0)
MCV: 96.8 fL (ref 80.0–100.0)
Platelets: 141 10*3/uL — ABNORMAL LOW (ref 150–400)
RBC: 3.76 MIL/uL — ABNORMAL LOW (ref 4.22–5.81)
RDW: 14.6 % (ref 11.5–15.5)
WBC: 12.6 10*3/uL — ABNORMAL HIGH (ref 4.0–10.5)
nRBC: 0.8 % — ABNORMAL HIGH (ref 0.0–0.2)

## 2021-12-29 LAB — BASIC METABOLIC PANEL
Anion gap: 4 — ABNORMAL LOW (ref 5–15)
BUN: 24 mg/dL — ABNORMAL HIGH (ref 6–20)
CO2: 22 mmol/L (ref 22–32)
Calcium: 8 mg/dL — ABNORMAL LOW (ref 8.9–10.3)
Chloride: 122 mmol/L — ABNORMAL HIGH (ref 98–111)
Creatinine, Ser: 1 mg/dL (ref 0.61–1.24)
GFR, Estimated: >60 mL/min (ref >60)
Glucose, Bld: 190 mg/dL — ABNORMAL HIGH (ref 70–99)
Potassium: 3.9 mmol/L (ref 3.5–5.1)
Sodium: 148 mmol/L — ABNORMAL HIGH (ref 135–145)

## 2021-12-29 LAB — HEPATIC FUNCTION PANEL
ALT: 92 U/L — ABNORMAL HIGH (ref 0–44)
AST: 193 U/L — ABNORMAL HIGH (ref 15–41)
Albumin: 1.7 g/dL — ABNORMAL LOW (ref 3.5–5.0)
Alkaline Phosphatase: 59 U/L (ref 38–126)
Bilirubin, Direct: 0.2 mg/dL (ref 0.0–0.2)
Indirect Bilirubin: 0.4 mg/dL (ref 0.3–0.9)
Total Bilirubin: 0.6 mg/dL (ref 0.3–1.2)
Total Protein: 5.5 g/dL — ABNORMAL LOW (ref 6.5–8.1)

## 2021-12-29 LAB — GLUCOSE, CAPILLARY
Glucose-Capillary: 213 mg/dL — ABNORMAL HIGH (ref 70–99)
Glucose-Capillary: 155 mg/dL — ABNORMAL HIGH (ref 70–99)
Glucose-Capillary: 135 mg/dL — ABNORMAL HIGH (ref 70–99)
Glucose-Capillary: 174 mg/dL — ABNORMAL HIGH (ref 70–99)
Glucose-Capillary: 140 mg/dL — ABNORMAL HIGH (ref 70–99)
Glucose-Capillary: 180 mg/dL — ABNORMAL HIGH (ref 70–99)

## 2021-12-29 LAB — MAGNESIUM: Magnesium: 1.9 mg/dL (ref 1.7–2.4)

## 2021-12-29 LAB — CK: Total CK: 4641 U/L — ABNORMAL HIGH (ref 49–397)

## 2021-12-29 IMAGING — DX DG CHEST 1V PORT
1 series · 1 of 1 positions shown · non-contrast
Comparison: Portable chest [DATE] and earlier.

CLINICAL DATA: 60-year-old male with shortness of breath,
intubated. Neurologic deficit.

EXAM:
PORTABLE CHEST 1 VIEW

[chest ap]
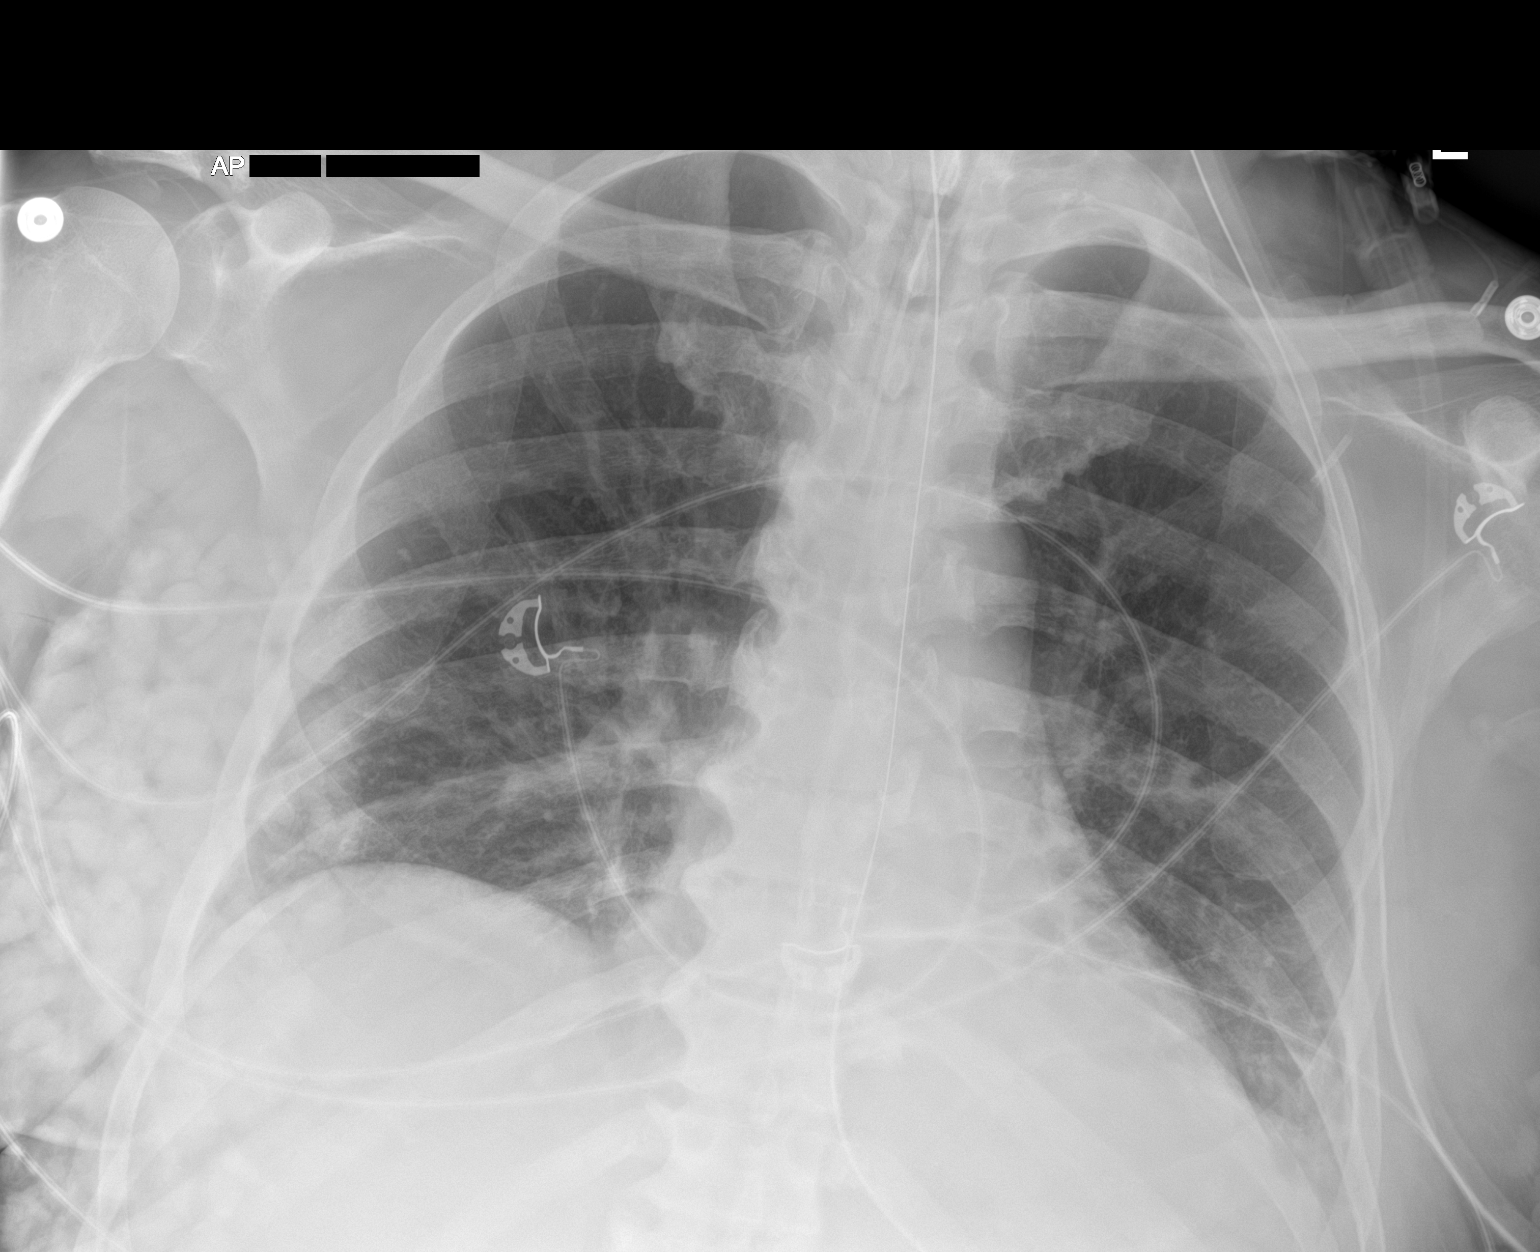

[1 of 1 positions shown; findings below may reference images not displayed]

FINDINGS: Portable AP semi upright view at [RT] hours. Stable lines and tubes.
Mildly lower lung volumes. Mild new retrocardiac opacity partially
obscuring the medial left hemidiaphragm. But otherwise Allowing for
portable technique the lungs are clear. No pneumothorax or pleural
effusion identified. No acute osseous abnormality identified.
IMPRESSION: 1. Stable lines and tubes.
2. Further increased medial left lung base opacity. Favor
atelectasis/lower lobe collapse.
3. No other acute cardiopulmonary abnormality.

## 2021-12-29 MED ORDER — IPRATROPIUM-ALBUTEROL 0.5-2.5 (3) MG/3ML IN SOLN
3.0000 mL | Freq: Three times a day (TID) | RESPIRATORY_TRACT | Status: DC
  Administered 2021-12-29 – 2022-01-13 (×44): 3 mL via RESPIRATORY_TRACT
  Filled 2021-12-29 (×42): qty 3

## 2021-12-29 MED ORDER — FREE WATER
200.0000 mL | Status: DC
  Administered 2021-12-29 – 2022-01-09 (×65): 200 mL

## 2021-12-29 MED ORDER — K PHOS MONO-SOD PHOS DI & MONO 155-852-130 MG PO TABS
500.0000 mg | ORAL_TABLET | Freq: Three times a day (TID) | ORAL | Status: AC
  Administered 2021-12-29 (×3): 500 mg
  Filled 2021-12-29 (×3): qty 2

## 2021-12-29 MED ORDER — AMOXICILLIN-POT CLAVULANATE 875-125 MG PO TABS
1.0000 | ORAL_TABLET | Freq: Two times a day (BID) | ORAL | Status: DC
  Administered 2021-12-29 – 2021-12-30 (×3): 1
  Filled 2021-12-29 (×4): qty 1

## 2021-12-29 NOTE — Progress Notes (Signed)
Maint and skin check complete. No break down. 

## 2021-12-29 NOTE — Progress Notes (Signed)
Ice packs placed for temp of 101.3 ?

## 2021-12-29 NOTE — Progress Notes (Signed)
Pt placed back on full vent support due to increased RR & WOB. Family at bedside.  ?

## 2021-12-29 NOTE — Progress Notes (Signed)
[facility] ? ?Discussed with neurology ?Fever overnight, can lower seizure threshold ?Will add back augmentin, cooling blanket ? ?Roselie Awkward, MD ?[facility] ?Pager: [phone] ?Cell: [phone] ?After 7:00 pm call Elink  [phone] ? ?

## 2021-12-29 NOTE — Progress Notes (Addendum)
Subjective:  ?Febrile overnight ?Slightly more reactive as versed is being weaned  ? ?ROS: Unable to obtain due to poor mental status ? ?Examination ? ?Current vital signs: ?BP 105/60 (BP Location: Left Arm)   Pulse 78   Temp (!) 100.5 ?F (38.1 ?C) (Axillary)   Resp 17   Ht 6\' 1"  (1.854 m)   Wt 100.2 kg   SpO2 100%   BMI 29.14 kg/m?  ?Vital signs in last 24 hours: ?Temp:  [98.6 ?F (37 ?C)-101.9 ?F (38.8 ?C)] 100.5 ?F (38.1 ?C) (04/29 0744) ?Pulse Rate:  [61-89] 78 (04/29 08-26-1990) ?Resp:  [17-26] 17 (04/29 08-26-1990) ?BP: (91-123)/(52-84) 105/60 (04/29 0800) ?SpO2:  [90 %-100 %] 100 % (04/29 08-26-1990) ?FiO2 (%):  [40 %-100 %] 40 % (04/29 08-26-1990) ? ?General: lying in bed, NAD ?RS: Intubated ?Neuro: Opens eyes to noxious stimuli, does not follow commands, right pupil is 3 mm to 2 mm, left pupil is deformed (chronic), no forced gaze deviation, corneal reflex present, winces to noxious stimuli in left upper and lower extremity with trace movement 1/5, moves right leg more briskly than the left leg to noxious stim, slightly moves RUE as well 1/5 today similar to the left  ? ?Basic Metabolic Panel: ?Recent Labs  ?Lab 12/25/21 ?1300 12/26/21 ?0228 12/26/21 ?0357 12/27/21 ?0500 12/28/21 ?12/30/21 12/29/21 ?0151 12/29/21 ?0426  ?NA 141 144 142 145 144 148* 149*  ?K 3.9 3.6 3.6 3.2* 2.8* 3.9 4.1  ?CL 107 111  --  114* 115* 122*  --   ?CO2 18* 18*  --  19* 22 22  --   ?GLUCOSE 177* 157*  --  188* 163* 190*  --   ?BUN 28* 42*  --  62* 38* 24*  --   ?CREATININE 1.59* 2.26*  --  2.57* 1.27* 1.00  --   ?CALCIUM 8.3* 8.2*  --  8.1* 8.1* 8.0*  --   ?MG  --   --   --  2.5* 2.2 1.9  --   ?PHOS  --   --   --  4.5 2.4* 1.6*  --   ? ? ? ?CBC: ?Recent Labs  ?Lab 12/25/21 ?1300 12/26/21 ?0228 12/26/21 ?0357 12/27/21 ?0500 12/28/21 ?12/30/21 12/29/21 ?0151 12/29/21 ?0426  ?WBC 16.2* 11.0*  --  14.2* 13.0* 12.6*  --   ?NEUTROABS 13.4*  --   --   --   --   --   --   ?HGB 16.1 17.0 15.3 13.5 12.6* 12.0* 10.5*  ?HCT 50.4 53.7* 45.0 40.1 37.7* 36.4* 31.0*  ?MCV  98.2 97.1  --  93.3 95.2 96.8  --   ?PLT 167 138*  --  179 146* 141*  --   ? ? ? ? ?Coagulation Studies: ?No results for input(s): LABPROT, INR in the last 72 hours. ? ? ?Imaging ?No new brain imaging overnight ?  ?  ?ASSESSMENT AND PLAN: 61 year old male who was found down and noted to have seizures arising from the left hemisphere. ?  ?Status epilepticus, resolved ?New onset seizures ?-LTM EEG continues to be suggestive of epileptogenicity in left hemisphere which is on the ictal-interictal continuum with high potential for seizure recurrence as well as profound diffuse encephalopathy, likely due to sedation.  No definite seizures were seen during the study. ?  ?Recommendations ?-Wean Versed at 10 ml/hr ?-If EEG worsens, can resume Versed, increase Keppra to 1500 mg BID and/or load with Depakote  ?-Continue phenobarbital 130 mg daily ?-Continue Vimpat to 200 mg twice daily ?-Continue Keppra  1000 mg twice daily, can be increased to 1500 mg BID if further seizures given improving renal function ?-Continue LTM EEG while patient is on sedation ?-Management of rest of comorbidities per primary team, including further infectious workup per CCM given fever overnight ?-Discussed plan with critical care team  ? ? ?CRITICAL CARE ?Performed by: Brooke Dare, MD PhD ?   ?Total critical care time: 35 minutes ?  ?Critical care time was exclusive of separately billable procedures and treating other patients. ?  ?Critical care was necessary to treat or prevent imminent or life-threatening deterioration. ?  ?Critical care was time spent personally by me on the following activities: development of treatment plan with patient and/or surrogate as well as nursing, discussions with consultants, evaluation of patient's response to treatment, examination of patient, obtaining history from patient or surrogate, ordering and performing treatments and interventions, ordering and review of laboratory studies, ordering and review of  radiographic studies, pulse oximetry and re-evaluation of patient's condition. ?  ?Brooke Dare MD-PhD ?[facility] ?[phone]  ?Available 7 AM to 7 PM, outside these hours please contact Neurologist on call listed on AMION  ? ?

## 2021-12-29 NOTE — Progress Notes (Signed)
There was no charge for the 4/28. Double checked through charge capture. Only EEG charge was from [facility]. Patient will have two charges billed from the 04/29 due to not yesterday charge.  ?

## 2021-12-29 NOTE — Procedures (Addendum)
Patient Name: Victor Erickson  ?MRN: [id number]  ?Epilepsy Attending: Lora Havens  ?Referring Physician/Provider: Lora Havens, MD ?Duration: 12/28/2021 1742 to 12/29/2021 1742 ?  ?Patient history: [id number] M alcoholic patient found down for 2 days, with labs concerning for STEMI, out of the window for STEMI activation, also presenting with full neurologic deficit concerning for stroke versus postictal Todd's paralysis.  ?  ?Level of alertness: comatose ?  ?AEDs during EEG study: LEV, LCM, Versed ?  ?Technical aspects: This EEG study was done with scalp electrodes positioned according to the 10-20 International system of electrode placement. Electrical activity was acquired at a sampling rate of 500Hz  and reviewed with a high frequency filter of 70Hz  and a low frequency filter of 1Hz . EEG data were recorded continuously and digitally stored.  ?  ?Description: EEG showed lateralized periodic discharges in left hemisphere, maximal left frontal region, maximal F3 at 2hz  . Additionally, there is near continuous  generalized low amplitude 2-3hz  delta slowing admixed with 15-16hz  beta activity as well as 12- seconds of generalized eeg attenuation.  Hyperventilation and photic stimulation were not performed.    ?  ?ABNORMALITY ?- Lateralized periodic discharges, left hemisphere, maximal left frontal region, maximal F3 ?- Continuous slow, generalized  ?  ?IMPRESSION: ?This study showed evidence of epileptogenicity in left hemisphere, maximal left frontal region which is on the ictal-interictal continuum with high potential for seizure recurrence as well as profound diffuse encephalopathy, likely due to sedation.  No definite seizures were seen during the study. ?   ?Lora Havens  ?

## 2021-12-29 NOTE — Progress Notes (Signed)
? ?  NAME:  Victor Erickson, MRN:  [id number], DOB:  02-Dec-1960, LOS: 4 ?ADMISSION DATE:  12/25/2021, CONSULTATION DATE:  4/25 ?REFERRING MD:  EDP, CHIEF COMPLAINT:  Found down  ? ?History of Present Illness:  ?86 y;o male with a history of alcoholism and seizrues presented after being found down on 4/25. Required intubation for airway protection.  Noted to be in status epilepticus, rhabdomyolysis. Family says that he decided to quit drinking last week. ? ?Pertinent  Medical History  ?Hypertension ?Hyperlipidemia ?Seizures in past ?Alcohol abuse ? ?Significant Hospital Events: ?Including procedures, antibiotic start and stop dates in addition to other pertinent events   ?4/25 admission, CTA head/neck NAICP, small area of encephalomalacial left ant frontal lobe likely from prior trauma, fluid throughout R scalp; CT C.AP> no hematoma, GGO RUL, infiltrates bilateral lower lobes, no RP hematoma, enlarged fatty liver; intubated, LTM started ?4/28 increased vimpat and phenobarb, continue keppra and wean propofol, continue versed infusion; stop zosyn, no seizures on EEG ? ?Interim History / Subjective:  ? ?No acute events ? ?Objective   ?Blood pressure (!) 101/56, pulse 82, temperature 99.7 ?F (37.6 ?C), temperature source Axillary, resp. rate 18, height 6\' 1"  (1.854 m), weight 100.2 kg, SpO2 100 %. ?   ?Vent Mode: PRVC ?FiO2 (%):  [40 %-100 %] 60 % ?Set Rate:  [17 bmp-18 bmp] 18 bmp ?Vt Set:  mL] 630 mL ?PEEP:  [5 cmH20] 5 cmH20 ?Plateau Pressure:  [16 cmH20-22 cmH20] 22 cmH20  ? ?Intake/Output Summary (Last 24 hours) at 12/29/2021 0704 ?Last data filed at 12/29/2021 0600 ?Gross per 24 hour  ?Intake 3521.3 ml  ?Output 2725 ml  ?Net 796.3 ml  ? ?Filed Weights  ? 12/25/21 1116 12/26/21 0200 12/27/21 0500  ?Weight: 110 kg 93.4 kg 100.2 kg  ? ? ?Examination: ? ?General:  In bed on vent ?HENT: NCAT ETT in place ?PULM: CTA B, vent supported breathing ?CV: RRR, no mgr ?GI: BS+, soft, nontender ?MSK: normal bulk and tone ?Neuro:  sedated on vent ? ? ? ?Resolved Hospital Problem list   ? ? ?Assessment & Plan:  ?Status epilepticus requiring burst suppression ?Continue keppra, vimpat, phenobarb ?Continue LTM EEG ?Stop versed today ?F/u neuro recs ? ?Alcohol abuse ?Thiamine, folate ?phenobarb ? ?Acute respiratory failure with hypoxemia ?Full mechanical vent support ?VAP prevention ?Daily WUA/SBT ?OK to wean vent today ? ?Aspiration pneumonia > resolved ?Monitor off antibiotics ? 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 ?Need for sedation for mechanical ventilation ?Stop burst suppression today then attempt extubation ? ?EtOH hepatitis ?LFT PRN ? ?Pressure injures POA ?Wound care ? ?Urinary retention ?Bethanecol ?foley ? ?Best Practice (right click and "Reselect all SmartList Selections" daily)  ? ?Diet/type: tubefeeds ?DVT prophylaxis: prophylactic heparin  ?GI prophylaxis: N/A and PPI ?Lines: N/A ?Foley:  Yes, and it is still needed ?Code Status:  full code ?Last date of multidisciplinary goals of care discussion [wife and daughter updated bedside 4/29] ? ?Critical care time: 32 minutes ?  ? ? ? ?12/29/21, MD ?[facility] ?Pager: [phone] ?Cell: [phone] ?After 7:00 pm call Elink  [phone] ? ? ?

## 2021-12-30 LAB — BASIC METABOLIC PANEL
Anion gap: 5 (ref 5–15)
BUN: 18 mg/dL (ref 6–20)
CO2: 23 mmol/L (ref 22–32)
Calcium: 8 mg/dL — ABNORMAL LOW (ref 8.9–10.3)
Chloride: 120 mmol/L — ABNORMAL HIGH (ref 98–111)
Creatinine, Ser: 0.79 mg/dL (ref 0.61–1.24)
GFR, Estimated: >60 mL/min (ref >60)
Glucose, Bld: 183 mg/dL — ABNORMAL HIGH (ref 70–99)
Potassium: 3.9 mmol/L (ref 3.5–5.1)
Sodium: 148 mmol/L — ABNORMAL HIGH (ref 135–145)

## 2021-12-30 LAB — URINALYSIS, ROUTINE W REFLEX MICROSCOPIC
Bilirubin Urine: NEGATIVE
Glucose, UA: NEGATIVE mg/dL
Ketones, ur: NEGATIVE mg/dL
Leukocytes,Ua: NEGATIVE
Nitrite: NEGATIVE
Protein, ur: 30 mg/dL — AB
Specific Gravity, Urine: 1.021 (ref 1.005–1.030)
pH: 5 (ref 5.0–8.0)

## 2021-12-30 LAB — GLUCOSE, CAPILLARY
Glucose-Capillary: 118 mg/dL — ABNORMAL HIGH (ref 70–99)
Glucose-Capillary: 123 mg/dL — ABNORMAL HIGH (ref 70–99)
Glucose-Capillary: 147 mg/dL — ABNORMAL HIGH (ref 70–99)
Glucose-Capillary: 153 mg/dL — ABNORMAL HIGH (ref 70–99)
Glucose-Capillary: 176 mg/dL — ABNORMAL HIGH (ref 70–99)
Glucose-Capillary: 185 mg/dL — ABNORMAL HIGH (ref 70–99)

## 2021-12-30 LAB — RAPID URINE DRUG SCREEN, HOSP PERFORMED
Amphetamines: NOT DETECTED
Barbiturates: POSITIVE — AB
Benzodiazepines: POSITIVE — AB
Cocaine: NOT DETECTED
Opiates: NOT DETECTED
Tetrahydrocannabinol: NOT DETECTED

## 2021-12-30 LAB — VITAMIN A: Vitamin A (Retinoic Acid): 30.5 ug/dL (ref 20.1–62.0)

## 2021-12-30 LAB — CBC
HCT: 34.3 % — ABNORMAL LOW (ref 39.0–52.0)
Hemoglobin: 10.5 g/dL — ABNORMAL LOW (ref 13.0–17.0)
MCH: 30.6 pg (ref 26.0–34.0)
MCHC: 30.6 g/dL (ref 30.0–36.0)
MCV: 100 fL (ref 80.0–100.0)
Platelets: 180 10*3/uL (ref 150–400)
RBC: 3.43 MIL/uL — ABNORMAL LOW (ref 4.22–5.81)
RDW: 14.9 % (ref 11.5–15.5)
WBC: 15.2 10*3/uL — ABNORMAL HIGH (ref 4.0–10.5)
nRBC: 0.5 % — ABNORMAL HIGH (ref 0.0–0.2)

## 2021-12-30 LAB — CULTURE, BLOOD (ROUTINE X 2)
Culture: NO GROWTH
Special Requests: ADEQUATE

## 2021-12-30 LAB — PHOSPHORUS: Phosphorus: 2.9 mg/dL (ref 2.5–4.6)

## 2021-12-30 LAB — MAGNESIUM: Magnesium: 1.8 mg/dL (ref 1.7–2.4)

## 2021-12-30 LAB — VITAMIN B6

## 2021-12-30 LAB — CK: Total CK: 2629 U/L — ABNORMAL HIGH (ref 49–397)

## 2021-12-30 LAB — PHENOBARBITAL LEVEL: Phenobarbital: <5 ug/mL — ABNORMAL LOW (ref 15.0–40.0)

## 2021-12-30 LAB — TRIGLYCERIDES: Triglycerides: 75 mg/dL (ref <150)

## 2021-12-30 MED ORDER — POLYETHYLENE GLYCOL 3350 17 G PO PACK
17.0000 g | PACK | Freq: Every day | ORAL | Status: DC
  Administered 2022-01-09: 17 g
  Filled 2021-12-30: qty 1

## 2021-12-30 MED ORDER — LEVETIRACETAM IN NACL 500 MG/100ML IV SOLN
500.0000 mg | Freq: Once | INTRAVENOUS | Status: AC
  Administered 2021-12-30: 500 mg via INTRAVENOUS
  Filled 2021-12-30: qty 100

## 2021-12-30 MED ORDER — DOCUSATE SODIUM 50 MG/5ML PO LIQD
100.0000 mg | Freq: Two times a day (BID) | ORAL | Status: DC
  Administered 2021-12-30 – 2022-01-09 (×8): 100 mg
  Filled 2021-12-30 (×9): qty 10

## 2021-12-30 MED ORDER — LEVETIRACETAM IN NACL 1500 MG/100ML IV SOLN
1500.0000 mg | Freq: Two times a day (BID) | INTRAVENOUS | Status: DC
  Administered 2021-12-30 – 2021-12-31 (×2): 1500 mg via INTRAVENOUS
  Filled 2021-12-30 (×3): qty 100

## 2021-12-30 MED ORDER — PHENOBARBITAL SODIUM 65 MG/ML IJ SOLN
65.0000 mg | Freq: Every morning | INTRAMUSCULAR | Status: DC
  Administered 2021-12-30 – 2022-01-01 (×3): 65 mg via INTRAVENOUS
  Filled 2021-12-30 (×3): qty 1

## 2021-12-30 MED ORDER — FENTANYL CITRATE (PF) 100 MCG/2ML IJ SOLN: 50.0000 ug | INTRAMUSCULAR | Status: DC | PRN

## 2021-12-30 MED ORDER — PHENOBARBITAL SODIUM 130 MG/ML IJ SOLN
130.0000 mg | Freq: Every day | INTRAMUSCULAR | Status: DC
  Administered 2021-12-31: 130 mg via INTRAVENOUS
  Filled 2021-12-30: qty 1

## 2021-12-30 MED ORDER — FENTANYL CITRATE (PF) 100 MCG/2ML IJ SOLN
50.0000 ug | INTRAMUSCULAR | Status: DC | PRN
  Administered 2021-12-30 – 2022-01-06 (×12): 100 ug via INTRAVENOUS
  Filled 2021-12-30 (×12): qty 2

## 2021-12-30 MED ORDER — BETHANECHOL 1 MG/ML PEDIATRIC ORAL SUSPENSION
10.0000 mg | Freq: Once | ORAL | Status: DC
  Filled 2021-12-30: qty 10

## 2021-12-30 MED ORDER — BETHANECHOL CHLORIDE 10 MG PO TABS
10.0000 mg | ORAL_TABLET | Freq: Three times a day (TID) | ORAL | Status: AC
  Administered 2021-12-30: 10 mg
  Filled 2021-12-30: qty 1

## 2021-12-30 MED ORDER — AMOXICILLIN-POT CLAVULANATE 400-57 MG/5ML PO SUSR
800.0000 mg | Freq: Once | ORAL | Status: AC
  Administered 2021-12-30: 800 mg
  Filled 2021-12-30: qty 10

## 2021-12-30 NOTE — Progress Notes (Signed)
EEG maint complete. No skin breakdown °

## 2021-12-30 NOTE — Progress Notes (Signed)
? ?  NAME:  Victor Erickson, MRN:  [id number], DOB:  05-Jun-1961, LOS: 5 ?ADMISSION DATE:  12/25/2021, CONSULTATION DATE:  4/25 ?REFERRING MD:  EDP, CHIEF COMPLAINT:  Found down  ? ?History of Present Illness:  ?33 y;o male with a history of alcoholism and seizrues presented after being found down on 4/25. Required intubation for airway protection.  Noted to be in status epilepticus, rhabdomyolysis. Family says that he decided to quit drinking last week. ? ?Pertinent  Medical History  ?Hypertension ?Hyperlipidemia ?Seizures in past ?Alcohol abuse ? ?Significant Hospital Events: ?Including procedures, antibiotic start and stop dates in addition to other pertinent events   ?4/25 admission, CTA head/neck NAICP, small area of encephalomalacial left ant frontal lobe likely from prior trauma, fluid throughout R scalp; CT C.AP> no hematoma, GGO RUL, infiltrates bilateral lower lobes, no RP hematoma, enlarged fatty liver; intubated, LTM started ?4/28 increased vimpat and phenobarb, continue keppra and wean propofol, continue versed infusion; stop zosyn, no seizures on EEG ?4/30 waking ? ?Interim History / Subjective:  ? ?Will wake up now, move around ?Fever yesterday, WBC up ? ?Objective   ?Blood pressure (!) 97/54, pulse 80, temperature 99.3 ?F (37.4 ?C), temperature source Oral, resp. rate 17, height 6\' 1"  (1.854 m), weight 103.6 kg, SpO2 99 %. ?   ?Vent Mode: PRVC ?FiO2 (%):  [40 %] 40 % ?Set Rate:  [17 bmp-18 bmp] 17 bmp ?Vt Set:  mL] 630 mL ?PEEP:  [5 cmH20] 5 cmH20 ?Pressure Support:  [10 cmH20] 10 cmH20 ?Plateau Pressure:  [17 cmH20-19 cmH20] 17 cmH20  ? ?Intake/Output Summary (Last 24 hours) at 12/30/2021 0749 ?Last data filed at 12/29/2021 2300 ?Gross per 24 hour  ?Intake 1664.7 ml  ?Output 1395 ml  ?Net 269.7 ml  ? ?Filed Weights  ? 12/26/21 0200 12/27/21 0500 12/30/21 0500  ?Weight: 93.4 kg 100.2 kg 103.6 kg  ? ? ?Examination: ? ?General:  In bed on vent ?HENT: NCAT hard collar ETT in place ?PULM: CTA B, vent  supported breathing ?CV: RRR, no mgr ?GI: BS+, soft, nontender ?MSK: normal bulk and tone ?Neuro: sedated on vent ? ? ? ? ?Resolved Hospital Problem list   ? ? ?Assessment & Plan:  ?Status epilepticus requiring burst suppression > subclinical seizure 4/30 ?Keppra, vimpat, phenobarb per neuro ?Continue LTM EEG ?F/u neuro recs ? ?Found down, wearing hard collar per protocol ?Clear clinically when more awake ? ?Alcohol abuse ?Thiamine, folate, phenobarb ? ?Acute respiratory failure with hypoxemia> passing SBT on 4/30 but not awake enough for extubation ?Full mechanical vent support ?VAP prevention ?Daily WUA/SBT ?Extubate when more awake ? ?Aspiration pneumonia > resolved ?Monitor off antibiotics ? ?Rhabdomylosis> improved ?Hypernatremia ?Monitor BMET and UOP ?Replace electrolytes as needed ? ?Need for sedation for mechanical ventilation ?Add PAD protocol, prn fentanyl only, RASS 0 ? ?EtOH hepatitis ?LFT prn ? ?Pressure injures POA ?Wound care ? ?Urinary retention ?Remove foley today ?bethanecol ? ?Best Practice (right click and "Reselect all SmartList Selections" daily)  ? ?Diet/type: tubefeeds ?DVT prophylaxis: prophylactic heparin  ?GI prophylaxis: N/A and PPI ?Lines: N/A ?Foley:  Yes, and it is still needed ?Code Status:  full code ?Last date of multidisciplinary goals of care discussion [son and brother updated bedside on 4/30] ? ?Critical care time: 35 minutes ?  ? ? ? ?5/30, MD ?[facility] ?Pager: [phone] ?Cell: [phone] ?After 7:00 pm call Elink  [phone] ? ? ?

## 2021-12-30 NOTE — Progress Notes (Addendum)
Neurology Progress Note  ? ?Subjective:  ?- Augmentin restarted for fever, leukocytosis increasing to 15 ?- Off versed since 11 AM on 4/29 ? ?ROS: Unable to obtain due to poor mental status ? ?Examination ? ?Current vital signs: ?BP (!) 102/54 (BP Location: Left Arm)   Pulse 84   Temp 98.9 ?F (37.2 ?C) (Oral)   Resp 20   Ht 6\' 1"  (1.854 m)   Wt 103.6 kg   SpO2 99%   BMI 30.13 kg/m?  ?Vital signs in last 24 hours: ?Temp:  [98.8 ?F (37.1 ?C)-101.8 ?F (38.8 ?C)] 98.9 ?F (37.2 ?C) (04/30 01-15-1999) ?Pulse Rate:  [72-91] 84 (04/30 0815) ?Resp:  [16-27] 20 (04/30 0815) ?BP: (78-112)/(53-64) 102/54 (04/30 0800) ?SpO2:  [95 %-100 %] 99 % (04/30 0815) ?FiO2 (%):  [40 %] 40 % (04/30 0800) ?Weight:  [103.6 kg] 103.6 kg (04/30 0500) ? ?General: lying in bed, NAD ?RS: Intubated ?Neuro: Eyes open spontaneously, does not follow commands, right pupil is 3 mm to 2 mm, left pupil is deformed (chronic), no forced gaze deviation, eyes conjugate with square wave jerks, corneal reflex present, does not reliably orient to voice winces to noxious stimuli in left upper and lower extremity with withdrawal 2/5 in the bilateral UE, 1/5 in the bilateral LE ? ?Basic Metabolic Panel: ?Recent Labs  ?Lab 12/26/21 ?0228 12/26/21 ?0357 12/27/21 ?0500 12/28/21 ?0641 12/29/21 ?0151 12/29/21 ?0426 12/30/21 ?01/01/22  ?NA 144   < > 145 144 148* 149* 148*  ?K 3.6   < > 3.2* 2.8* 3.9 4.1 3.9  ?CL 111  --  114* 115* 122*  --  120*  ?CO2 18*  --  19* 22 22  --  23  ?GLUCOSE 157*  --  188* 163* 190*  --  183*  ?BUN 42*  --  62* 38* 24*  --  18  ?CREATININE 2.26*  --  2.57* 1.27* 1.00  --  0.79  ?CALCIUM 8.2*  --  8.1* 8.1* 8.0*  --  8.0*  ?MG  --   --  2.5* 2.2 1.9  --  1.8  ?PHOS  --   --  4.5 2.4* 1.6*  --  2.9  ? < > = values in this interval not displayed.  ? ? ?Lab Results  ?Component Value Date  ? ALT 92 (H) 12/29/2021  ? AST 193 (H) 12/29/2021  ? ALKPHOS 59 12/29/2021  ? BILITOT 0.6 12/29/2021  ?  ? ?CBC: ?Recent Labs  ?Lab 12/25/21 ?1300 12/26/21 ?0228  12/26/21 ?0357 12/27/21 ?0500 12/28/21 ?0641 12/29/21 ?0151 12/29/21 ?0426 12/30/21 ?01/01/22  ?WBC 16.2* 11.0*  --  14.2* 13.0* 12.6*  --  15.2*  ?NEUTROABS 13.4*  --   --   --   --   --   --   --   ?HGB 16.1 17.0   < > 13.5 12.6* 12.0* 10.5* 10.5*  ?HCT 50.4 53.7*   < > 40.1 37.7* 36.4* 31.0* 34.3*  ?MCV 98.2 97.1  --  93.3 95.2 96.8  --  100.0  ?PLT 167 138*  --  179 146* 141*  --  180  ? < > = values in this interval not displayed.  ? ? ? ?Imaging ?No new brain imaging overnight ?  ?  ?ASSESSMENT AND PLAN: 61 year old alcoholic male who was found down x2 days and noted to have seizures arising from the left hemisphere, now off sedation, EEG stable on brief bedside review, full read pending ?  ?Status epilepticus, resolved ?New onset  seizures ?-LTM EEG continues to be suggestive of epileptogenicity in left hemisphere which is on the ictal-interictal continuum with high potential for seizure recurrence as well as profound diffuse encephalopathy, likely due to sedation.  No definite seizures were seen during the study. ?  ?Recommendations ?-If no seizures on EEG, will discontinue EEG monitoring ?-If EEG worsening will increase Keppra to 1500 mg BID first, load with Depakote second (LFTs have been improving) ?-Continue phenobarbital 130 mg daily ?-Continue Vimpat to 200 mg twice daily ?-Continue Keppra 1000 mg twice daily, can be increased to 1500 mg BID if further seizures given improving renal function ?-Management of rest of comorbidities per primary team ?-Discussed plan with critical care team  ? ? ?CRITICAL CARE ?Performed by: Brooke Dare, MD PhD ?   ?Total critical care time: 35 minutes ?  ?Critical care time was exclusive of separately billable procedures and treating other patients. ?  ?Critical care was necessary to treat or prevent imminent or life-threatening deterioration. ?  ?Critical care was time spent personally by me on the following activities: development of treatment plan with patient and/or  surrogate as well as nursing, discussions with consultants, evaluation of patient's response to treatment, examination of patient, obtaining history from patient or surrogate, ordering and performing treatments and interventions, ordering and review of laboratory studies, ordering and review of radiographic studies, pulse oximetry and re-evaluation of patient's condition. ?  ?Brooke Dare MD-PhD ?[facility] ?[phone]  ?Available 7 AM to 7 PM, outside these hours please contact Neurologist on call listed on AMION  ? ?Addendum:  ?Subclinical seizures x 3 on EEG  ?- Increase keppra to 1500 mg BID  ?- phenobarb level at noon, will increase if there is room  ?- continue LTM ? ?

## 2021-12-30 NOTE — Progress Notes (Signed)
Pt on EEG. CPT is on hold at this time. ? ?

## 2021-12-30 NOTE — Procedures (Addendum)
Patient Name: Victor Erickson  ?MRN: [id number]  ?Epilepsy Attending: Charlsie Quest  ?Referring Physician/Provider: Charlsie Quest, MD ?Duration: 12/29/2021 1742 to 12/30/2021 1742 ?  ?Patient history: 61yo M alcoholic patient found down for 2 days, with labs concerning for STEMI, out of the window for STEMI activation, also presenting with full neurologic deficit concerning for stroke versus postictal Todd's paralysis.  ?  ?Level of alertness: comatose ?  ?AEDs during EEG study: LEV, LCM ?  ?Technical aspects: This EEG study was done with scalp electrodes positioned according to the 10-20 International system of electrode placement. Electrical activity was acquired at a sampling rate of 500Hz  and reviewed with a high frequency filter of 70Hz  and a low frequency filter of 1Hz . EEG data were recorded continuously and digitally stored.  ?  ?Description: EEG showed lateralized periodic discharges in left hemisphere, maximal left frontal region, maximal F3 at 1.5hz  . Additionally, there is near continuous  generalized low amplitude 2-3hz  delta slowing admixed with 15-16hz  beta activity as well as 12- seconds of generalized eeg attenuation.  Hyperventilation and photic stimulation were not performed.    ? ?Three seizure without clinical signs were noted on 12/30/2021 at 0646, 0709 and 0729 arising from left hemisphere. EEG showed lateralized periodic discharges at 3hz  which appeared rhythmic at  times. Average duration of seizure was 45 seconds.  ?  ?ABNORMALITY ?-Seizure without clinical signs, left hemisphere, maximal left frontal region, maximal F3 ?- Lateralized periodic discharges, left hemisphere, maximal left frontal region, maximal F3 ?- Continuous slow, generalized  ?  ?IMPRESSION: ?This study showed three seizure without clinical signs were noted on 12/30/2021 at 0646, 0709 and 0729 arising from left hemisphere. Average duration of seizure was 45 seconds. There is also evidence of epileptogenicity in left  hemisphere, maximal left frontal region which is on the ictal-interictal continuum with high potential for seizure recurrence as well as profound diffuse encephalopathy, likely due to sedation.   ?   ? ?

## 2021-12-31 ENCOUNTER — Inpatient Hospital Stay (HOSPITAL_COMMUNITY): Payer: BC Managed Care – PPO

## 2021-12-31 LAB — BASIC METABOLIC PANEL
Anion gap: 6 (ref 5–15)
BUN: 15 mg/dL (ref 6–20)
CO2: 23 mmol/L (ref 22–32)
Calcium: 8 mg/dL — ABNORMAL LOW (ref 8.9–10.3)
Chloride: 112 mmol/L — ABNORMAL HIGH (ref 98–111)
Creatinine, Ser: 0.71 mg/dL (ref 0.61–1.24)
GFR, Estimated: >60 mL/min (ref >60)
Glucose, Bld: 168 mg/dL — ABNORMAL HIGH (ref 70–99)
Potassium: 4.1 mmol/L (ref 3.5–5.1)
Sodium: 141 mmol/L (ref 135–145)

## 2021-12-31 LAB — GLUCOSE, CAPILLARY
Glucose-Capillary: 136 mg/dL — ABNORMAL HIGH (ref 70–99)
Glucose-Capillary: 154 mg/dL — ABNORMAL HIGH (ref 70–99)
Glucose-Capillary: 114 mg/dL — ABNORMAL HIGH (ref 70–99)
Glucose-Capillary: 102 mg/dL — ABNORMAL HIGH (ref 70–99)
Glucose-Capillary: 114 mg/dL — ABNORMAL HIGH (ref 70–99)
Glucose-Capillary: 108 mg/dL — ABNORMAL HIGH (ref 70–99)

## 2021-12-31 LAB — CBC
HCT: 32.7 % — ABNORMAL LOW (ref 39.0–52.0)
Hemoglobin: 10.3 g/dL — ABNORMAL LOW (ref 13.0–17.0)
MCH: 31.3 pg (ref 26.0–34.0)
MCHC: 31.5 g/dL (ref 30.0–36.0)
MCV: 99.4 fL (ref 80.0–100.0)
Platelets: 186 10*3/uL (ref 150–400)
RBC: 3.29 MIL/uL — ABNORMAL LOW (ref 4.22–5.81)
RDW: 14.6 % (ref 11.5–15.5)
WBC: 13.7 10*3/uL — ABNORMAL HIGH (ref 4.0–10.5)
nRBC: 0.2 % (ref 0.0–0.2)

## 2021-12-31 LAB — MAGNESIUM: Magnesium: 1.6 mg/dL — ABNORMAL LOW (ref 1.7–2.4)

## 2021-12-31 LAB — PHOSPHORUS: Phosphorus: 2.3 mg/dL — ABNORMAL LOW (ref 2.5–4.6)

## 2021-12-31 IMAGING — CT CT HEAD W/O CM
4 of 5 series · 15 of 47 positions shown, 17 images · non-contrast
Comparison: MRI and CT [DATE]

CLINICAL DATA: Seizure disorder. Clinical worsening. Question brain
edema.



[Series 3: head without · axial · non-contrast · 0.46mm/px · z∈[-153,-58]mm · 4 of 33 slices shown]
[im 7/33  brain]
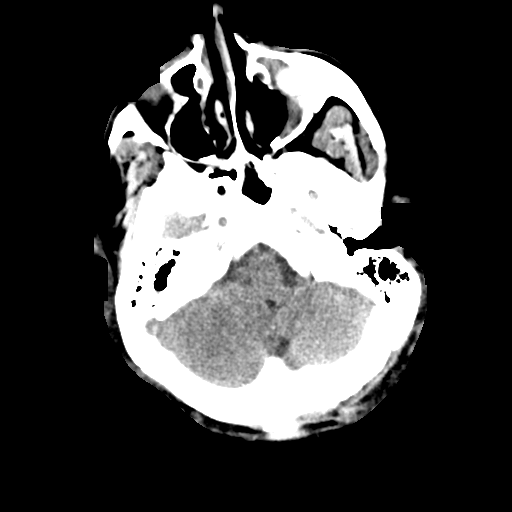
[im 13/33  brain]
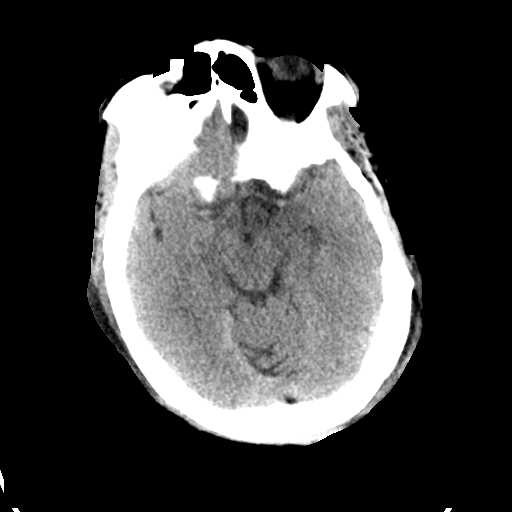
[im 20/33  brain]
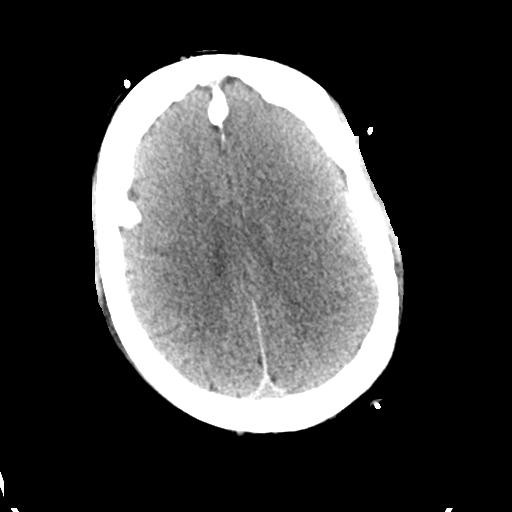
[im 26/33  brain]
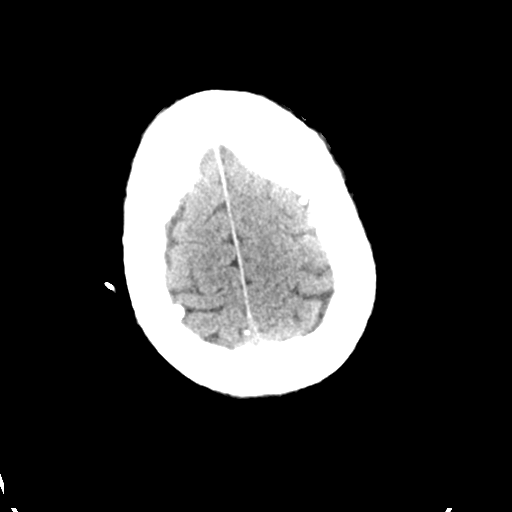

[Series 5: head without cor · coronal · non-contrast · 0.31mm/px · 3 of 71 slices shown]
[im 24/71  brain]
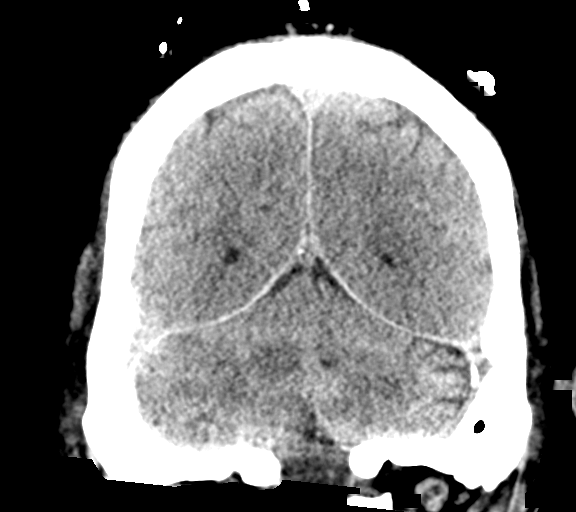
[im 32/71  brain]
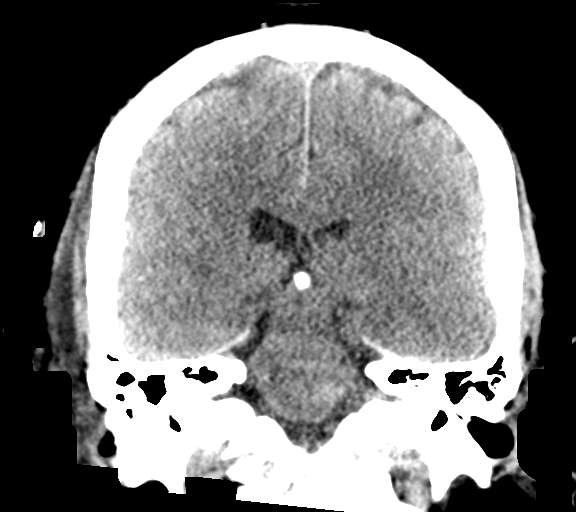
[im 39/71  brain]
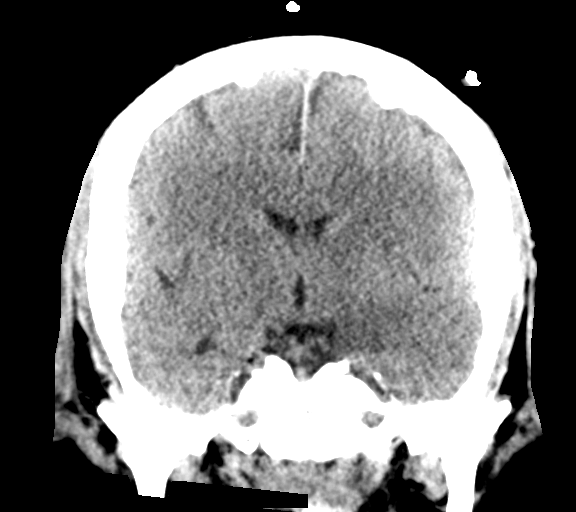

[Series 6: head without sag · sagittal · non-contrast · 0.31mm/px · 3 of 61 slices shown]
[im 21/61  brain]
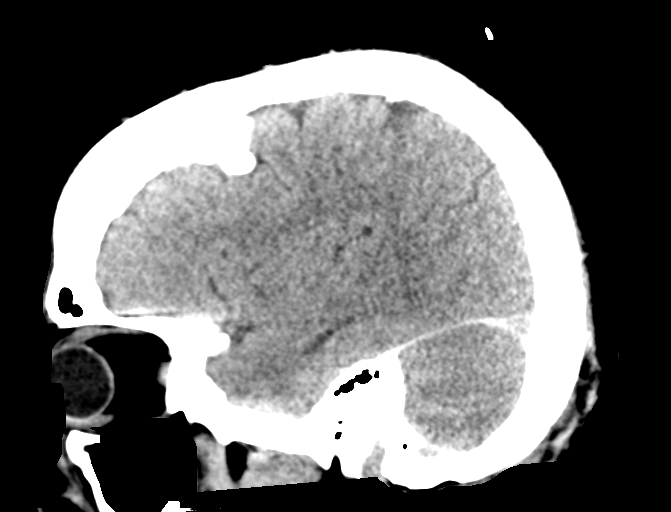
[im 31/61  brain]
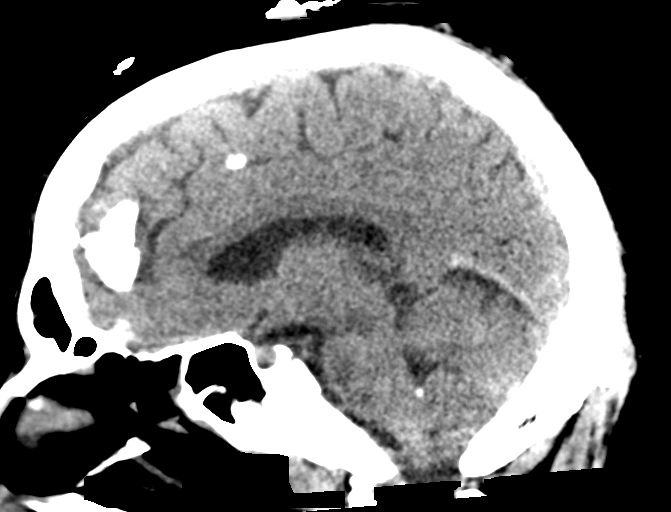
[im 41/61  brain]
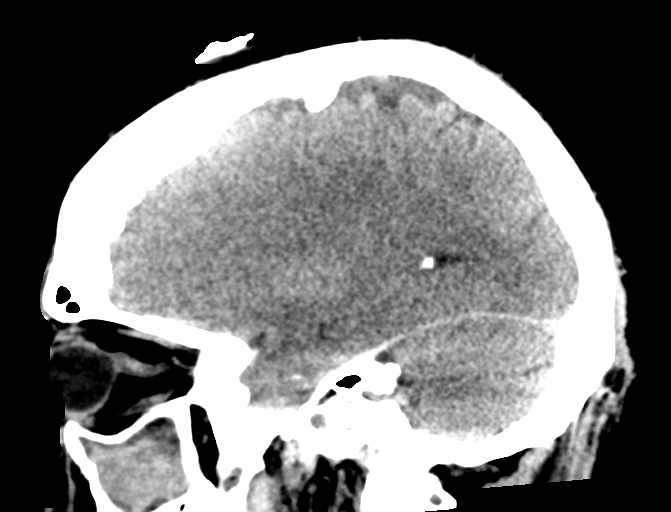

[Series 7: head without (person_name) · axial · non-contrast · 0.46mm/px · z∈[-158,-53]mm · 5 of 33 slices shown, 7 images]
[im 6/33  brain]
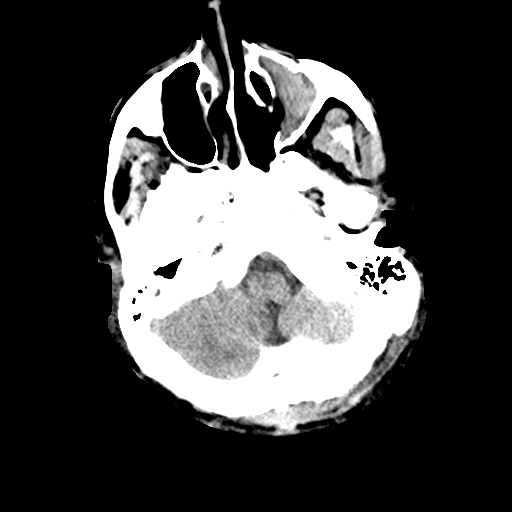
[im 6/33  bone]
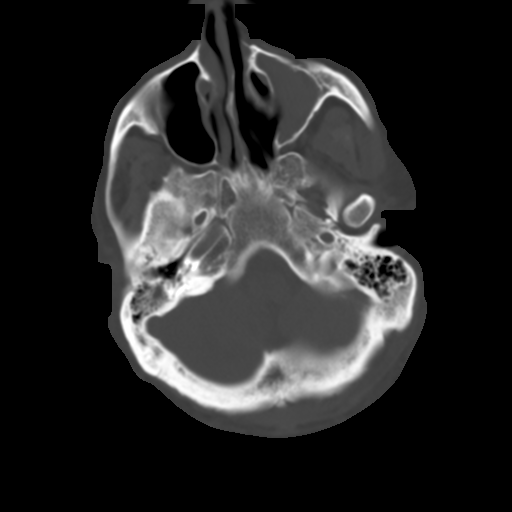
[im 11/33  brain]
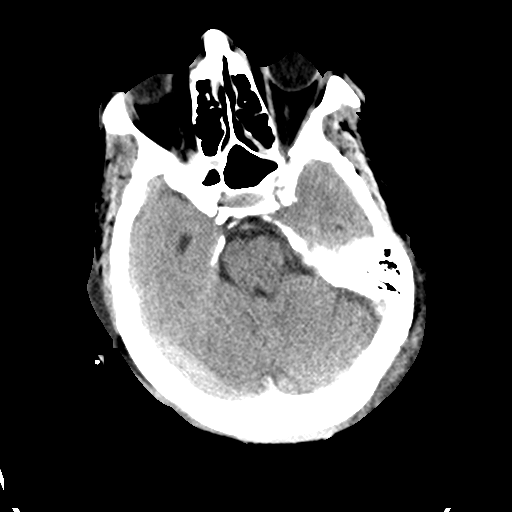
[im 17/33  brain]
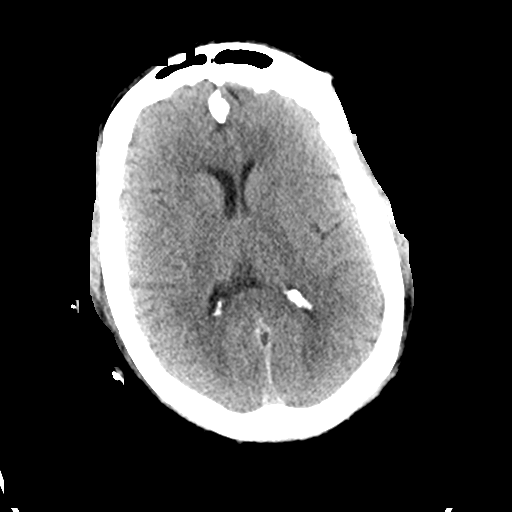
[im 22/33  brain]
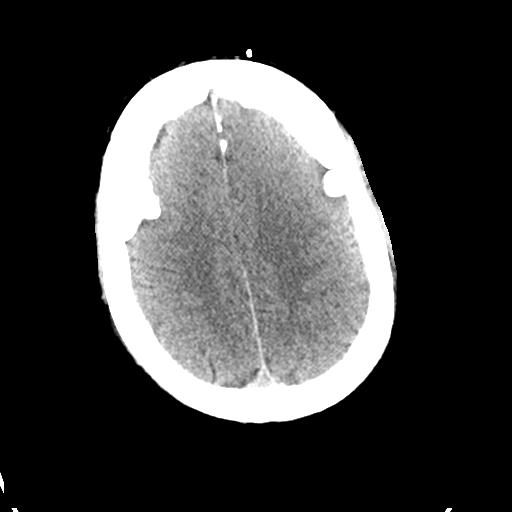
[im 27/33  brain]
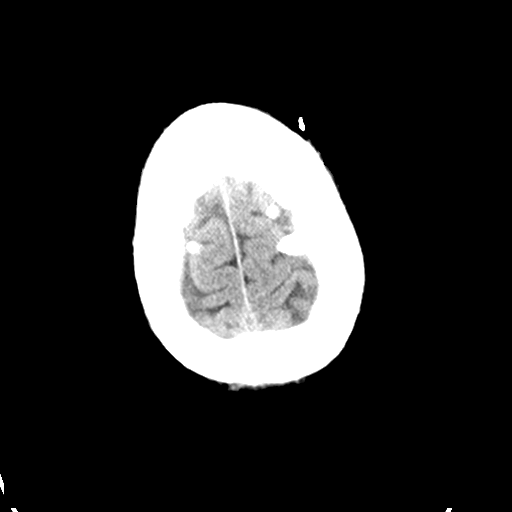
[im 27/33  bone]
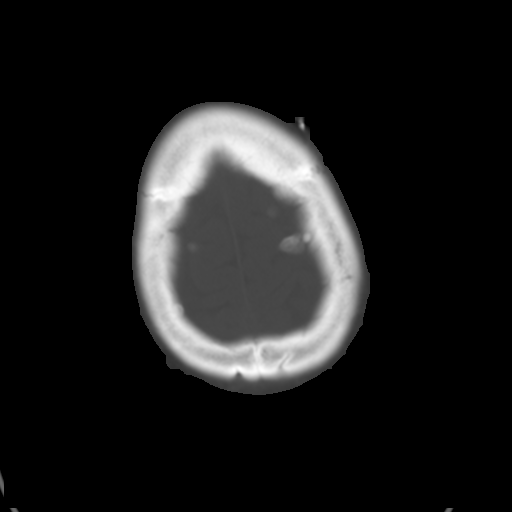

[15 of 47 positions shown; findings below may reference images not displayed]

FINDINGS: Brain: There may be mild residual edema within the inferior right
cerebellum and the left thalamus. Chronic encephalomalacia the
inferior frontal lobe on the left, presumably related to distant
trauma. No visible brain swelling of the left frontoparietal cortex.
No hemorrhage. No midline shift. No extra-axial collection.

Vascular: No abnormal vascular finding.

Skull: Hyperostosis frontalis interna as seen chronically.

Sinuses/Orbits: Opacification of the left maxillary sinus. Scattered
mucosal disease and opacification within the ethmoid and sphenoid
sinuses. Orbits negative.

Other: None
IMPRESSION: Probable mild residual edema in the inferior right cerebellum and
left thalamus. No worsening or new finding.

Chronic encephalomalacia of the inferior frontal lobe on the left,
presumably post traumatic.

Sinus inflammatory changes.

## 2021-12-31 MED ORDER — SODIUM PHOSPHATES 45 MMOLE/15ML IV SOLN
15.0000 mmol | Freq: Once | INTRAVENOUS | Status: AC
  Administered 2021-12-31: 15 mmol via INTRAVENOUS
  Filled 2021-12-31: qty 5

## 2021-12-31 MED ORDER — SODIUM CHLORIDE 0.9 % IV SOLN
2000.0000 mg | Freq: Two times a day (BID) | INTRAVENOUS | Status: DC
  Administered 2021-12-31 – 2022-01-09 (×18): 2000 mg via INTRAVENOUS
  Filled 2021-12-31 (×3): qty 20
  Filled 2021-12-31: qty 5
  Filled 2021-12-31 (×15): qty 20

## 2021-12-31 MED ORDER — PERAMPANEL 2 MG PO TABS
2.0000 mg | ORAL_TABLET | Freq: Every day | ORAL | Status: DC
  Administered 2022-01-01: 2 mg
  Filled 2021-12-31: qty 1

## 2021-12-31 MED ORDER — MAGNESIUM SULFATE 4 GM/100ML IV SOLN
4.0000 g | Freq: Once | INTRAVENOUS | Status: AC
  Administered 2021-12-31: 4 g via INTRAVENOUS
  Filled 2021-12-31: qty 100

## 2021-12-31 MED ORDER — PERAMPANEL 8 MG PO TABS
8.0000 mg | ORAL_TABLET | Freq: Once | ORAL | Status: AC
  Administered 2021-12-31: 8 mg
  Filled 2021-12-31: qty 1

## 2021-12-31 MED ORDER — MIDAZOLAM-SODIUM CHLORIDE 100-0.9 MG/100ML-% IV SOLN
10.0000 mg/h | INTRAVENOUS | Status: DC
  Administered 2021-12-31 – 2022-01-01 (×4): 10 mg/h via INTRAVENOUS
  Filled 2021-12-31 (×4): qty 100

## 2021-12-31 NOTE — Progress Notes (Signed)
LTM EEG maint complete - no skin breakdown under:  FP1 FP2 ground, A1 ?

## 2021-12-31 NOTE — TOC Progression Note (Addendum)
Transition of Care (TOC) - Progression Note  ? ? ?Patient Details  ?Name: Victor Erickson ?MRN: [id number] ?Date of Birth: [birth year] ? ?Transition of Care (TOC) CM/SW Contact  ?Tom-Johnson, Renea Ee, RN ?Phone Number: ?12/31/2021, 4:48 PM ? ?Clinical Narrative:    ? ?Patient continues to be intubated and on drips. On Continuous EEG. No TOC needs or recommendation noted at this time. CM will continue to follow with needs. ?  ? ?  ?  ? ?Expected Discharge Plan and Services ?  ?  ?  ?  ?  ?                ?  ?  ?  ?  ?  ?  ?  ?  ?  ?  ? ? ?Social Determinants of Health (SDOH) Interventions ?  ? ?Readmission Risk Interventions ?   ? View : No data to display.  ?  ?  ?  ? ? ?

## 2021-12-31 NOTE — Progress Notes (Signed)
? ?NAME:  Victor Erickson, MRN:  [id number], DOB:  February 02, 1961, LOS: 6 ?ADMISSION DATE:  12/25/2021, CONSULTATION DATE:  4/25 ?REFERRING MD: Dr. Karene Fry CHIEF COMPLAINT:  Found down  ? ?History of Present Illness:  ?61 y/o male with a history of alcoholism and seizrues who presented after being found down on 4/25. Required intubation for airway protection.  Noted to be in status epilepticus, rhabdomyolysis. Family reported that he decided to quit drinking last week. ? ?Pertinent  Medical History  ?Hypertension ?Hyperlipidemia ?Seizures in past ?Alcohol abuse ? ?Significant Hospital Events: ?Including procedures, antibiotic start and stop dates in addition to other pertinent events   ?4/25 admission, CTA head/neck NAICP, small area of encephalomalacial left ant frontal lobe likely from prior trauma, fluid throughout R scalp; CT C.AP> no hematoma, GGO RUL, infiltrates bilateral lower lobes, no RP hematoma, enlarged fatty liver; intubated, LTM started ?4/28 increased vimpat and phenobarb, continue keppra and wean propofol, continue versed infusion; stop zosyn, no seizures on EEG ?4/29 Fever, WBC increased ?4/30 Waking.  Ongoing seizures on EEG ? ?Interim History / Subjective:  ?Vent - PEEP 5%, FiO2 40%.  Weaned on PSV 12/5 on 4/30  ?Glucose range 136-168  ?Tmax 99.9 / WBC 13.7 in last 24 hours ?I/O 1.8L UOP, +6L in last 24 hours  ? ?Objective   ?Blood pressure 109/63, pulse 72, temperature 99.9 ?F (37.7 ?C), temperature source Axillary, resp. rate 17, height 6\' 1"  (1.854 m), weight 106.3 kg, SpO2 100 %. ?   ?Vent Mode: PRVC ?FiO2 (%):  [40 %] 40 % ?Set Rate:  [17 bmp] 17 bmp ?Vt Set:  mL] 630 mL ?PEEP:  [5 cmH20] 5 cmH20 ?Pressure Support:  [12 cmH20] 12 cmH20 ?Plateau Pressure:  [16 cmH20-17 cmH20] 16 cmH20  ? ?Intake/Output Summary (Last 24 hours) at 12/31/2021 0727 ?Last data filed at 12/31/2021 0600 ?Gross per 24 hour  ?Intake 7964.48 ml  ?Output 1800 ml  ?Net 6164.48 ml  ? ?Filed Weights  ? 12/27/21 0500 12/30/21  0500 12/31/21 0500  ?Weight: 100.2 kg 103.6 kg 106.3 kg  ? ? ?Examination: ?General: adult male lying in bed on vent, critically ill appearing  ?HEENT: MM pink/moist, ETT, cervical collar in place, soft tissue injury on right temple ?Neuro: eyes open, pupils reactive (R 87mm, L 41mm/irregular), no response to verbal stimuli ?CV: s1s2 RRR, no m/r/g ?PULM: non-labored on vent, lungs bilaterally clear ?GI: soft, bsx4 active, gastric tube on TF ?Extremities: warm/dry, trace dependent edema  ?Skin: no rashes or lesions ? ?Resolved Hospital Problem list   ?Aspiration Pneumonia ? ?Assessment & Plan:  ? ?Status Epilepticus  ?Requiring burst suppression > subclinical seizure 4/30  ?-continue vimpat, phenobarbital, keppra per Neuro  ?-appreciate Neurology assistance in patient care  ?-continue LTM EEG ? ?Found down, wearing hard collar per protocol ?No acute fracture on CT ?-change C-Collar to adult collar  ?-clear neck clinically when awake, keep collar in place until then  ? ?Alcohol Abuse ?-continue thiamine, folate, phenobarb wean  ?-monitor for evidence of withdrawal  ? ?Acute Respiratory Failure with Hypoxemia ?-PRVC 8cc/kg  ?-SBT as tolerated but mental status barrier for extubation  ?-VAP prevention measures  ?-follow intermittent CXR  ?-duoneb TID ? ?Rhabdomyolysis  ?Hypernatremia ?-Trend BMP / urinary output ?-Replace electrolytes as indicated ?-Avoid nephrotoxic agents, ensure adequate renal perfusion ?-reduce LR to 134ml/hr  ?-follow up CK in am  ? ?Need for Sedation for Mechanical Ventilation ?-PRN fentanyl only  ?-RASS goal 0 ? ?EtOH Hepatitis ?-follow intermittent LFT's ? ?Pressure  Injures POA ?-wound care per protocol  ? ?Urinary Retention ?-continue bethanecol, consider stopping 5/2  ?-condom catheter  ? ?Best Practice (right click and "Reselect all SmartList Selections" daily)  ?Diet/type: tubefeeds ?DVT prophylaxis: prophylactic heparin  ?GI prophylaxis: PPI ?Lines: N/A ?Foley:  N/A ?Code Status:  full  code ?Last date of multidisciplinary goals of care discussion: son and brother updated bedside on 4/30.  Will update on arrival 5/1 ? ?Critical care time: 34 minutes ?  ? ?Canary Brim, MSN, APRN, NP-C, AGACNP-BC ?[facility] ?12/31/2021, 7:35 AM ? ? ?Please see [email or web address] for pager details.  ? ?From 7A-7P if no response, please call [phone] ?After hours, please call ELink [phone] ? ?

## 2021-12-31 NOTE — Procedures (Addendum)
Patient Name: Victor Erickson  ?MRN: [id number]  ?Epilepsy Attending: Lora Havens  ?Referring Physician/Provider: Lora Havens, MD ?Duration: 12/30/2021 1742 to 12/31/2021 1742 ?  ?Patient history: [id number] M alcoholic patient found down for 2 days, with labs concerning for STEMI, out of the window for STEMI activation, also presenting with full neurologic deficit concerning for stroke versus postictal Todd's paralysis.  ?  ?Level of alertness: comatose ?  ?AEDs during EEG study: LEV, LCM ?  ?Technical aspects: This EEG study was done with scalp electrodes positioned according to the 10-20 International system of electrode placement. Electrical activity was acquired at a sampling rate of 500Hz  and reviewed with a high frequency filter of 70Hz  and a low frequency filter of 1Hz . EEG data were recorded continuously and digitally stored.  ?  ?Description: EEG showed lateralized periodic discharges in left hemisphere, maximal left frontal region, maximal F3 with fluctuating frequency of 1 to 3 Hz which at times appear rhythmic consistent with brief ictal-interictal rhythmic discharges. Additionally, there is near continuous  generalized low amplitude 2-3hz  delta slowing admixed with 15-16hz  beta activity as well as 12- seconds of generalized eeg attenuation. Hyperventilation and photic stimulation were not performed.    ?  ?ABNORMALITY ?-Brief ictal-interictal rhythmic discharges, left hemisphere, maximal left frontal region ?- Lateralized periodic discharges, left hemisphere, maximal left frontal region, maximal F3 ?- Continuous slow, generalized  ?  ?IMPRESSION: ?This study showed evidence of epileptogenicity in left hemisphere, maximal left frontal region which is on the ictal-interictal continuum with high potential for seizure recurrence as well as profound diffuse encephalopathy, likely due to sedation.   ?   ?Lora Havens ?

## 2021-12-31 NOTE — Progress Notes (Signed)
Orthopedic Tech Progress Note ?Patient Details:  ?Draco Donate ?10-28-1960 ?[id number] ? ?Secretary called requesting a new collar, patient had on a peds size collar, so I got him one from [employer]. ? ? Patient ID: Victor Erickson, male   DOB: 11/21/1960, 61 y.o.   MRN: [id number] ? ?Janit Pagan ?12/31/2021, 8:06 AM ? ?

## 2021-12-31 NOTE — Progress Notes (Signed)
Nutrition Follow-up ? ?DOCUMENTATION CODES:  ? ?Non-severe (moderate) malnutrition in context of social or environmental circumstances ? ?INTERVENTION:  ? ?Continue tube feeds via OG tube: ?- Vital 1.5 @ 60 ml/hr (1440 ml/day) ?- ProSource TF 45 ml TID ?- Free water flushes of 200 ml q 4 hours ? ?Tube feeding regimen provides 2280 kcal, 130 grams of protein, and 1100 ml of H2O. ? ?Total free water with flushes: 2300 ml ? ?- Continue MVI with minerals, folic acid, and thiamine per tube ? ?- Continue zinc sulfate 220 mg BID x 14 days for deficiency ? ?NUTRITION DIAGNOSIS:  ? ?Moderate Malnutrition related to social / environmental circumstances (relationship stress, EtOH abuse) as evidenced by mild fat depletion, moderate muscle depletion. ? ?Ongoing, being addressed via TF ? ?GOAL:  ? ?Patient will meet greater than or equal to 90% of their needs ? ?Met via TF ? ?MONITOR:  ? ?Vent status, Labs, Weight trends, TF tolerance, Skin, I & O's ? ?REASON FOR ASSESSMENT:  ? ?Ventilator, Consult ?Enteral/tube feeding initiation and management ? ?ASSESSMENT:  ? ?60-year-old male who presented to the ED on 4/25 with respiratory distress and AMS. PMH of HTN, HLD, and EtOH abuse. Pt admitted with NSTEMI, AKI, seizure activity, acute respiratory failure. ? ?Discussed pt with RN and during ICU rounds. Pt with ongoing seizure activity despite escalating antiepileptics. Plan for multidisciplinary meeting with family today. ? ?Tube feeding infusing at goal rate and pt tolerating without issue. Will continue with current tube feeding regimen. Zinc sulfate was ordered for deficiency. ? ?First measured weight: 93.4 kg on 4/26 ?Current weight: 108.7 kg ? ?Pt with moderate pitting generalized edema. ? ?Current TF: Vital 1.5 @ 60 ml/hr, ProSource TF 45 ml TID, free water flushes of 200 ml q 4 hours ? ?Patient is currently intubated on ventilator support ?MV: 9.9 L/min ?Temp (24hrs), Avg:99.6 ?F (37.6 ?C), Min:97.8 ?F (36.6 ?C), Max:100.7 ?F  (38.2 ?C) ? ?Drips: ?Versed ?LR: 100 ml/hr ? ?Medications reviewed and include: colace, folic acid, SSI q 4 hours, MVI with minerals daily, protonix, miralax, thiamine, zinc sulfate 220 mg BID ? ?Vitamin/Mineral Profile: ?Vitamin B12: 1731 (high) ?Vitamin B6: "unable to obtain valid result" ?Vitamin A: 30.5 (WNL) ?Vitamin C: 0.5 (low WNL) ?Zinc: 39 (low) ? ?Labs reviewed: phosphorus 2.3 on 5/1 (replaced), magnesium 1.6 on 5/1 (replaced), elevated LFTs, hemoglobin 9.5 ?CBG's: 102-128 x 24 hours ? ?UOP: 2000 ml x 24 hours ?Rectal tube: 700 ml x 24 hours ?I/O's: +23.9 L since admit ? ?Diet Order:   ?Diet Order   ? ?       ?  Diet NPO time specified  Diet effective now       ?  ? ?  ?  ? ?  ? ? ?EDUCATION NEEDS:  ? ?Not appropriate for education at this time ? ?Skin:  Skin Assessment: ?Skin Integrity Issues: ?DTI: back, R hip, R shoulder, face ? ?Last BM:  01/01/22 ? ?Height:  ? ?Ht Readings from Last 1 Encounters:  ?12/25/21 6' 1" (1.854 m)  ? ? ?Weight:  ? ?Wt Readings from Last 1 Encounters:  ?01/01/22 108.7 kg  ? ? ?BMI:  Body mass index is 31.62 kg/m?. ? ?Estimated Nutritional Needs:  ? ?Kcal:  2100-2300 ? ?Protein:  120-140 grams ? ?Fluid:  >2.0 L ? ? ? ?Kate , MS, RD, LDN ?Inpatient Clinical Dietitian ?Please see AMiON for contact information. ? ?

## 2021-12-31 NOTE — Progress Notes (Signed)
Pt transported on the ventilator on full support settings from 3M03 to CT2 and back without complication. RT, RN and transport accompanied pt. Upon arrival back to unit per Merry Proud NP CCM pt to remain on full support settings due to seizures. RT will continue to monitor and be available as needed. RN aware as well.  ?

## 2021-12-31 NOTE — Progress Notes (Signed)
EEG reviewed with Dr. Hortense Ramal, will resume versed at 16m/hr per request.  No physical evidence of seizure in extremities. Noted gaze down at to the right on initial exam this am. STAT CT head ordered per Neurology.  ? ? ?Noe Gens, MSN, APRN, NP-C, AGACNP-BC ?[facility] ?12/31/2021, 10:05 AM ? ? ?Please see [email or web address] for pager details.  ? ?From 7A-7P if no response, please call [phone] ?After hours, please call ELink [phone] ? ?

## 2021-12-31 NOTE — Progress Notes (Signed)
Subjective: No clinical seizures but appears to have more frequent brief ictal-interictal rhythmic discharges on EEG now. ?  ?ROS: Unable to obtain due to poor mental status ? ?Examination ? ?Vital signs in last 24 hours: ?Temp:  [99.3 ?F (37.4 ?C)-100 ?F (37.8 ?C)] 99.9 ?F (37.7 ?C) (05/01 1127) ?Pulse Rate:  [71-89] 71 (05/01 1215) ?Resp:  [15-26] 16 (05/01 1215) ?BP: (95-119)/(52-73) 98/59 (05/01 1200) ?SpO2:  [98 %-100 %] 99 % (05/01 1215) ?FiO2 (%):  [40 %] 40 % (05/01 1200) ?Weight:  [106.3 kg] 106.3 kg (05/01 0500) ? ?General: lying in bed, NAD ?RS: Intubated ?Neuro: Opens eyes to noxious stimulation but does not follow commands, right pupil is miotic and difficult to appreciate any reactivity, left pupil is deformed (Per family he has had an injury to his left eye before), no forced gaze deviation, corneal reflex present, gag reflex present, withdraws to noxious stimuli in all 4 extremities L>R  ? ?Basic Metabolic Panel: ?Recent Labs  ?Lab 12/27/21 ?0500 12/28/21 ?0641 12/29/21 ?0151 12/29/21 ?0426 12/30/21 ?6387 12/31/21 ?0156  ?NA 145 144 148* 149* 148* 141  ?K 3.2* 2.8* 3.9 4.1 3.9 4.1  ?CL 114* 115* 122*  --  120* 112*  ?CO2 19* 22 22  --  23 23  ?GLUCOSE 188* 163* 190*  --  183* 168*  ?BUN 62* 38* 24*  --  18 15  ?CREATININE 2.57* 1.27* 1.00  --  0.79 0.71  ?CALCIUM 8.1* 8.1* 8.0*  --  8.0* 8.0*  ?MG 2.5* 2.2 1.9  --  1.8 1.6*  ?PHOS 4.5 2.4* 1.6*  --  2.9 2.3*  ? ? ?CBC: ?Recent Labs  ?Lab 12/25/21 ?1300 12/26/21 ?0228 12/27/21 ?0500 12/28/21 ?0641 12/29/21 ?0151 12/29/21 ?0426 12/30/21 ?5643 12/31/21 ?0156  ?WBC 16.2*   < > 14.2* 13.0* 12.6*  --  15.2* 13.7*  ?NEUTROABS 13.4*  --   --   --   --   --   --   --   ?HGB 16.1   < > 13.5 12.6* 12.0* 10.5* 10.5* 10.3*  ?HCT 50.4   < > 40.1 37.7* 36.4* 31.0* 34.3* 32.7*  ?MCV 98.2   < > 93.3 95.2 96.8  --  100.0 99.4  ?PLT 167   < > 179 146* 141*  --  180 186  ? < > = values in this interval not displayed.  ? ? ? ?Coagulation Studies: ?No results for  input(s): LABPROT, INR in the last 72 hours. ? ?Imaging ?CT head without contrast 12/31/2021: Probable mild residual edema in the inferior right cerebellum and left thalamus. No worsening or new finding. ?Chronic encephalomalacia of the inferior frontal lobe on the left, ?presumably post traumatic. Sinus inflammatory changes. ? ? ? ?ASSESSMENT AND PLAN: 61 year old male who was found down and noted to have seizures arising from the left hemisphere. ?  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   ?  ?Recommendations ?-Start Versed at 10 ml/hr ?-If EEG worsens, can increase Versed further.  If EEG remains stable, will try to wean Versed again tomorrow ?-Increase Keppra to 2000 mg twice daily ?-Give one-time dose of perampanel 8 mg and start 2 mg daily from tomorrow ?-Continue Vimpat 200 mg twice daily and phenobarbital 65 mg every morning, 130 mg nightly.  We will check phenobarbital level ?-Continue LTM EEG  while patient is on sedation ?-CT head was ordered this morning and reviewed, no worsening or new findings ?-Management of rest of comorbidities per primary team ?-Discussed plan with critical care team ? ?  ?CRITICAL CARE ?Performed by: Charlsie Quest ?   ?Total critical care time: 35 minutes ?  ?Critical care time was exclusive of separately billable procedures and treating other patients. ?  ?Critical care was necessary to treat or prevent imminent or life-threatening deterioration. ?  ?Critical care was time spent personally by me on the following activities: development of treatment plan with patient and/or surrogate as well as nursing, discussions with consultants, evaluation of patient's response to treatment, examination of patient, obtaining history from patient or  surrogate, ordering and performing treatments and interventions, ordering and review of laboratory studies, ordering and review of radiographic studies, pulse oximetry and re-evaluation of patient's condition. ? ? ? ?Lindie Spruce ?Epilepsy ?[facility] ?For questions after 5pm please refer to AMION to reach the Neurologist on call ? ?

## 2021-12-31 NOTE — Progress Notes (Signed)
[facility] ADULT ICU REPLACEMENT PROTOCOL ? ? ?The patient does apply for the [facility] Adult ICU Electrolyte Replacment Protocol based on the criteria listed below:  ? ?1.Exclusion criteria: TCTS patients, ECMO patients, and Dialysis patients ?2. Is GFR >/= 30 ml/min? Yes.    ?Patient's GFR today is >60 ?3. Is SCr </= 2? Yes.   ?Patient's SCr is 0.71 mg/dL ?4. Did SCr increase >/= 0.5 in 24 hours? No. ?5.Pt's weight >40kg  Yes.   ?6. Abnormal electrolyte(s): Mag 1.6, Phos 2.3  ?7. Electrolytes replaced per protocol ?8.  Call MD STAT for K+ </= 2.5, Phos </= 1, or Mag </= 1 ?Physician:  Dr Warrick Parisian ? ?Lolita Lenz 12/31/2021 4:52 AM  ?

## 2022-01-01 ENCOUNTER — Inpatient Hospital Stay (HOSPITAL_COMMUNITY): Payer: BC Managed Care – PPO

## 2022-01-01 LAB — CBC
HCT: 29.8 % — ABNORMAL LOW (ref 39.0–52.0)
Hemoglobin: 9.5 g/dL — ABNORMAL LOW (ref 13.0–17.0)
MCH: 31.3 pg (ref 26.0–34.0)
MCHC: 31.9 g/dL (ref 30.0–36.0)
MCV: 98 fL (ref 80.0–100.0)
Platelets: 229 10*3/uL (ref 150–400)
RBC: 3.04 MIL/uL — ABNORMAL LOW (ref 4.22–5.81)
RDW: 14.1 % (ref 11.5–15.5)
WBC: 10.1 10*3/uL (ref 4.0–10.5)
nRBC: 0.2 % (ref 0.0–0.2)

## 2022-01-01 LAB — GLUCOSE, CAPILLARY
Glucose-Capillary: 107 mg/dL — ABNORMAL HIGH (ref 70–99)
Glucose-Capillary: 111 mg/dL — ABNORMAL HIGH (ref 70–99)
Glucose-Capillary: 115 mg/dL — ABNORMAL HIGH (ref 70–99)
Glucose-Capillary: 126 mg/dL — ABNORMAL HIGH (ref 70–99)
Glucose-Capillary: 128 mg/dL — ABNORMAL HIGH (ref 70–99)
Glucose-Capillary: 92 mg/dL (ref 70–99)

## 2022-01-01 LAB — COMPREHENSIVE METABOLIC PANEL WITH GFR
ALT: 54 U/L — ABNORMAL HIGH (ref 0–44)
AST: 82 U/L — ABNORMAL HIGH (ref 15–41)
Albumin: <1.5 g/dL — ABNORMAL LOW (ref 3.5–5.0)
Alkaline Phosphatase: 52 U/L (ref 38–126)
Anion gap: 6 (ref 5–15)
BUN: 14 mg/dL (ref 6–20)
CO2: 24 mmol/L (ref 22–32)
Calcium: 8 mg/dL — ABNORMAL LOW (ref 8.9–10.3)
Chloride: 110 mmol/L (ref 98–111)
Creatinine, Ser: 0.61 mg/dL (ref 0.61–1.24)
GFR, Estimated: >60 mL/min
Glucose, Bld: 110 mg/dL — ABNORMAL HIGH (ref 70–99)
Potassium: 4 mmol/L (ref 3.5–5.1)
Sodium: 140 mmol/L (ref 135–145)
Total Bilirubin: 0.4 mg/dL (ref 0.3–1.2)
Total Protein: 5.4 g/dL — ABNORMAL LOW (ref 6.5–8.1)

## 2022-01-01 LAB — PHENOBARBITAL LEVEL: Phenobarbital: 6.7 ug/mL — ABNORMAL LOW (ref 15.0–40.0)

## 2022-01-01 LAB — CK: Total CK: 1152 U/L — ABNORMAL HIGH (ref 49–397)

## 2022-01-01 IMAGING — DX DG CHEST 1V PORT
1 series · 1 of 1 positions shown · non-contrast
Comparison: [DATE]

CLINICAL DATA: Respiratory failure.

EXAM:
PORTABLE CHEST 1 VIEW

[chest]
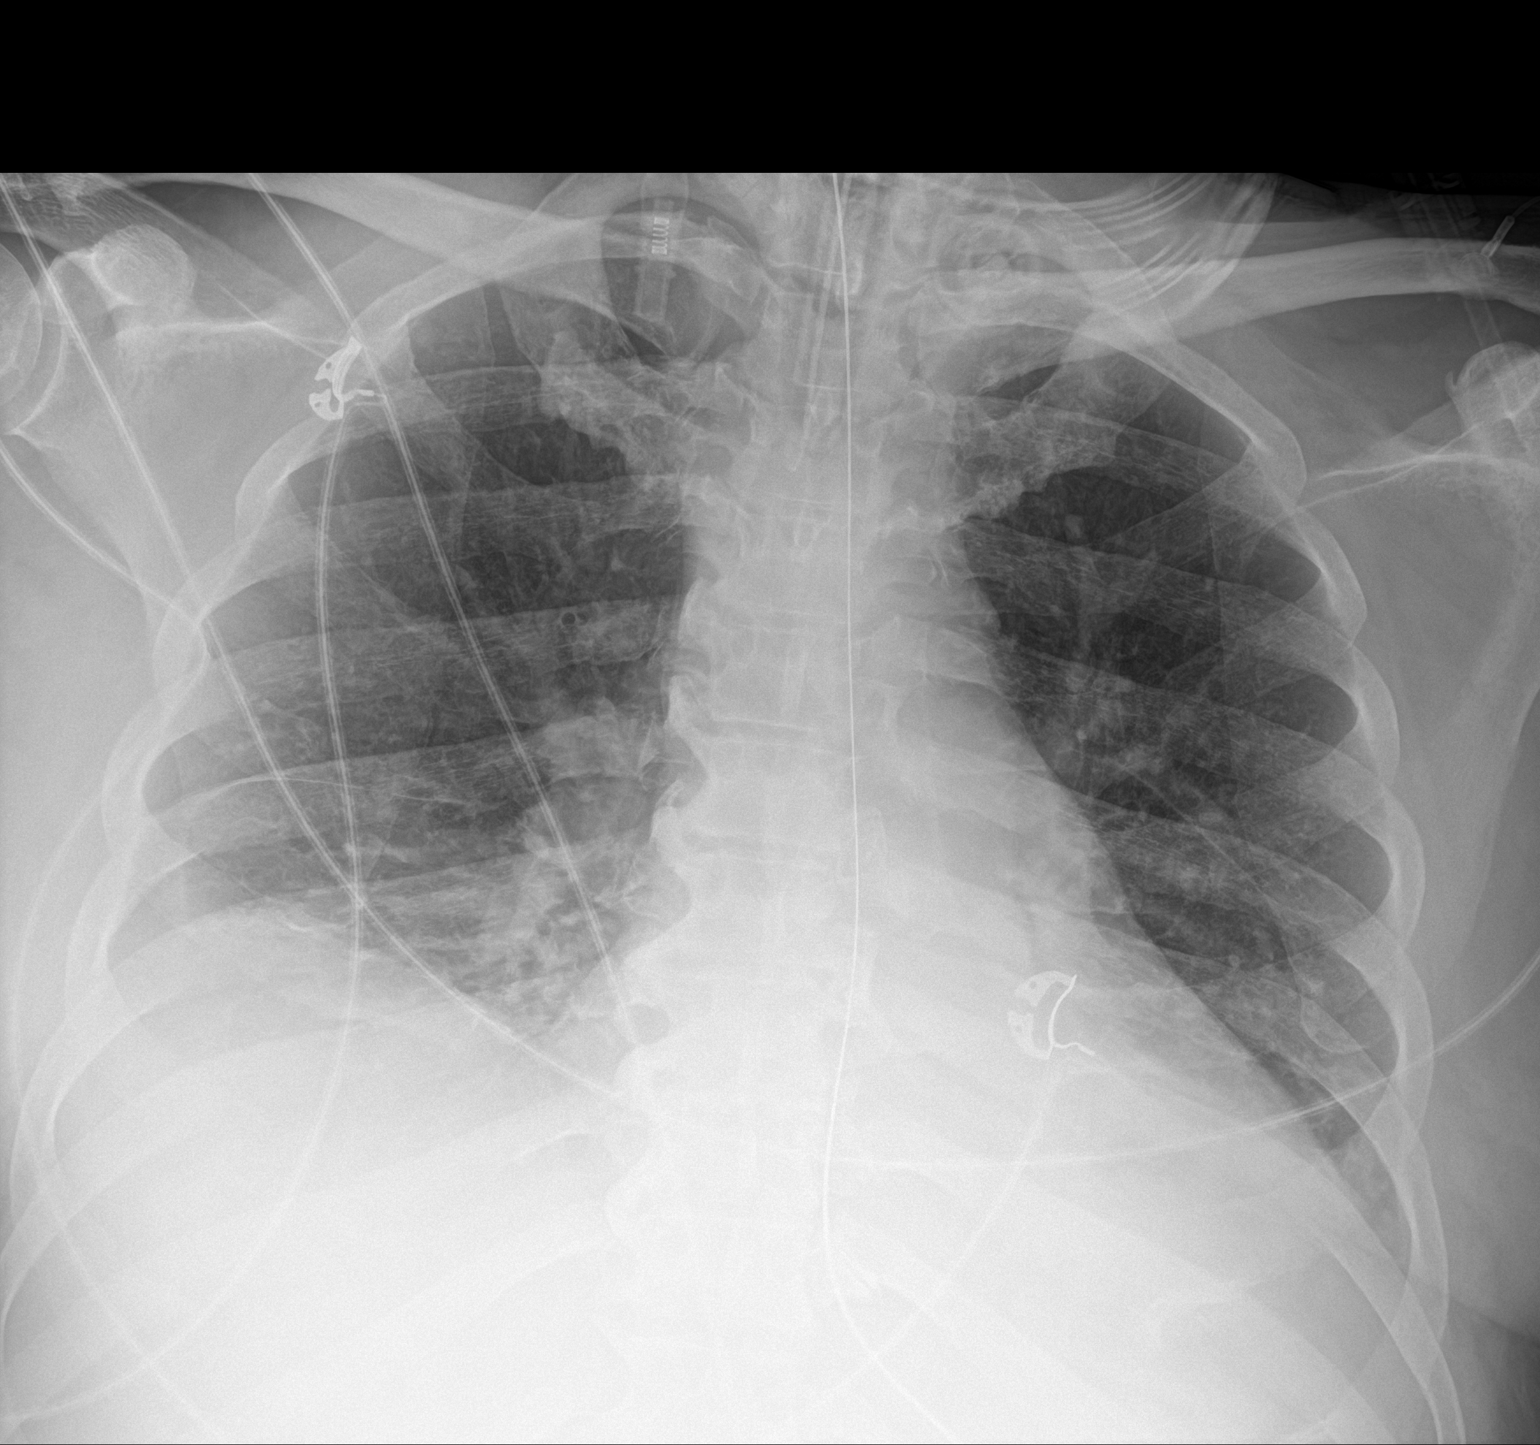

[1 of 1 positions shown; findings below may reference images not displayed]

FINDINGS: Endotracheal tube remains with the tip approximately 6 cm above the
carina. Gastric decompression tube extends below the diaphragm.
Lungs demonstrate some increase in right basilar opacity
representing either atelectasis or pneumonia. No pulmonary edema,
significant volume pleural effusions or pneumothorax identified.
IMPRESSION: Increase in right basilar opacity representing either atelectasis or
pneumonia.

## 2022-01-01 MED ORDER — PHENOBARBITAL SODIUM 65 MG/ML IJ SOLN
65.0000 mg | Freq: Once | INTRAMUSCULAR | Status: AC
  Administered 2022-01-01: 65 mg via INTRAVENOUS
  Filled 2022-01-01: qty 1

## 2022-01-01 MED ORDER — PHENOBARBITAL SODIUM 130 MG/ML IJ SOLN
130.0000 mg | Freq: Two times a day (BID) | INTRAMUSCULAR | Status: DC
  Administered 2022-01-01 – 2022-01-05 (×8): 130 mg via INTRAVENOUS
  Filled 2022-01-01 (×8): qty 1

## 2022-01-01 NOTE — Progress Notes (Signed)
CCM notified of continued swelling to pt upper lip making it difficult to move ETT. Will come to pt bedside to evaluate. RT will continue to monitor and be available. ?

## 2022-01-01 NOTE — Progress Notes (Addendum)
? ?NAME:  Victor Erickson, MRN:  [id number], DOB:  08/04/1961, LOS: 7 ?ADMISSION DATE:  12/25/2021, CONSULTATION DATE:  4/25 ?REFERRING MD: Dr. Karene Fry CHIEF COMPLAINT:  Found down  ? ?History of Present Illness:  ?61 y/o male with a history of alcoholism and seizrues who presented after being found down on 4/25. Required intubation for airway protection.  Noted to be in status epilepticus, rhabdomyolysis. Family reported that he decided to quit drinking last week. ? ?Pertinent  Medical History  ?Hypertension ?Hyperlipidemia ?Seizures in past ?Alcohol abuse ? ?Significant Hospital Events: ?Including procedures, antibiotic start and stop dates in addition to other pertinent events   ?4/25 admission, CTA head/neck NAICP, small area of encephalomalacial left ant frontal lobe likely from prior trauma, fluid throughout R scalp; CT C.AP> no hematoma, GGO RUL, infiltrates bilateral lower lobes, no RP hematoma, enlarged fatty liver; intubated, LTM started ?4/28 increased vimpat and phenobarb, continue keppra and wean propofol, continue versed infusion; stop zosyn, no seizures on EEG ?4/29 Fever, WBC increased ?4/30 Waking.  Ongoing seizures on EEG ?5/1 concern for ongoing seizure on EEG ? ?Interim History / Subjective:  ?Remains on vent - 40%, PEEP 5  ?EEG worrisome for epileptiform discharges indicative of ictal nature, diffuse encephalopathy  ?Tmax 99.3  ?I/O 2L UOP, 700 ml stool, 3.6L+ in last 24 hours  ? ?Objective   ?Blood pressure 106/67, pulse 84, temperature 99.3 ?F (37.4 ?C), temperature source Oral, resp. rate 18, height 6\' 1"  (1.854 m), weight 108.7 kg, SpO2 100 %. ?   ?Vent Mode: PRVC ?FiO2 (%):  [40 %] 40 % ?Set Rate:  [16 bmp] 16 bmp ?Vt Set:  mL] 630 mL ?PEEP:  [5 cmH20] 5 cmH20 ?Plateau Pressure:  [16 cmH20-19 cmH20] 16 cmH20  ? ?Intake/Output Summary (Last 24 hours) at 01/01/2022 1038 ?Last data filed at 01/01/2022 1000 ?Gross per 24 hour  ?Intake 5995.9 ml  ?Output 2250 ml  ?Net 3745.9 ml  ? ?Filed Weights   ? 12/30/21 0500 12/31/21 0500 01/01/22 0425  ?Weight: 103.6 kg 106.3 kg 108.7 kg  ? ? ?Examination: ?General: critically ill appearing adult male lying in bed on vent in NAD ?HEENT: MM pink/moist, ETT, right pupil 74mm, left irregular ~66mm, cervical collar in place ?Neuro: sedate on versed infusion, flickers eyes to voice, moves eyes toward provider, does not follow commands ?CV: s1s2 RRR, no m/r/g ?PULM: non-labored at rest, lungs clear bilaterally  ?GI: soft, bsx4 active  ?Extremities: warm/dry, trace edema  ?Skin: no rashes or lesions ? ?Resolved Hospital Problem list   ?Aspiration Pneumonia ? ?Assessment & Plan:  ? ?Status Epilepticus  ?Requiring burst suppression > subclinical seizure 4/30 > ongoing through 5/2  ?-continue phenobarbital, consider increase given level but will defer to Neuro  ?-continue vimpat, keppra, perapanel ?-appreciate Neurology assistance with patient care  ?-continue LTM EEG  ? ?Found down, wearing hard collar per protocol ?No acute fracture on CT but has not been able to clear from soft tissue standpoint  ?-clear neck when clinically awake/able to participate in exam ? ?Alcohol Abuse ?-continue folate, thiamine ? ?Acute Respiratory Failure with Hypoxemia ?-PRVC 8cc/kg  ?-mental status remains barrier for extubation  ?-VAP prevention measures  ?-follow intermittent CXR, monitor RLL atelectasis (no fever or WBC) ?-continue duoneb TID  ?-advance ETT 2 cm  ? ?Rhabdomyolysis  ?Hypernatremia ?-reduce LR to 50 ml/hr  ?-free water PT  ?-Trend BMP / urinary output ?-Replace electrolytes as indicated ?-Avoid nephrotoxic agents, ensure adequate renal perfusion ? ?Need for Sedation for  Mechanical Ventilation ?-Versed infusion per Neuro for subclinical seizure  ?-PRN fentanyl  ?-burst suppression goal independent of RASS  ? ?EtOH Hepatitis ?-follow LFT's  ? ?Pressure Injures POA ?-wound care per protocol   ? ?Urinary Retention ?-condom catheter  ?-follow I/O's  ? ?Best Practice (right click and  "Reselect all SmartList Selections" daily)  ?Diet/type: tubefeeds ?DVT prophylaxis: prophylactic heparin  ?GI prophylaxis: PPI ?Lines: N/A ?Foley:  N/A ?Code Status:  full code ?Last date of multidisciplinary goals of care discussion: 5/2 wife updated via phone on plan of care.  Reviewed concerns regarding ongoing seizure activity.  Wife & son are planning to visit and we will have multidisciplinary meeting with family. Support offered.   ? ?Critical care time: 35 minutes ?  ? ?Canary Brim, MSN, APRN, NP-C, AGACNP-BC ? Pulmonary & Critical Care ?01/01/2022, 10:38 AM ? ? ?Please see [email or web address] for pager details.  ? ?From 7A-7P if no response, please call [phone] ?After hours, please call ELink [phone] ? ?

## 2022-01-01 NOTE — Progress Notes (Signed)
Subjective: No clinical seizures overnight.  However EEG continues to show lateralized periodic discharges at 2 to 4 Hz. ? ?ROS: Unable to obtain due to poor mental status ? ?Examination ? ?Vital signs in last 24 hours: ?Temp:  [97.8 ?F (36.6 ?C)-100.7 ?F (38.2 ?C)] 97.8 ?F (36.6 ?C) (05/02 1141) ?Pulse Rate:  [68-84] 73 (05/02 1200) ?Resp:  [16-18] 16 (05/02 1200) ?BP: (86-114)/(53-71) 106/58 (05/02 1200) ?SpO2:  [98 %-100 %] 100 % (05/02 1200) ?FiO2 (%):  [40 %] 40 % (05/02 0803) ?Weight:  [108.7 kg] 108.7 kg (05/02 0425) ? ?General: lying in bed, NAD ?RS: Intubated ?Neuro: Opens eyes to noxious stimulation but does not follow commands, right pupil is miotic and difficult to appreciate any reactivity, left pupil is deformed (Per family he has had an injury to his left eye before), no forced gaze deviation, corneal reflex present, gag reflex present, withdraws to noxious stimuli in all 4 extremities L>R  ? ?Basic Metabolic Panel: ?Recent Labs  ?Lab 12/27/21 ?0500 12/28/21 ?0641 12/29/21 ?0151 12/29/21 ?0426 12/30/21 ?2426 12/31/21 ?8341 01/01/22 ?0209  ?NA 145 144 148* 149* 148* 141 140  ?K 3.2* 2.8* 3.9 4.1 3.9 4.1 4.0  ?CL 114* 115* 122*  --  120* 112* 110  ?CO2 19* 22 22  --  23 23 24   ?GLUCOSE 188* 163* 190*  --  183* 168* 110*  ?BUN 62* 38* 24*  --  18 15 14   ?CREATININE 2.57* 1.27* 1.00  --  0.79 0.71 0.61  ?CALCIUM 8.1* 8.1* 8.0*  --  8.0* 8.0* 8.0*  ?MG 2.5* 2.2 1.9  --  1.8 1.6*  --   ?PHOS 4.5 2.4* 1.6*  --  2.9 2.3*  --   ? ? ?CBC: ?Recent Labs  ?Lab 12/25/21 ?1300 12/26/21 ?0228 12/28/21 ?0641 12/29/21 ?0151 12/29/21 ?0426 12/30/21 ?12/31/21 12/31/21 ?9622 01/01/22 ?0209  ?WBC 16.2*   < > 13.0* 12.6*  --  15.2* 13.7* 10.1  ?NEUTROABS 13.4*  --   --   --   --   --   --   --   ?HGB 16.1   < > 12.6* 12.0* 10.5* 10.5* 10.3* 9.5*  ?HCT 50.4   < > 37.7* 36.4* 31.0* 34.3* 32.7* 29.8*  ?MCV 98.2   < > 95.2 96.8  --  100.0 99.4 98.0  ?PLT 167   < > 146* 141*  --  180 186 229  ? < > = values in this interval not  displayed.  ? ? ? ?Coagulation Studies: ?No results for input(s): LABPROT, INR in the last 72 hours. ? ?Imaging ?No new brain imaging overnight ? ?ASSESSMENT AND PLAN: 61 year old male who was found down and noted to have seizures arising from the left hemisphere. ?  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  The frequency of epileptiform discharges goes over 3 Hz frequently which is most likely indicative of ictal nature of these discharges.  Additionally there is profound diffuse encephalopathy, likely due to sedation. ?  ?Recommendations ?-Due to worsening EEG, will discuss with family today about goals of care. ?-In the interim, increase phenobarbital to 130 mg twice daily and continue Versed at 10 ml/hr ?-Continue Keppra 2000 mg twice daily, Vimpat 200 mg twice daily, perampanel 2 mg nightly ?-Continue LTM EEG while patient is on sedation ?-Management of rest of comorbidities per  primary team ?-Discussed plan with critical care team ? ?ADDENDUM ?-Discussed with patient's significant other, son, son's wife as well as a brother that after weaning sedation patient's seizures have recurred.  At this point we could either increase sedation (Versed and ketamine) again and attempt another round of burst suppression or wean sedation and assess response.  Also discussed that if we do another round of her suppression, there is high likelihood that patient may require trach/PEG ?-Per family, patient was a very independent person and would not want prolonged life support, trach, PEG.  They will discuss amongst themselves and let us know if they would prefer another round of her suppression or wean sedation and assess for response. ? ?CRITICAL CARE ?Performed by: Charlsie Quest ?   ?Total  critical care time: ?  ?Critical care time was exclusive of separately billable procedures and treating other patients. ?  ?Critical care was necessary to treat or prevent imminent or life-threatening deterioration. ?  ?Critical care was time spent personally by me on the following activities: development of treatment plan with patient and/or surrogate as well as nursing, discussions with consultants, evaluation of patient's response to treatment, examination of patient, obtaining history from patient or surrogate, ordering and performing treatments and interventions, ordering and review of laboratory studies, ordering and review of radiographic studies, pulse oximetry and re-evaluation of patient's condition. ?  ? ?Lindie Spruce ?Epilepsy ?[facility] ?For questions after 5pm please refer to AMION to reach the Neurologist on call ? ?

## 2022-01-01 NOTE — Procedures (Addendum)
Patient Name: Victor Erickson  ?MRN: [id number]  ?Epilepsy Attending: Lora Havens  ?Referring Physician/Provider: Lora Havens, MD ?Duration: 12/31/2021 1742 to 01/01/2022 1742 ?  ?Patient history: [id number] M alcoholic patient found down for 2 days, with labs concerning for STEMI, out of the window for STEMI activation, also presenting with full neurologic deficit concerning for stroke versus postictal Todd's paralysis.  ?  ?Level of alertness: comatose ?  ?AEDs during EEG study: LEV, LCM, phenobarbital, perampanel, Versed ?  ?Technical aspects: This EEG study was done with scalp electrodes positioned according to the 10-20 International system of electrode placement. Electrical activity was acquired at a sampling rate of 500Hz  and reviewed with a high frequency filter of 70Hz  and a low frequency filter of 1Hz . EEG data were recorded continuously and digitally stored.  ?  ?Description: EEG showed lateralized periodic discharges in left hemisphere, maximal left frontal region, maximal F3 with fluctuating frequency of 2 to 4 Hz which at times appear rhythmic consistent with brief ictal-interictal rhythmic discharges. Additionally, there is near continuous  generalized low amplitude 2-3hz  delta slowing admixed with 15-16hz  beta activity as well as 1-2 seconds of generalized eeg attenuation. Hyperventilation and photic stimulation were not performed.    ?  ?ABNORMALITY ?-Brief ictal-interictal rhythmic discharges, left hemisphere, maximal left frontal region ?- Lateralized periodic discharges, left hemisphere, maximal left frontal region, maximal F3 ?- Continuous slow, generalized  ?  ?IMPRESSION: ?This study showed evidence of epileptogenicity in left hemisphere, maximal left frontal region which is on the ictal-interictal continuum.  The frequency of epileptiform discharges goes over 3 Hz frequently which is most likely indicative of ictal nature of these discharges.  Additionally there is profound diffuse  encephalopathy, likely due to sedation.   ?   ?Lora Havens ?

## 2022-01-01 NOTE — Progress Notes (Signed)
EEG maint complete.  ?

## 2022-01-02 ENCOUNTER — Inpatient Hospital Stay (HOSPITAL_COMMUNITY): Payer: BC Managed Care – PPO

## 2022-01-02 LAB — CBC
HCT: 29.4 % — ABNORMAL LOW (ref 39.0–52.0)
Hemoglobin: 9.3 g/dL — ABNORMAL LOW (ref 13.0–17.0)
MCH: 30.7 pg (ref 26.0–34.0)
MCHC: 31.6 g/dL (ref 30.0–36.0)
MCV: 97 fL (ref 80.0–100.0)
Platelets: 238 10*3/uL (ref 150–400)
RBC: 3.03 MIL/uL — ABNORMAL LOW (ref 4.22–5.81)
RDW: 14 % (ref 11.5–15.5)
WBC: 11.6 10*3/uL — ABNORMAL HIGH (ref 4.0–10.5)
nRBC: 0 % (ref 0.0–0.2)

## 2022-01-02 LAB — BASIC METABOLIC PANEL
Anion gap: 6 (ref 5–15)
BUN: 13 mg/dL (ref 6–20)
CO2: 25 mmol/L (ref 22–32)
Calcium: 7.9 mg/dL — ABNORMAL LOW (ref 8.9–10.3)
Chloride: 105 mmol/L (ref 98–111)
Creatinine, Ser: 0.59 mg/dL — ABNORMAL LOW (ref 0.61–1.24)
GFR, Estimated: >60 mL/min (ref >60)
Glucose, Bld: 121 mg/dL — ABNORMAL HIGH (ref 70–99)
Potassium: 4.1 mmol/L (ref 3.5–5.1)
Sodium: 136 mmol/L (ref 135–145)

## 2022-01-02 LAB — GLUCOSE, CAPILLARY
Glucose-Capillary: 136 mg/dL — ABNORMAL HIGH (ref 70–99)
Glucose-Capillary: 153 mg/dL — ABNORMAL HIGH (ref 70–99)
Glucose-Capillary: 120 mg/dL — ABNORMAL HIGH (ref 70–99)
Glucose-Capillary: 150 mg/dL — ABNORMAL HIGH (ref 70–99)
Glucose-Capillary: 155 mg/dL — ABNORMAL HIGH (ref 70–99)
Glucose-Capillary: 144 mg/dL — ABNORMAL HIGH (ref 70–99)

## 2022-01-02 LAB — CALCIUM, IONIZED: Calcium, Ionized, Serum: 4.3 mg/dL — ABNORMAL LOW (ref 4.5–5.6)

## 2022-01-02 IMAGING — DX DG CHEST 1V PORT
1 series · 1 of 1 positions shown · non-contrast
Comparison: [DATE]

CLINICAL DATA: Shortness of breath

EXAM:
PORTABLE CHEST 1 VIEW

[chest]
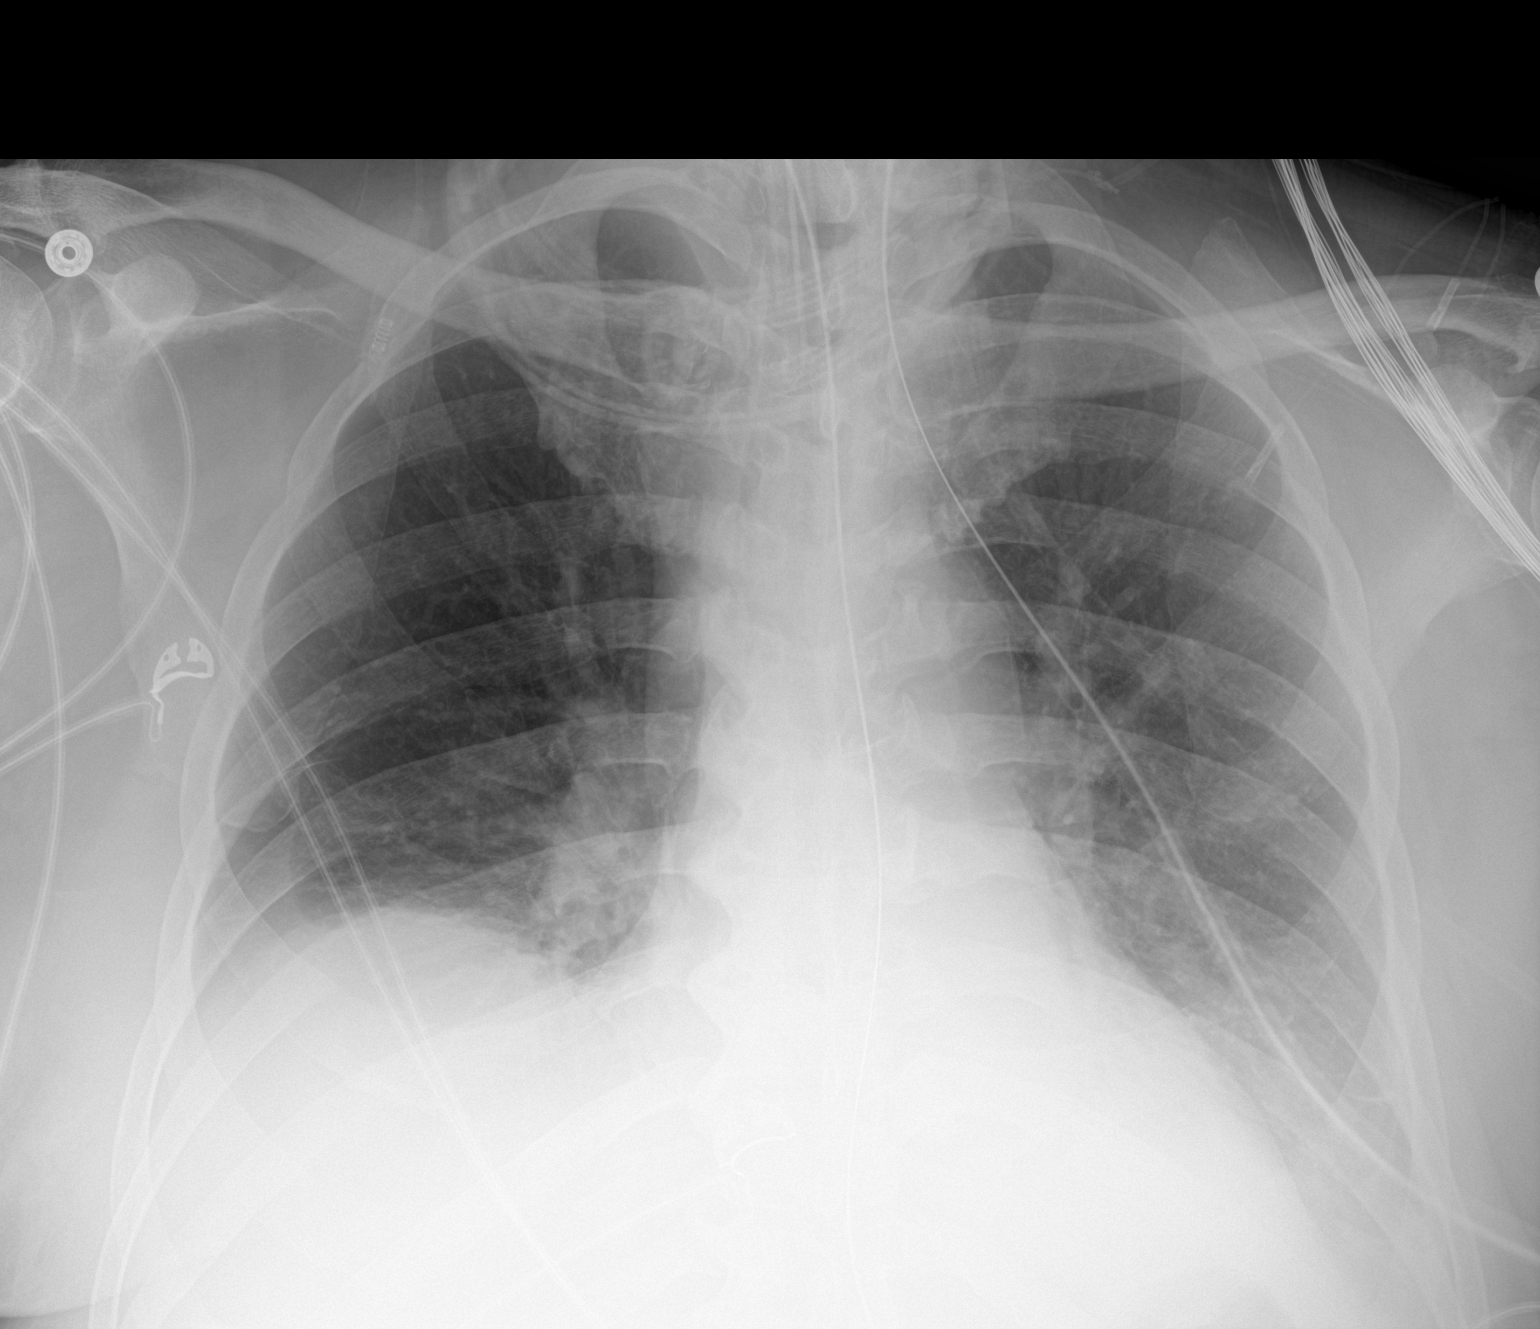

[1 of 1 positions shown; findings below may reference images not displayed]

FINDINGS: Single frontal view of the chest demonstrates endotracheal tube
overlying tracheal air column, tip just below thoracic inlet.
Enteric catheter passes below the diaphragm tip excluded by
collimation. Cardiac silhouette is stable. There are bibasilar
veiling opacities, left greater than right, slightly more pronounced
since prior study. No pneumothorax. No acute bony abnormalities.
IMPRESSION: 1. Bibasilar veiling opacities, left greater than right, consistent
with consolidation and/or effusion. Slight progression since prior
study.
2. Support devices as above.

## 2022-01-02 MED ORDER — DIAZEPAM 5 MG/ML IJ SOLN
5.0000 mg | Freq: Three times a day (TID) | INTRAMUSCULAR | Status: DC
  Administered 2022-01-02: 5 mg via INTRAVENOUS
  Filled 2022-01-02: qty 2

## 2022-01-02 MED ORDER — IBUPROFEN 100 MG/5ML PO SUSP
400.0000 mg | Freq: Once | ORAL | Status: AC
  Administered 2022-01-02: 400 mg
  Filled 2022-01-02: qty 20

## 2022-01-02 MED ORDER — PERAMPANEL 2 MG PO TABS
4.0000 mg | ORAL_TABLET | Freq: Every day | ORAL | Status: DC
  Administered 2022-01-02 – 2022-01-12 (×11): 4 mg
  Filled 2022-01-02 (×11): qty 2

## 2022-01-02 MED ORDER — DIAZEPAM 5 MG/ML IJ SOLN
7.5000 mg | Freq: Three times a day (TID) | INTRAMUSCULAR | Status: DC
  Administered 2022-01-02 – 2022-01-08 (×18): 7.5 mg via INTRAVENOUS
  Filled 2022-01-02 (×18): qty 2

## 2022-01-02 MED ORDER — DEXMEDETOMIDINE HCL IN NACL 400 MCG/100ML IV SOLN
0.2000 ug/kg/h | INTRAVENOUS | Status: AC
  Administered 2022-01-02 (×2): 0.4 ug/kg/h via INTRAVENOUS
  Administered 2022-01-02: 0.5 ug/kg/h via INTRAVENOUS
  Filled 2022-01-02 (×3): qty 100

## 2022-01-02 NOTE — Progress Notes (Signed)
Subjective: Has been having seizures without clinical signs, average 2 seizures per hour, lasting about 1.5 minutes each.  Son and brother at bedside. ? ?Per chart review, patient has been agitated overnight and was on Precedex.  Per RN, required 1 dose of fentanyl this morning. ? ?ROS: Unable to obtain due to poor mental status ? ?Examination ? ?Vital signs in last 24 hours: ?Temp:  [99.4 ?F (37.4 ?C)-100.6 ?F (38.1 ?C)] 100 ?F (37.8 ?C) (05/03 1129) ?Pulse Rate:  [65-92] 92 (05/03 1400) ?Resp:  [13-24] 16 (05/03 1400) ?BP: (91-128)/(51-80) 114/73 (05/03 1300) ?SpO2:  [96 %-100 %] 100 % (05/03 1400) ?FiO2 (%):  [40 %] 40 % (05/03 1200) ?Weight:  [110.7 kg] 110.7 kg (05/03 0500) ? ?General: lying in bed, NAD ?RS: Intubated ?Neuro: Opens eyes to repeated tactile stimulation, left gaze preference but is able to look to the right side, PERRLA, blinking spontaneously, did appear to wiggle toes on command but only on the left foot, did not close eyes on command, withdraws to noxious stimuli in all 4 extremities L>R ? ?Basic Metabolic Panel: ?Recent Labs  ?Lab 12/27/21 ?0500 12/28/21 ?0641 12/29/21 ?0151 12/29/21 ?0426 12/30/21 ?1941 12/31/21 ?7408 01/01/22 ?1448 01/02/22 ?0251  ?NA 145 144 148* 149* 148* 141 140 136  ?K 3.2* 2.8* 3.9 4.1 3.9 4.1 4.0 4.1  ?CL 114* 115* 122*  --  120* 112* 110 105  ?CO2 19* 22 22  --  23 23 24 25   ?GLUCOSE 188* 163* 190*  --  183* 168* 110* 121*  ?BUN 62* 38* 24*  --  18 15 14 13   ?CREATININE 2.57* 1.27* 1.00  --  0.79 0.71 0.61 0.59*  ?CALCIUM 8.1* 8.1* 8.0*  --  8.0* 8.0* 8.0* 7.9*  ?MG 2.5* 2.2 1.9  --  1.8 1.6*  --   --   ?PHOS 4.5 2.4* 1.6*  --  2.9 2.3*  --   --   ? ? ?CBC: ?Recent Labs  ?Lab 12/29/21 ?0151 12/29/21 ?0426 12/30/21 ?12/31/21 12/31/21 ?1856 01/01/22 ?3149 01/02/22 ?0251  ?WBC 12.6*  --  15.2* 13.7* 10.1 11.6*  ?HGB 12.0* 10.5* 10.5* 10.3* 9.5* 9.3*  ?HCT 36.4* 31.0* 34.3* 32.7* 29.8* 29.4*  ?MCV 96.8  --  100.0 99.4 98.0 97.0  ?PLT 141*  --  180 186 229 238   ? ? ? ?Coagulation Studies: ?No results for input(s): LABPROT, INR in the last 72 hours. ? ?Imaging ?No new brain imaging overnight ? ?ASSESSMENT AND PLAN:  61 year old male who was found down and noted to have seizures arising from the left hemisphere. ?  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  The frequency of epileptiform discharges goes over 3 Hz frequently which is most likely indicative of ictal nature of these discharges.  After around midnight on 01/02/2022, EEG showed seizures without clinical signs arising from left frontal region, average 2 seizures per hour, lasting about 1.5 minutes each. Additionally there is severe diffuse encephalopathy, likely due to medications, seizures. ?  ?Recommendations ?-We will start patient on IV Valium 7.5 mg 3 times daily.  This can be increased if patient has further seizures and/or continues to be agitated ?-Increase perampanel to 4 mg nightly ?-Continue Keppra 2000 mg twice daily, Vimpat 200 mg twice daily, phenobarbital 130 mg twice daily daily ?-Continue LTM EEG until patient is  seizure-free for about 24 hours ?-Management of rest of comorbidities per primary team ?-Discussed plan with critical care team, patient's brother and son at bedside ? ?CRITICAL CARE ?Performed by: Charlsie Quest ? ? ?Total critical care time: 32 minutes ? ?Critical care time was exclusive of separately billable procedures and treating other patients. ? ?Critical care was necessary to treat or prevent imminent or life-threatening deterioration. ? ?Critical care was time spent personally by me on the following activities: development of treatment plan with patient and/or surrogate as well as nursing, discussions with consultants, evaluation of  patient's response to treatment, examination of patient, obtaining history from patient or surrogate, ordering and performing treatments and interventions, ordering and review of laboratory studies, ordering and review of radiographic studies, pulse oximetry and re-evaluation of patient's condition. ? ?Lindie Spruce ?Epilepsy ?[facility] ?For questions after 5pm please refer to AMION to reach the Neurologist on call ? ?

## 2022-01-02 NOTE — Progress Notes (Signed)
Placed pt. Back on full support due to pt. Having increased RR and not breathing with vent properly. RN aware.  ?

## 2022-01-02 NOTE — Progress Notes (Signed)
Sputum sample obtained and sent to lab at this time. Verbal order received from Dr. Judeth Horn, pt may stay on cpap/ps all night if more comfortable/ issues with high peak pressures. RT will continue to monitor and be available as needed. ?

## 2022-01-02 NOTE — Procedures (Addendum)
Patient Name: Victor Erickson  ?MRN: [id number]  ?Epilepsy Attending: Charlsie Quest  ?Referring Physician/Provider: Charlsie Quest, MD ?Duration: 01/01/2022 1742 to 01/02/2022 1742 ?  ?Patient history: 61yo M alcoholic patient found down for 2 days, with labs concerning for STEMI, out of the window for STEMI activation, also presenting with full neurologic deficit concerning for stroke versus postictal Todd's paralysis.  ?  ?Level of alertness: comatose ?  ?AEDs during EEG study: LEV, LCM, phenobarbital, perampanel, valium ?  ?Technical aspects: This EEG study was done with scalp electrodes positioned according to the 10-20 International system of electrode placement. Electrical activity was acquired at a sampling rate of 500Hz  and reviewed with a high frequency filter of 70Hz  and a low frequency filter of 1Hz . EEG data were recorded continuously and digitally stored.  ?  ?Description: EEG initially showed lateralized periodic discharges in left hemisphere, maximal left frontal region, maximal F3 with fluctuating frequency of 2 to 4 Hz which at times appear rhythmic consistent with brief ictal-interictal rhythmic discharges.   ? ?After midnight, EEG showed seizures without clinical signs arising from left frontal region, average 2 seizures per hour, lasting about 1.5 minutes each.  During the seizure EEG initially showed 2.5 to 3 Hz spikes in left frontal region which gradually involved all of left hemisphere and then involve the right hemisphere as well as evolved in morphology to low amplitude sharply contoured 3 to 5 Hz theta-delta slowing. ? ?There is also continuous  generalized low amplitude 2-3hz  delta slowing admixed with 15-16hz  beta activity. Hyperventilation and photic stimulation were not performed.    ?  ?ABNORMALITY ?-Seizure without clinical signs,  left frontal region ?-Brief ictal-interictal rhythmic discharges, left hemisphere, maximal left frontal region ?- Lateralized periodic discharges, left  hemisphere, maximal left frontal region, maximal F3 ?- Continuous slow, generalized  ?  ?IMPRESSION: ?This study initially showed evidence of epileptogenicity in left hemisphere, maximal left frontal region which is on the ictal-interictal continuum.  The frequency of epileptiform discharges goes over 3 Hz frequently which is most likely indicative of ictal nature of these discharges.  After around midnight on 01/02/2022, EEG showed seizures without clinical signs arising from left frontal region, average 2 seizures per hour, lasting about 1.5 minutes each. Additionally there is severe diffuse encephalopathy, likely due to medications, seizures. ? ? ? ?Lindy Garczynski ?

## 2022-01-02 NOTE — Consult Note (Addendum)
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 Patient has C-Collar which is pushing up and putting pressure on the tube and on his ears.  ?Pressure Injury POA: No ?Measurement: 1 x 1 x 0.2 ?Wound bed: 100% red ?Drainage (amount, consistency, odor) Sanguineous on dressing ?Periwound: intact ?Dressing procedure/placement/frequency: ?Continue foam dressing cut to size over this wound. Small pieces of foam dressing to cushion the ears and prevent PI in this area. WOC will follow weekly for progress. ? ?Monitor the wound area(s) for worsening of condition such as: ?Signs/symptoms of infection, increase in size, development of or worsening of odor, ?development of pain, or increased pain at the affected locations.   ?Notify the medical team if any of these develop. ? ?Thank you for the consult. WOC nurse will not follow at this time.   ?Please re-consult the WOC team if needed. ? ?Renaldo Reel. Katrinka Blazing, MSN, RN, CMSRN, AGCNS, WTA ?Wound Treatment Associate ?Pager [phone]   ? ? ?  ?

## 2022-01-02 NOTE — Progress Notes (Signed)
? ?NAME:  Victor Erickson, MRN:  [id number], DOB:  1960-10-05, LOS: 8 ?ADMISSION DATE:  12/25/2021, CONSULTATION DATE:  12/25/2021 ?REFERRING MD: Dr. Armandina Gemma CHIEF COMPLAINT:  Found down  ? ?History of Present Illness:  ?61 year old man who presented to [facility] 4/25 after being found down. PMHx significant for HTN, HLD, seizures, EtOH abuse. Required intubation for airway protection.  Noted to be in status epilepticus, rhabdomyolysis. Family reported that he decided to quit drinking last week. ? ?PCCM consulted for ICU admission for status epilepticus/EtOH withdrawal. ? ?Pertinent Medical History:  ?Hypertension ?Hyperlipidemia ?Seizures in past ?Alcohol abuse ? ?Significant Hospital Events: ?Including procedures, antibiotic start and stop dates in addition to other pertinent events   ?4/25 admission, CTA head/neck NAICP, small area of encephalomalacial left ant frontal lobe likely from prior trauma, fluid throughout R scalp; CT C.AP> no hematoma, GGO RUL, infiltrates bilateral lower lobes, no RP hematoma, enlarged fatty liver; intubated, LTM started ?4/28 increased vimpat and phenobarb, continue keppra and wean propofol, continue versed infusion; stop zosyn, no seizures on EEG ?4/29 Fever, WBC increased ?4/30 Waking.  Ongoing seizures on EEG ?5/1 concern for ongoing seizure on EEG ?5/4 Continued subclinical seizures on EEG. Slightly improved neuro exam, not yet following commands. ? ?Interim History / Subjective:  ?Precedex initiated overnight for increased agitation ?Ongoing subclinical seizures on EEG ?Diazepam scheduled per Epilepsy team ?Weaning sedation as able for more accurate neuro assessment ?Improving neurologic exam, following some commands off of Precedex ? ?Objective:  ?Blood pressure (!) 101/55, pulse 68, temperature 100.2 ?F (37.9 ?C), temperature source Oral, resp. rate 16, height 6\' 1"  (1.854 m), weight 110.7 kg, SpO2 97 %. ?   ?Vent Mode: PRVC ?FiO2 (%):  [40 %] 40 % ?Set Rate:  [16 bmp] 16 bmp ?Vt Set:   [id number] mL] 630 mL ?PEEP:  [5 cmH20] 5 cmH20 ?Plateau Pressure:  [16 cmH20-19 cmH20] 19 cmH20  ? ?Intake/Output Summary (Last 24 hours) at 01/02/2022 0744 ?Last data filed at 01/02/2022 0700 ?Gross per 24 hour  ?Intake 5200.76 ml  ?Output 2750 ml  ?Net 2450.76 ml  ? ? ?Filed Weights  ? 12/31/21 0500 01/01/22 0425 01/02/22 0500  ?Weight: 106.3 kg 108.7 kg 110.7 kg  ? ?Physical Examination: ?General: Acutely ill-appearing middle-aged man in NAD. Sedated. ?HEENT: [place]/AT, anicteric sclera, PERRL, moist mucous membranes. ETT in place. ?Neuro: Sedated. Responds to noxious stimuli. and Withdraws to pain in all 4 extremities. Following commands intermittently. Moves all 4 extremities spontaneously. +Corneal, +Cough, and +Gag  ?CV: RRR, no m/g/r. ?PULM: Breathing even and unlabored on vent (PEEP 5, FiO2 40%). Lung fields CTAB anteriorly. ?GI: Soft, nontender, nondistended. Normoactive bowel sounds. ?Extremities: Bilateral symmetric 1+ LE edema noted. ?Skin: Warm/dry, no rashes. ? ?Resolved Hospital Problem List:  ?Aspiration Pneumonia ? ?Assessment & Plan:  ? ?Status Epilepticus  ?Requiring burst suppression > subclinical seizure 4/30 > ongoing through 5/2 ?- Neurology/Epilepsy team following, appreciate recommendations ?- Continue AEDS (Keppra, Vimpat, Fycompa) ?- Continue phenobarbital ?- Scheduled Diazepam ?- LTM EEG still with subclinical seizures ?- Burst suppression goal independent of RASS ? ?Found down, wearing hard collar per protocol ?No acute fracture on CT but has not been able to clear from soft tissue standpoint  ?- Clear neck/C-spine when clinically more awake/able to participate in exam ? ?History of Alcohol Abuse ?EtOH withdrawal with delirium tremens ?- Monitor for signs/symptoms of withdrawal ?- Precedex, wean as able ?- Phenobarbital as above ?- Continue thiamine, folate ?- MV when able to tolerate PO ? ?Acute Respiratory Failure  with Hypoxemia ?- Continue full vent support (4-8cc/kg IBW) ?- Wean FiO2 for O2 sat  > 90% ?- Daily WUA/SBT; mental status/ongoing seizures remain barriers to extubation ?- VAP bundle ?- DuoNebs TID ?- Pulmonary hygiene ?- Intermittent CXR, monitor RLL atelectasis ?- PAD protocol for sedation: Precedex for goal RASS -1 to -2 ? ?Rhabdomyolysis  ?Hypernatremia ?Urinary retention ?- Discontinue maintenance fluids ?- Trend BMP ?- FWF per tube PRN ?- Monitor I&Os ?- F/u urine studies ?- Avoid nephrotoxic agents as able ?- Ensure adequate renal perfusion ?- Bladder scans PRN ? ?EtOH Hepatitis ?- Trend LFTs ? ?Pressure Injures POA ?- WOC per protocol ? ?Best Practice (right click and "Reselect all SmartList Selections" daily)  ?Diet/type: tubefeeds ?DVT prophylaxis: prophylactic heparin  ?GI prophylaxis: PPI ?Lines: N/A ?Foley:  N/A ?Code Status:  full code ?Last date of multidisciplinary goals of care discussion: 5/2 wife updated via phone on plan of care.  Reviewed concerns regarding ongoing seizure activity.  Wife & son are planning to visit and we will have multidisciplinary meeting with family. Support offered.   ? ?Critical care time: 45 minutes  ? ?Lestine Mount, PA-C ?[facility] ?01/02/22 7:44 AM ? ?Please see [email or web address] for pager details. ? ?From 7A-7P if no response, please call [phone] ?After hours, please call ELink [phone] ?

## 2022-01-02 NOTE — Progress Notes (Signed)
EEG maintenance performed. No skin breakdown noted.  °

## 2022-01-02 NOTE — Progress Notes (Signed)
eLink Physician-Brief Progress Note ?Patient Name: Victor Erickson ?DOB: [birth year] ?MRN: [id number] ? ? ?Date of Service ? 01/02/2022  ?HPI/Events of Note ? Patient is very agitated and reaching for his ET tube, he is only getting PRN Fentanyl pushes for sedation on the ventilator.  ?eICU Interventions ? Low dose Precedex gtt added to optimize comfort and reduce agitation.  ? ? ? ?  ? ?Kerry Kass Kemauri Musa ?01/02/2022, 12:15 AM ?

## 2022-01-03 LAB — MAGNESIUM: Magnesium: 1.6 mg/dL — ABNORMAL LOW (ref 1.7–2.4)

## 2022-01-03 LAB — GLUCOSE, CAPILLARY
Glucose-Capillary: 103 mg/dL — ABNORMAL HIGH (ref 70–99)
Glucose-Capillary: 106 mg/dL — ABNORMAL HIGH (ref 70–99)
Glucose-Capillary: 117 mg/dL — ABNORMAL HIGH (ref 70–99)
Glucose-Capillary: 125 mg/dL — ABNORMAL HIGH (ref 70–99)
Glucose-Capillary: 126 mg/dL — ABNORMAL HIGH (ref 70–99)
Glucose-Capillary: 89 mg/dL (ref 70–99)

## 2022-01-03 LAB — CBC
HCT: 30.5 % — ABNORMAL LOW (ref 39.0–52.0)
Hemoglobin: 9.7 g/dL — ABNORMAL LOW (ref 13.0–17.0)
MCH: 30.7 pg (ref 26.0–34.0)
MCHC: 31.8 g/dL (ref 30.0–36.0)
MCV: 96.5 fL (ref 80.0–100.0)
Platelets: 253 10*3/uL (ref 150–400)
RBC: 3.16 MIL/uL — ABNORMAL LOW (ref 4.22–5.81)
RDW: 13.9 % (ref 11.5–15.5)
WBC: 16.2 10*3/uL — ABNORMAL HIGH (ref 4.0–10.5)
nRBC: 0 % (ref 0.0–0.2)

## 2022-01-03 LAB — BASIC METABOLIC PANEL
Anion gap: 7 (ref 5–15)
BUN: 12 mg/dL (ref 6–20)
CO2: 26 mmol/L (ref 22–32)
Calcium: 8.1 mg/dL — ABNORMAL LOW (ref 8.9–10.3)
Chloride: 104 mmol/L (ref 98–111)
Creatinine, Ser: 0.59 mg/dL — ABNORMAL LOW (ref 0.61–1.24)
GFR, Estimated: >60 mL/min (ref >60)
Glucose, Bld: 140 mg/dL — ABNORMAL HIGH (ref 70–99)
Potassium: 4.7 mmol/L (ref 3.5–5.1)
Sodium: 137 mmol/L (ref 135–145)

## 2022-01-03 LAB — PHOSPHORUS: Phosphorus: 3.4 mg/dL (ref 2.5–4.6)

## 2022-01-03 MED ORDER — ALTEPLASE 2 MG IJ SOLR: 2.0000 mg | Freq: Once | INTRAMUSCULAR | Status: DC

## 2022-01-03 MED ORDER — MAGNESIUM SULFATE 4 GM/100ML IV SOLN
4.0000 g | Freq: Once | INTRAVENOUS | Status: AC
  Administered 2022-01-03: 4 g via INTRAVENOUS
  Filled 2022-01-03: qty 100

## 2022-01-03 MED ORDER — LINEZOLID 600 MG PO TABS
600.0000 mg | ORAL_TABLET | Freq: Two times a day (BID) | ORAL | Status: DC
  Administered 2022-01-03 – 2022-01-04 (×3): 600 mg
  Filled 2022-01-03 (×3): qty 1

## 2022-01-03 MED ORDER — SODIUM CHLORIDE 0.9 % IV SOLN
2.0000 g | Freq: Three times a day (TID) | INTRAVENOUS | Status: DC
  Administered 2022-01-03 – 2022-01-04 (×4): 2 g via INTRAVENOUS
  Filled 2022-01-03 (×4): qty 12.5

## 2022-01-03 NOTE — TOC Progression Note (Signed)
Transition of Care (TOC) - Initial/Assessment Note  ? ? ?Patient Details  ?Name: Victor Erickson ?MRN: [id number] ?Date of Birth: 1961/03/13 ? ?Transition of Care (TOC) CM/SW Contact:    ?Paulene Floor Lalana Wachter, LCSWA ?Phone Number: ?01/03/2022, 10:59 AM ? ?Clinical Narrative:                 ?Still on vent.  Continued subclinical seizures on EEG. Slightly improved neuro exam, not yet following commands. ? ?Transition of Care Department [facility]) has reviewed patient and no TOC needs have been identified at this time. We will continue to monitor patient advancement through interdisciplinary progression rounds. If new patient transition needs arise, please place a TOC consult. ?  ? ?  ?  ? ? ?Patient Goals and CMS Choice ?  ?  ?  ? ?Expected Discharge Plan and Services ?  ?  ?  ?  ?  ?                ?  ?  ?  ?  ?  ?  ?  ?  ?  ?  ? ?Prior Living Arrangements/Services ?  ?  ?  ?       ?  ?  ?  ?  ? ?Activities of Daily Living ?  ?  ? ?Permission Sought/Granted ?  ?  ?   ?   ?   ?   ? ?Emotional Assessment ?  ?  ?  ?  ?  ?  ? ?Admission diagnosis:  Septic shock ([place]) [A41.9, R65.21] ?Patient Active Problem List  ? Diagnosis Date Noted  ? Malnutrition of moderate degree 12/27/2021  ? 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 Acute respiratory failure with hypoxia ([place]) 12/25/2021  ? Myocardial injury 12/25/2021  ? AKI (acute kidney injury) ([place]) 12/25/2021  ? Steatohepatitis, alcoholic 0000000  ? Pressure injury of skin 12/25/2021  ? ?PCP:  [facility] [place]:   ?[facility] ?[address] ?[place] [address] ?Phone: [phone] Fax: [phone] ? ? ? ? ?Social Determinants of Health (SDOH) Interventions ?  ? ?Readmission Risk Interventions ?   ? View : No data to display.  ?  ?  ?  ? ? ? ?

## 2022-01-03 NOTE — Progress Notes (Addendum)
? ?NAME:  Victor Erickson, MRN:  [id number], DOB:  [birth year], LOS: 9 ?ADMISSION DATE:  12/25/2021, CONSULTATION DATE:  12/25/2021 ?REFERRING MD: Dr. Karene Fry CHIEF COMPLAINT:  Found down  ? ?History of Present Illness:  ?61 year old man who presented to [facility] 4/25 after being found down. PMHx significant for HTN, HLD, seizures, EtOH abuse. Required intubation for airway protection.  Noted to be in status epilepticus, rhabdomyolysis. Family reported that he decided to quit drinking last week. ? ?PCCM consulted for ICU admission for status epilepticus/EtOH withdrawal. ? ?Pertinent Medical History:  ?Hypertension ?Hyperlipidemia ?Seizures in past ?Alcohol abuse ? ?Significant Hospital Events: ?Including procedures, antibiotic start and stop dates in addition to other pertinent events   ?4/25 admission, CTA head/neck NAICP, small area of encephalomalacial left ant frontal lobe likely from prior trauma, fluid throughout R scalp; CT C.AP> no hematoma, GGO RUL, infiltrates bilateral lower lobes, no RP hematoma, enlarged fatty liver; intubated, LTM started ?4/28 increased vimpat and phenobarb, continue keppra and wean propofol, continue versed infusion; stop zosyn, no seizures on EEG ?4/29 Fever, WBC increased ?4/30 Waking.  Ongoing seizures on EEG ?5/1 concern for ongoing seizure on EEG ?5/3 Continued subclinical seizures on EEG. Slightly improved neuro exam, not yet following commands. ?5/4 Slight improvement in neuro exam. Awake/calm, intermittently following commands. Tolerating PSV better than PRVC. LTM EEG, last seizure 2243. Febrile to 100.23F, broad spectrum abx started (cefepime, linezolid), Resp Cx with gram neg diplococci on prelim. ? ?Interim History / Subjective:  ?No significant events overnight ?Tolerated PSV, briefly back to [facility] overnight ?Continued LTM EEG, last seizure 2243 ?Neuro status slightly improved, intermittent command following ?Febrile overnight to 100.23F, broad spectrum abx started ?Resp Cx growing  gram neg diplococci ? ?Objective:  ?Blood pressure 98/63, pulse 65, temperature 99.3 ?F (37.4 ?C), temperature source Oral, resp. rate 16, height 6\' 1"  (1.854 m), weight 110.7 kg, SpO2 100 %. ?   ?Vent Mode: PSV ?FiO2 (%):  [40 %] 40 % ?Set Rate:  [16 bmp] 16 bmp ?Vt Set:  mL] 630 mL ?PEEP:  [5 cmH20] 5 cmH20 ?Pressure Support:  [14 cmH20] 14 cmH20 ?Plateau Pressure:  [16 cmH20-25 cmH20] 18 cmH20  ? ?Intake/Output Summary (Last 24 hours) at 01/03/2022 0752 ?Last data filed at 01/03/2022 0600 ?Gross per 24 hour  ?Intake 1956.19 ml  ?Output 2475 ml  ?Net -518.81 ml  ? ? ?Filed Weights  ? 12/31/21 0500 01/01/22 0425 01/02/22 0500  ?Weight: 106.3 kg 108.7 kg 110.7 kg  ? ?Physical Examination: ?General: Acutely ill-appearing middle-aged man in NAD. ?HEENT: [place]/AT, anicteric sclera, PERRL, moist mucous membranes. Upper lip swelling. ETT in place. ?Neuro:  Awake, unable to assess orientation.  Responds to verbal stimuli. Following commands intermittently. Moves all 4 extremities spontaneously. +Cough and +Gag  ?CV: RRR, no m/g/r. ?PULM: Breathing even and unlabored on vent (PSV 14/5, FiO2 40%). Lung fields with coarse rhonchi in upper fields. ?GI: Soft, nontender, nondistended. Normoactive bowel sounds. ?Extremities: Bilateral symmetric 1+ nonpitting LE edema noted. ?Skin: Warm/dry, no rashes. ? ?Resolved Hospital Problem List:  ?Aspiration Pneumonia ? ?Assessment & Plan:  ? ?Status Epilepticus  ?Requiring burst suppression > subclinical seizure 4/30 > ongoing through 5/2 ?- Neuro/Epilepsy team following, appreciate recommendations ?- No seizures since 5/3PM 2243 ?- Continue AEDs ?- Continue phenobarbital ?- Diazepam scheduled ?- LTM EEG ? ?Found down, wearing hard collar per protocol ?No acute fracture on CT but has not been able to clear from soft tissue standpoint  ?- Clear neck/C-spine when clinically able to participate  in exam ? ?History of Alcohol Abuse ?EtOH withdrawal  ?- Monitor for signs/symptoms of  withdrawal ?- Precedex weaned off ?- Phenobarbital as above ?- Continue thiamine, folate ?- MV when able to tolerate PO ? ?Acute Respiratory Failure with Hypoxemia ?- Continue full vent support (4-8cc/kg IBW) ?- Tolerating PSV during daytime ?- Wean FiO2 for O2 sat > 90% ?- Daily WUA/SBT; mental status remains barrier to extubation ?- VAP bundle ?- DuoNebs TID ?- Pulmonary hygiene ?- Intermittent CXR ? ?Aspiration pneumonia vs. HCAP ?- Resp Cx with gram negative diplococci ?- Continue broad-spectrum antibiotics ?- Cefepime, linezolid ?- Follow Cx data and narrow abx as able ? ?Rhabdomyolysis  ?Hypernatremia ?Urinary retention ?- Discontinue maintenance fluid ?- Trend BMP ?- FWF per tube PRN ?- Monitor I&Os ?- F/u urine studies ?- Avoid nephrotoxic agents as able ?- Ensure adequate renal perfusion ?- Bladder scans PRN ? ?EtOH Hepatitis ?- Trend LFTs ? ?Pressure Injures POA ?- WOC per protocol ? ?Best Practice (right click and "Reselect all SmartList Selections" daily)  ?Diet/type: tubefeeds ?DVT prophylaxis: prophylactic heparin  ?GI prophylaxis: PPI ?Lines: N/A ?Foley:  N/A ?Code Status:  full code ?Last date of multidisciplinary goals of care discussion: 5/3 Patient's brother, sister updated at bedside.   ? ?Critical care time: 38 minutes  ? ?Tim Lair, PA-C ?[facility] ?01/03/22 7:52 AM ? ?Please see [email or web address] for pager details. ? ?From 7A-7P if no response, please call [phone] ?After hours, please call ELink [phone] ?

## 2022-01-03 NOTE — Progress Notes (Signed)
Subjective: No further seizure after 2243 on 01/02/2022. Brother at bedside. ? ?ROS: Unable to obtain due to poor mental status ? ?Examination ? ?Vital signs in last 24 hours: ?Temp:  [98.4 ?F (36.9 ?C)-101 ?F (38.3 ?C)] 98.4 ?F (36.9 ?C) (05/04 1146) ?Pulse Rate:  [65-118] 77 (05/04 1200) ?Resp:  [15-26] 19 (05/04 1200) ?BP: (78-164)/(52-82) 101/58 (05/04 1200) ?SpO2:  [95 %-100 %] 100 % (05/04 1200) ?FiO2 (%):  [40 %] 40 % (05/04 1106) ? ?General: lying in bed, NAD ?RS: Intubated ?Neuro: Opens eyes to verbal stimulation, follows simple commands like showing his thumb and wiggling toes, PERRLA, EOMI, blinking spontaneously, withdraws to noxious stimuli in all 4 extremities L>R ? ?Basic Metabolic Panel: ?Recent Labs  ?Lab 12/28/21 ?0641 12/29/21 ?0151 12/29/21 ?0426 12/30/21 ?0814 12/31/21 ?4818 01/01/22 ?5631 01/02/22 ?4970 01/03/22 ?0135  ?NA 144 148*   < > 148* 141 140 136 137  ?K 2.8* 3.9   < > 3.9 4.1 4.0 4.1 4.7  ?CL 115* 122*  --  120* 112* 110 105 104  ?CO2 22 22  --  23 23 24 25 26   ?GLUCOSE 163* 190*  --  183* 168* 110* 121* 140*  ?BUN 38* 24*  --  18 15 14 13 12   ?CREATININE 1.27* 1.00  --  0.79 0.71 0.61 0.59* 0.59*  ?CALCIUM 8.1* 8.0*  --  8.0* 8.0* 8.0* 7.9* 8.1*  ?MG 2.2 1.9  --  1.8 1.6*  --   --  1.6*  ?PHOS 2.4* 1.6*  --  2.9 2.3*  --   --  3.4  ? < > = values in this interval not displayed.  ? ? ?CBC: ?Recent Labs  ?Lab 12/30/21 ? 12/31/21 ?2637 01/01/22 ?8588 01/02/22 ?0251 01/03/22 ?0830  ?WBC 15.2* 13.7* 10.1 11.6* 16.2*  ?HGB 10.5* 10.3* 9.5* 9.3* 9.7*  ?HCT 34.3* 32.7* 29.8* 29.4* 30.5*  ?MCV 100.0 99.4 98.0 97.0 96.5  ?PLT 180 186 229 238 253  ? ? ? ?Coagulation Studies: ?No results for input(s): LABPROT, INR in the last 72 hours. ? ?Imaging ?No new brain imaging overnight ?  ?ASSESSMENT AND PLAN:  61 year old male who was found down and noted to have seizures arising from the left hemisphere. ?  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 Last seizure was noted on 12/03/2021 at 2243. EEG then showed evidence of epileptogenicity in left hemisphere, maximal left frontal region which is on the ictal-interictal continuum. Additionally there is severe diffuse encephalopathy, likely due to medications, seizures. EEG appears to be improving compared to previous day. ?  ?Recommendations ?-Continue IV Valium 7.5 mg 3 times daily, perampanel to 4 mg nightly, Keppra 2000 mg twice daily, Vimpat 200 mg twice daily, phenobarbital 130 mg twice daily daily ?-Continue LTM EEG until patient is seizure-free for about 24 hours ?- If patient remains sz free and continues to improve, will start tapering valium or phenobarb on monday ?-Management of rest of comorbidities per primary team ?-Discussed plan with critical care team, patient's brother at bedside ? ?I have spent a total of   37  minutes with the patient reviewing hospital notes,  test results, labs and examining the patient as well as establishing an assessment and plan.   > 50% of time was spent in direct patient care. ?  ?Byrdie Miyazaki  Melynda Ripple ?Epilepsy ?[facility] ?For questions after 5pm please refer to AMION to reach the Neurologist on call ? ?

## 2022-01-03 NOTE — Procedures (Addendum)
Patient Name: Victor Erickson  ?MRN: [id number]  ?Epilepsy Attending: Lora Havens  ?Referring Physician/Provider: Lora Havens, MD ?Duration: 01/02/2022 1742 to 01/03/2022 1742 ?  ?Patient history: [id number] M alcoholic patient found down for 2 days, with labs concerning for STEMI, out of the window for STEMI activation, also presenting with full neurologic deficit concerning for stroke versus postictal Todd's paralysis.  ?  ?Level of alertness: lethargic ?  ?AEDs during EEG study: LEV, LCM, phenobarbital, perampanel, valium ?  ?Technical aspects: This EEG study was done with scalp electrodes positioned according to the 10-20 International system of electrode placement. Electrical activity was acquired at a sampling rate of 500Hz  and reviewed with a high frequency filter of 70Hz  and a low frequency filter of 1Hz . EEG data were recorded continuously and digitally stored.  ?  ?Description:  EEG initially showed seizures without clinical signs arising from left frontal region, average 2 seizures per hour, lasting about 1 minute each.  During the seizure EEG initially showed 2.5 to 3 Hz spikes in left frontal region which gradually involved all of left hemisphere and then involve the right hemisphere as well as evolved in morphology to low amplitude sharply contoured 3 to 5 Hz theta-delta slowing. Last seizure was noted on 12/03/2021 at 2243. ? ?EEG also showed lateralized periodic discharges in left hemisphere, maximal left frontal region, maximal F3 with fluctuating frequency of 2 to 3 Hz which at times appear rhythmic consistent with brief ictal-interictal rhythmic discharges.  There is also continuous  generalized low amplitude 2-3hz  delta slowing admixed with 15-16hz  beta activity. Hyperventilation and photic stimulation were not performed.    ?  ?ABNORMALITY ?-Seizure without clinical signs,  left frontal region ?-Brief ictal-interictal rhythmic discharges, left hemisphere, maximal left frontal region ?-  Lateralized periodic discharges, left hemisphere, maximal left frontal region, maximal F3 ?- Continuous slow, generalized  ?  ?IMPRESSION: ?This study initially showed seizures without clinical signs arising from left frontal region, average 2 seizures per hour, lasting about 1 minute each. Last seizure was noted on 12/03/2021 at 2243. ? ?EEG then showed evidence of epileptogenicity in left hemisphere, maximal left frontal region which is on the ictal-interictal continuum. Additionally there is severe diffuse encephalopathy, likely due to medications, seizures. ? ?EEG appears to be improving compared to previous day. ? ?Lora Havens  ?

## 2022-01-04 LAB — BASIC METABOLIC PANEL
Anion gap: 7 (ref 5–15)
BUN: 13 mg/dL (ref 6–20)
CO2: 25 mmol/L (ref 22–32)
Calcium: 7.8 mg/dL — ABNORMAL LOW (ref 8.9–10.3)
Chloride: 104 mmol/L (ref 98–111)
Creatinine, Ser: 0.55 mg/dL — ABNORMAL LOW (ref 0.61–1.24)
GFR, Estimated: >60 mL/min (ref >60)
Glucose, Bld: 115 mg/dL — ABNORMAL HIGH (ref 70–99)
Potassium: 4 mmol/L (ref 3.5–5.1)
Sodium: 136 mmol/L (ref 135–145)

## 2022-01-04 LAB — CBC
HCT: 28.4 % — ABNORMAL LOW (ref 39.0–52.0)
Hemoglobin: 9.2 g/dL — ABNORMAL LOW (ref 13.0–17.0)
MCH: 31.2 pg (ref 26.0–34.0)
MCHC: 32.4 g/dL (ref 30.0–36.0)
MCV: 96.3 fL (ref 80.0–100.0)
Platelets: 277 10*3/uL (ref 150–400)
RBC: 2.95 MIL/uL — ABNORMAL LOW (ref 4.22–5.81)
RDW: 14.1 % (ref 11.5–15.5)
WBC: 15.2 10*3/uL — ABNORMAL HIGH (ref 4.0–10.5)
nRBC: 0 % (ref 0.0–0.2)

## 2022-01-04 LAB — GLUCOSE, CAPILLARY
Glucose-Capillary: 103 mg/dL — ABNORMAL HIGH (ref 70–99)
Glucose-Capillary: 103 mg/dL — ABNORMAL HIGH (ref 70–99)
Glucose-Capillary: 123 mg/dL — ABNORMAL HIGH (ref 70–99)
Glucose-Capillary: 104 mg/dL — ABNORMAL HIGH (ref 70–99)
Glucose-Capillary: 101 mg/dL — ABNORMAL HIGH (ref 70–99)
Glucose-Capillary: 95 mg/dL (ref 70–99)

## 2022-01-04 LAB — PHENOBARBITAL LEVEL: Phenobarbital: 11.3 ug/mL — ABNORMAL LOW (ref 15.0–40.0)

## 2022-01-04 LAB — MAGNESIUM: Magnesium: 1.8 mg/dL (ref 1.7–2.4)

## 2022-01-04 MED ORDER — POLYETHYLENE GLYCOL 3350 17 G PO PACK: 17.0000 g | PACK | Freq: Every day | ORAL | Status: DC | PRN

## 2022-01-04 MED ORDER — DOCUSATE SODIUM 50 MG/5ML PO LIQD
100.0000 mg | Freq: Two times a day (BID) | ORAL | Status: DC | PRN
  Administered 2022-01-09: 100 mg

## 2022-01-04 MED ORDER — SODIUM CHLORIDE 3 % IN NEBU
4.0000 mL | INHALATION_SOLUTION | Freq: Two times a day (BID) | RESPIRATORY_TRACT | Status: DC
  Administered 2022-01-04 – 2022-01-08 (×9): 4 mL via RESPIRATORY_TRACT
  Filled 2022-01-04 (×10): qty 4

## 2022-01-04 MED ORDER — MAGNESIUM SULFATE 2 GM/50ML IV SOLN
2.0000 g | Freq: Once | INTRAVENOUS | Status: AC
  Administered 2022-01-04: 2 g via INTRAVENOUS
  Filled 2022-01-04: qty 50

## 2022-01-04 MED ORDER — SODIUM CHLORIDE 3 % IN NEBU: 4.0000 mL | INHALATION_SOLUTION | Freq: Every day | RESPIRATORY_TRACT | Status: DC

## 2022-01-04 MED ORDER — SODIUM CHLORIDE 0.9 % IV SOLN
2.0000 g | INTRAVENOUS | Status: AC
  Administered 2022-01-04 – 2022-01-07 (×4): 2 g via INTRAVENOUS
  Filled 2022-01-04 (×4): qty 20

## 2022-01-04 MED ORDER — LIP MEDEX EX OINT
TOPICAL_OINTMENT | CUTANEOUS | Status: DC | PRN
  Administered 2022-01-04 – 2022-01-05 (×2): 1 via TOPICAL
  Administered 2022-01-08: 75 via TOPICAL
  Filled 2022-01-04: qty 7

## 2022-01-04 NOTE — Progress Notes (Signed)
? ?NAME:  Victor Erickson, MRN:  [id number], DOB:  Jan 27, 1961, LOS: 10 ?ADMISSION DATE:  12/25/2021, CONSULTATION DATE:  12/25/2021 ?REFERRING MD: Dr. Karene Fry CHIEF COMPLAINT:  Found down  ? ?History of Present Illness:  ?61 year old man who presented to [facility] 4/25 after being found down. PMHx significant for HTN, HLD, seizures, EtOH abuse. Required intubation for airway protection.  Noted to be in status epilepticus, rhabdomyolysis. Family reported that he decided to quit drinking last week. ? ?PCCM consulted for ICU admission for status epilepticus/EtOH withdrawal. ? ?Pertinent Medical History:  ?HTN, HLD, seizures, EtOH abuse ? ?Significant Hospital Events: ?Including procedures, antibiotic start and stop dates in addition to other pertinent events   ?4/25 admission, CTA head/neck NAICP, small area of encephalomalacial left ant frontal lobe likely from prior trauma, fluid throughout R scalp; CT C.AP> no hematoma, GGO RUL, infiltrates bilateral lower lobes, no RP hematoma, enlarged fatty liver; intubated, LTM started ?4/28 increased vimpat and phenobarb, continue keppra and wean propofol, continue versed infusion; stop zosyn, no seizures on EEG ?4/29 Fever, WBC increased ?4/30 Waking.  Ongoing seizures on EEG ?5/1 concern for ongoing seizure on EEG ?5/3 Continued subclinical seizures on EEG. Slightly improved neuro exam, not yet following commands. ?5/4 Slight improvement in neuro exam. Awake/calm, intermittently following commands. Tolerating PSV better than PRVC. LTM EEG, last seizure 2243. Febrile to 100.22F, broad spectrum abx started (cefepime, linezolid), Resp Cx with gram neg diplococci on prelim. ?5/5 Continued improvement in neuro exam, following commands more consistently. Off sedation. No further fevers. LTM EEG in place. Tolerating PSV 12/5. C-collar removed. ? ?Interim History / Subjective:  ?No significant events overnight ?Improved neuro exam, following commands more consistently ?No further seizures on  LTM EEG ?Off sedation with exception of scheduled Valium ?C-collar to be removed today ?Tolerating PSV 12/5 ?Ongoing thick secretions potential barrier to extubation ? ?Objective:  ?Blood pressure 112/60, pulse 73, temperature 98.9 ?F (37.2 ?C), temperature source Oral, resp. rate 16, height 6\' 1"  (1.854 m), weight 110.7 kg, SpO2 100 %. ?   ?Vent Mode: PSV ?FiO2 (%):  [40 %] 40 % ?Set Rate:  [16 bmp] 16 bmp ?Vt Set:  mL] 630 mL ?PEEP:  [5 cmH20] 5 cmH20 ?Pressure Support:  [12 cmH20-14 cmH20] 12 cmH20 ?Plateau Pressure:  [17 cmH20-20 cmH20] 20 cmH20  ? ?Intake/Output Summary (Last 24 hours) at 01/04/2022 0727 ?Last data filed at 01/04/2022 0600 ?Gross per 24 hour  ?Intake 2820.03 ml  ?Output 2700 ml  ?Net 120.03 ml  ? ? ?Filed Weights  ? 12/31/21 0500 01/01/22 0425 01/02/22 0500  ?Weight: 106.3 kg 108.7 kg 110.7 kg  ? ?Physical Examination: ?General: Acutely ill-appearing middle-aged man in NAD. Mildly sedated. ?HEENT: [place]/AT, anicteric sclera, PERRL, moist mucous membranes. Upper lip swelling. ETT in place. ?Neuro:  Lightly sedated with valium, wakes easily to voice.  Responds to verbal stimuli. Following commands consistently. Moves all 4 extremities spontaneously. +Cough and +Gag  ?CV: RRR, no m/g/r. ?PULM: Breathing even and unlabored on vent (PSV 12/5). Lung fields with coarse rhonchi, improved from prior exam. ?GI: Soft, nontender, nondistended. Normoactive bowel sounds. ?Extremities: Trace symmetric BLE edema noted. ?Skin: Warm/dry, no rashes. ? ?Resolved Hospital Problem List:  ?Aspiration Pneumonia ?Hypernatremia ? ?Assessment & Plan:  ? ?Status Epilepticus  ?Requiring burst suppression > subclinical seizure 4/30 > ongoing through 5/2 ?- Neuro/Epilepsy team following, appreciate recommendations ?- No further seizures since 5/3PM 2243 ?- Continue AEDs ?- Continue phenobarbital ?- Scheduled diazepam ?- LTM EEG per Epilepsy ? ?Found down,  wearing hard collar per protocol ?No acute fracture on CT but has not  been able to clear from soft tissue standpoint  ?- Discontinue C-collar today, 5/5 ? ?History of Alcohol Abuse ?EtOH withdrawal  ?- Monitor for signs/symptoms of withdrawal ?- Precedex, wean as able ?- Phenobarbital, as above ?- Continue thiamine, folate ?- MV when able to tolerate PO ? ?Acute Respiratory Failure with Hypoxemia ?- Continue full vent support (4-8cc/kg IBW) ?- Tolerating PSV 12/5 ?- Wean FiO2 for O2 sat > 90% ?- Daily WUA/SBT; mental status/secretions remain barriers to extubation - will consider addition of hypertonic saline to loosen/thin secretions ?- VAP bundle ?- DuoNebs TID ?- Pulmonary hygiene ?- Intermittent CXR ? ?Aspiration pneumonia vs. HCAP ?Resp Cx with gram negative diplococci. ?- Continue broad-spectrum antibiotics ?- Cefepime, linezolid ?- Follow Cx data, narrow abx as able ? ?Rhabdomyolysis  ?Urinary retention ?- Trend BMP ?- Replete electrolytes as indicated ?- Monitor I&Os ?- F/u urine studies ?- Avoid nephrotoxic agents as able ?- Ensure adequate renal perfusion ?- Bladder scans PRN ? ?EtOH Hepatitis ?- Trend LFTs ? ?Pressure Injures POA ?- WOC per protocol, appreciate recs ? ?Best Practice (right click and "Reselect all SmartList Selections" daily)  ?Diet/type: tubefeeds ?DVT prophylaxis: prophylactic heparin  ?GI prophylaxis: PPI ?Lines: N/A ?Foley:  N/A ?Code Status:  full code ?Last date of multidisciplinary goals of care discussion: 5/3 Patient's brother, sister updated at bedside.   ? ?Critical care time: 35 minutes  ? ?Tim Lair, PA-C ?[facility] ?01/04/22 7:27 AM ? ?Please see [email or web address] for pager details. ? ?From 7A-7P if no response, please call [phone] ?After hours, please call ELink [phone] ?

## 2022-01-04 NOTE — Procedures (Addendum)
Patient Name: Victor Erickson  ?MRN: [id number]  ?Epilepsy Attending: Charlsie Quest  ?Referring Physician/Provider: Charlsie Quest, MD ?Duration: 01/03/2022 1742 to 01/04/2022 1742 ?  ?Patient history: 61yo M alcoholic patient found down for 2 days, with labs concerning for STEMI, out of the window for STEMI activation, also presenting with full neurologic deficit concerning for stroke versus postictal Todd's paralysis.  ?  ?Level of alertness: lethargic ?  ?AEDs during EEG study: LEV, LCM, phenobarbital, perampanel, valium ?  ?Technical aspects: This EEG study was done with scalp electrodes positioned according to the 10-20 International system of electrode placement. Electrical activity was acquired at a sampling rate of 500Hz  and reviewed with a high frequency filter of 70Hz  and a low frequency filter of 1Hz . EEG data were recorded continuously and digitally stored.  ?  ?Description:  EEG showed lateralized periodic discharges in left hemisphere, maximal left frontal region, maximal F3 with fluctuating frequency of 2 to 3 Hz which at times appear rhythmic consistent with brief ictal-interictal rhythmic discharges. There is also continuous  generalized low amplitude 3-6hz  theta-delta slowing admixed with 15-16hz  beta activity. Hyperventilation and photic stimulation were not performed.    ?  ?ABNORMALITY ?-Brief ictal-interictal rhythmic discharges, left hemisphere, maximal left frontal region ?- Lateralized periodic discharges, left hemisphere, maximal left frontal region, maximal F3 ?- Continuous slow, generalized  ?  ?IMPRESSION: ?This study showed evidence of epileptogenicity in left hemisphere, maximal left frontal region which is on the ictal-interictal continuum with increased risk of seizure recurrence. Additionally there is moderate to severe diffuse encephalopathy, likely due to medications, seizures. ?  ?  ?

## 2022-01-04 NOTE — Progress Notes (Signed)
eLink Physician-Brief Progress Note ?Patient Name: Victor Erickson ?DOB: 1960-10-20 ?MRN: [id number] ? ? ?Date of Service ? 01/04/2022  ?HPI/Events of Note ? Agitation - Nursing request to renew L wrist restraint.   ?eICU Interventions ? Will renew soft L wrist restraint X 9 hours.   ? ? ? ?Intervention Category ?Major Interventions: Delirium, psychosis, severe agitation - evaluation and management ? ?[name] [name] ?01/04/2022, 12:43 AM ?

## 2022-01-04 NOTE — Progress Notes (Signed)
Subjective: No definite seizure ? ?ROS: Unable to obtain due to poor mental status ? ?Examination ? ?Vital signs in last 24 hours: ?Temp:  [98.5 ?F (36.9 ?C)-100.3 ?F (37.9 ?C)] 98.7 ?F (37.1 ?C) (05/05 1214) ?Pulse Rate:  [73-93] 73 (05/05 0600) ?Resp:  [15-22] 16 (05/05 0600) ?BP: (92-128)/(52-75) 112/60 (05/05 0600) ?SpO2:  [99 %-100 %] 100 % (05/05 0600) ?FiO2 (%):  [40 %] 40 % (05/05 1112) ? ?General: lying in bed, NAD ?RS: Intubated ?Neuro: Opens eyes to repeated tactile stimulation, didn't follows commands  PERRLA, no gaze deviation, blinks to threat BL, withdraws to noxious stimuli in all 4 extremities L>R ? ?Basic Metabolic Panel: ?Recent Labs  ?Lab 12/29/21 ?0151 12/29/21 ?0426 12/30/21 ?5329 12/31/21 ?9242 01/01/22 ?6834 01/02/22 ?1962 01/03/22 ?2297 01/04/22 ?9892  ?NA 148*   < > 148* 141 140 136 137 136  ?K 3.9   < > 3.9 4.1 4.0 4.1 4.7 4.0  ?CL 122*  --  120* 112* 110 105 104 104  ?CO2 22  --  23 23 24 25 26 25   ?GLUCOSE 190*  --  183* 168* 110* 121* 140* 115*  ?BUN 24*  --  18 15 14 13 12 13   ?CREATININE 1.00  --  0.79 0.71 0.61 0.59* 0.59* 0.55*  ?CALCIUM 8.0*  --  8.0* 8.0* 8.0* 7.9* 8.1* 7.8*  ?MG 1.9  --  1.8 1.6*  --   --  1.6* 1.8  ?PHOS 1.6*  --  2.9 2.3*  --   --  3.4  --   ? < > = values in this interval not displayed.  ? ? ?CBC: ?Recent Labs  ?Lab 12/31/21 ?0156 01/01/22 ?0209 01/02/22 ?0251 01/03/22 ?0830 01/04/22 ?0752  ?WBC 13.7* 10.1 11.6* 16.2* 15.2*  ?HGB 10.3* 9.5* 9.3* 9.7* 9.2*  ?HCT 32.7* 29.8* 29.4* 30.5* 28.4*  ?MCV 99.4 98.0 97.0 96.5 96.3  ?PLT 186 229 238 253 277  ? ? ? ?Coagulation Studies: ?No results for input(s): LABPROT, INR in the last 72 hours. ? ?Imaging ?No new brain imaging overnight ?  ?ASSESSMENT AND PLAN:  61 year old male who was found down and noted to have seizures arising from the left hemisphere. ?  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 Additionally there is moderate to severe diffuse encephalopathy, likely due to medications, seizures. ?  ?Recommendations ?-Continue IV Valium 7.5 mg 3 times daily, perampanel to 4 mg nightly, Keppra 2000 mg twice daily, Vimpat 200 mg twice daily, phenobarbital 130 mg twice daily daily ?- Will check phenobarb level. Will attempt to wean phenobarb or valium to minimize sedation if patient remains sz free  ?-Continue LTM EEG until patient is more awake consistently  ?-Management of rest of comorbidities per primary team ?-Discussed plan with critical care team, patient's brother at bedside ?  ?I have spent a total of 36  minutes with the patient reviewing hospital notes,  test results, labs and examining the patient as well as establishing an assessment and plan.   > 50% of time was spent in direct patient care. ? ? ?03/06/22 ?Epilepsy ?[facility] ?For questions after 5pm please refer to AMION to reach the Neurologist on call ? ?

## 2022-01-05 ENCOUNTER — Inpatient Hospital Stay (HOSPITAL_COMMUNITY): Payer: BC Managed Care – PPO

## 2022-01-05 DIAGNOSIS — J9601 Acute respiratory failure with hypoxia: Secondary | ICD-10-CM

## 2022-01-05 LAB — CULTURE, RESPIRATORY W GRAM STAIN: Culture: NORMAL

## 2022-01-05 LAB — MAGNESIUM: Magnesium: 1.7 mg/dL (ref 1.7–2.4)

## 2022-01-05 LAB — GLUCOSE, CAPILLARY
Glucose-Capillary: 109 mg/dL — ABNORMAL HIGH (ref 70–99)
Glucose-Capillary: 103 mg/dL — ABNORMAL HIGH (ref 70–99)
Glucose-Capillary: 130 mg/dL — ABNORMAL HIGH (ref 70–99)
Glucose-Capillary: 112 mg/dL — ABNORMAL HIGH (ref 70–99)
Glucose-Capillary: 110 mg/dL — ABNORMAL HIGH (ref 70–99)

## 2022-01-05 LAB — BASIC METABOLIC PANEL
Anion gap: 7 (ref 5–15)
BUN: 12 mg/dL (ref 6–20)
CO2: 25 mmol/L (ref 22–32)
Calcium: 8.1 mg/dL — ABNORMAL LOW (ref 8.9–10.3)
Chloride: 105 mmol/L (ref 98–111)
Creatinine, Ser: 0.53 mg/dL — ABNORMAL LOW (ref 0.61–1.24)
GFR, Estimated: >60 mL/min (ref >60)
Glucose, Bld: 109 mg/dL — ABNORMAL HIGH (ref 70–99)
Potassium: 4.2 mmol/L (ref 3.5–5.1)
Sodium: 137 mmol/L (ref 135–145)

## 2022-01-05 LAB — CBC
HCT: 29 % — ABNORMAL LOW (ref 39.0–52.0)
Hemoglobin: 9.2 g/dL — ABNORMAL LOW (ref 13.0–17.0)
MCH: 30.4 pg (ref 26.0–34.0)
MCHC: 31.7 g/dL (ref 30.0–36.0)
MCV: 95.7 fL (ref 80.0–100.0)
Platelets: 308 10*3/uL (ref 150–400)
RBC: 3.03 MIL/uL — ABNORMAL LOW (ref 4.22–5.81)
RDW: 14.1 % (ref 11.5–15.5)
WBC: 12.1 10*3/uL — ABNORMAL HIGH (ref 4.0–10.5)
nRBC: 0 % (ref 0.0–0.2)

## 2022-01-05 LAB — PHOSPHORUS: Phosphorus: 2.8 mg/dL (ref 2.5–4.6)

## 2022-01-05 IMAGING — CT CT HEAD W/O CM
4 series · 16 of 47 positions shown, 18 images · non-contrast
Comparison: [DATE]

CLINICAL DATA: Altered mental status.  Recent seizures.



[Series 3: head wo · axial · 0.36mm/px · z∈[-222,-91]mm · 7 of 37 slices shown, 9 images]
[im 5/37  brain]
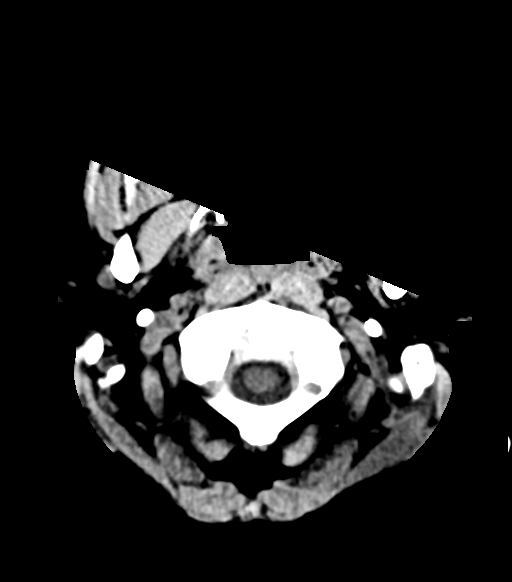
[im 5/37  bone]
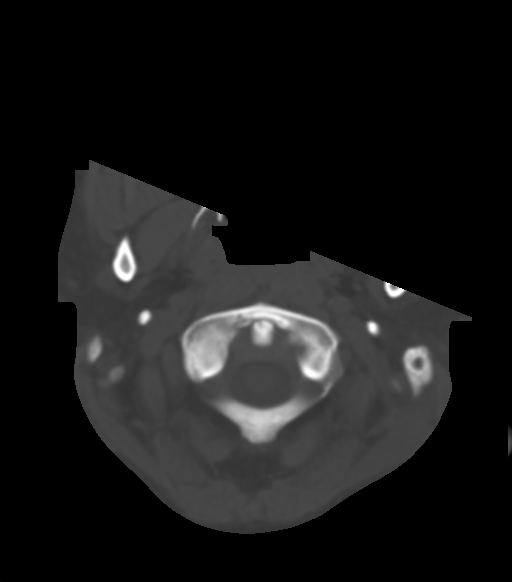
[im 10/37  brain]
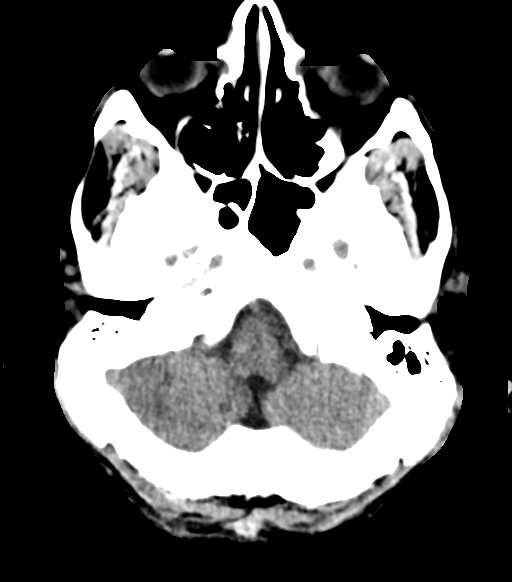
[im 14/37  brain]
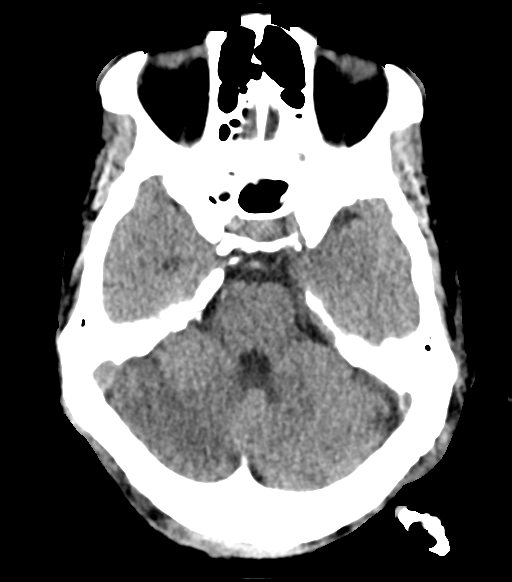
[im 19/37  brain]
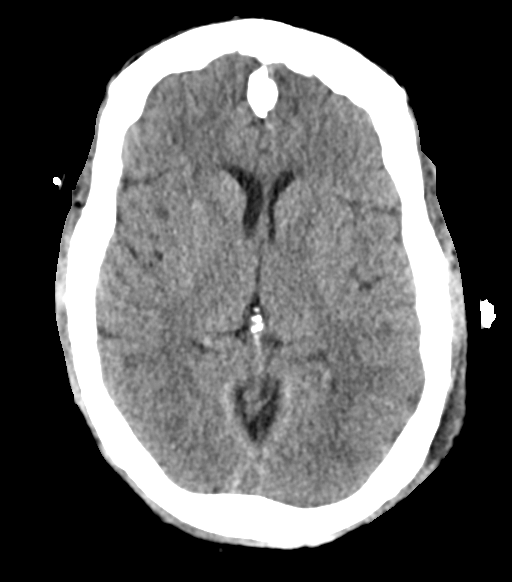
[im 23/37  brain]
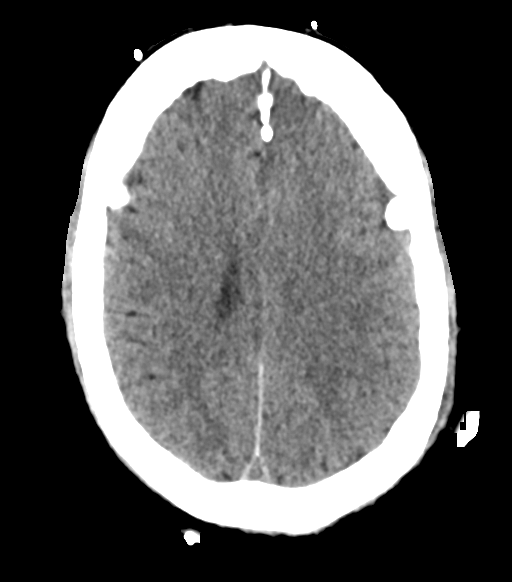
[im 23/37  bone]
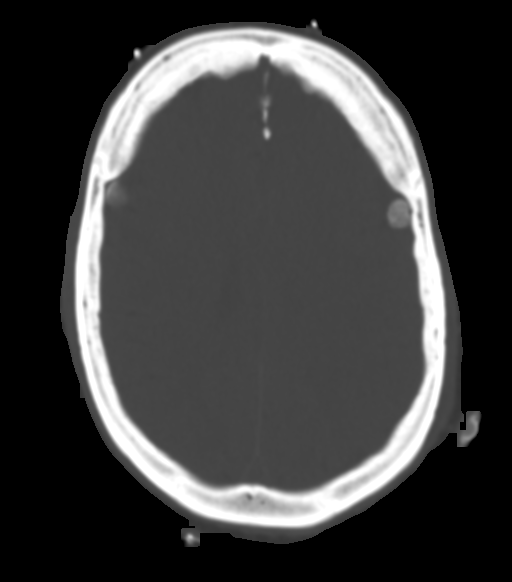
[im 28/37  brain]
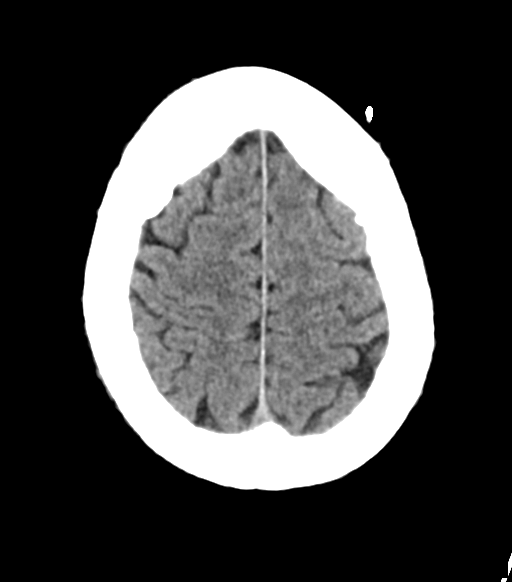
[im 32/37  brain]
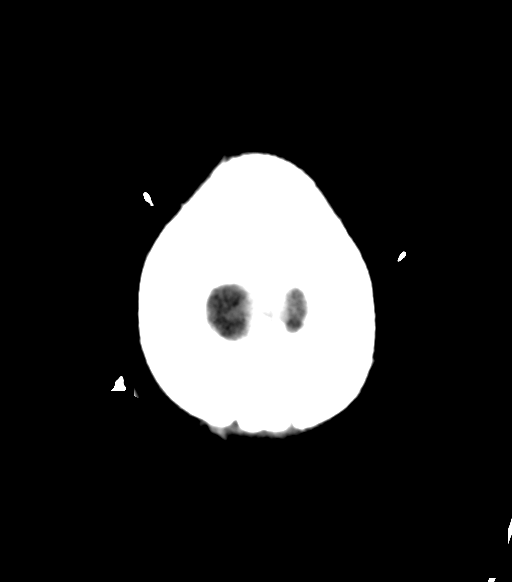

[Series 4: head bone · axial · 0.44mm/px · z∈[-193,-157]mm · 3 of 88 slices shown]
[im 9/88  bone]
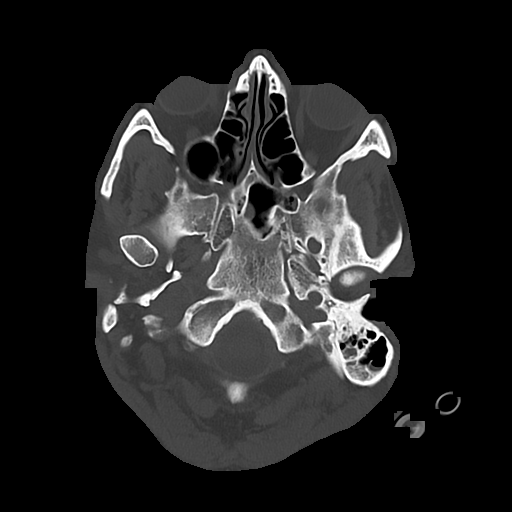
[im 18/88  bone]
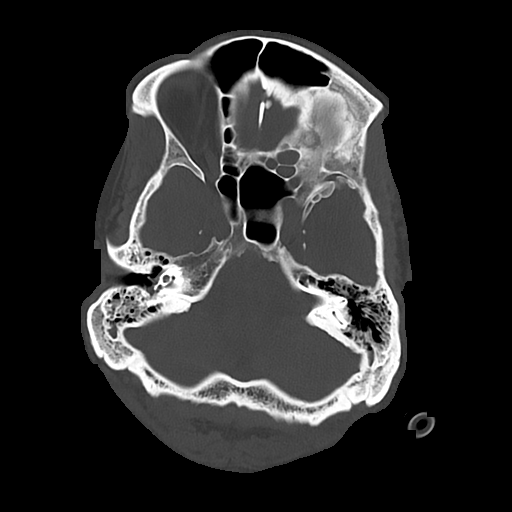
[im 27/88  bone]
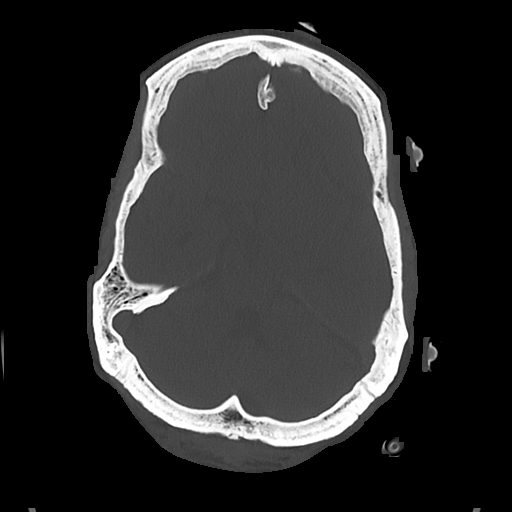

[Series 5: cor soft · coronal · 0.36mm/px · 3 of 70 slices shown]
[im 24/70  brain]
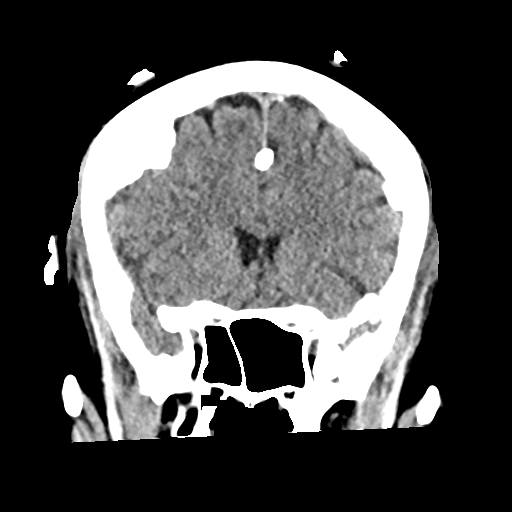
[im 31/70  brain]
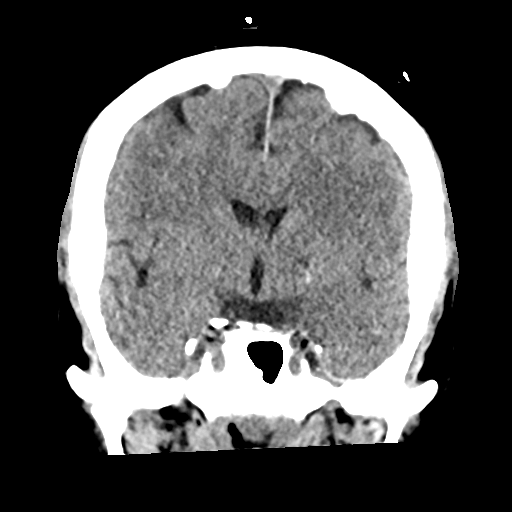
[im 39/70  brain]
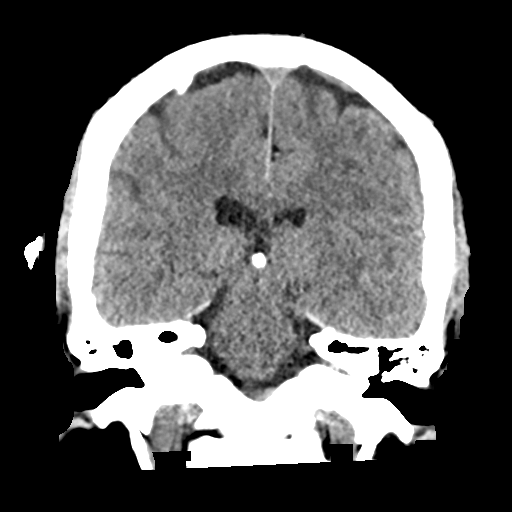

[Series 6: sag soft · sagittal · 0.36mm/px · 3 of 61 slices shown]
[im 21/61  brain]
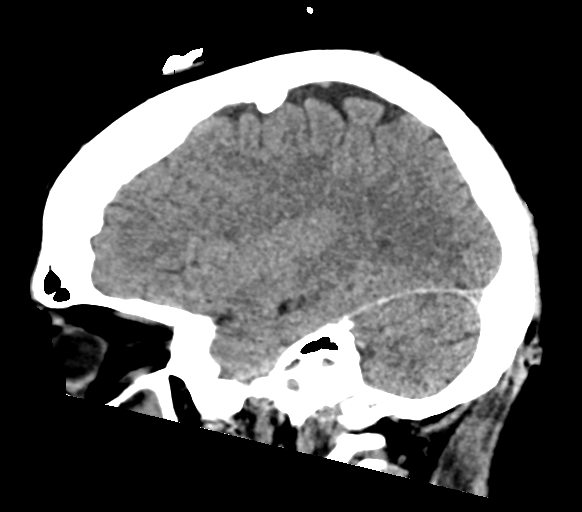
[im 31/61  brain]
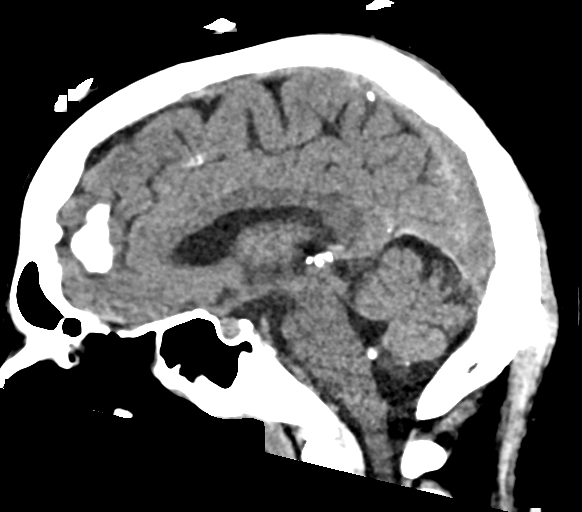
[im 41/61  brain]
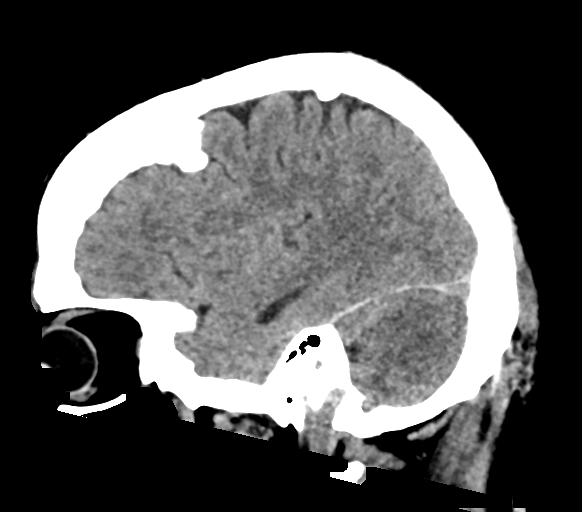

[16 of 47 positions shown; findings below may reference images not displayed]

FINDINGS: Brain: No evidence of intracranial hemorrhage, hydrocephalus,
extra-axial collection, or mass effect. Increased edema is seen
involving the right cerebellar hemisphere. Chronic encephalomalacia
is again seen in the medial left frontal lobe.

Vascular:  No hyperdense vessel or other acute findings.

Skull: No evidence of fracture or other significant bone
abnormality.

Sinuses/Orbits: No acute findings. Mucosal thickening again seen
involving the left maxillary and bilateral ethmoid sinuses.

Other: Stable bulky bilateral dural calcifications.
IMPRESSION: Increased edema involving the right cerebellar hemisphere. No
significant mass effect or herniation.

Stable chronic encephalomalacia in medial left frontal lobe.

## 2022-01-05 MED ORDER — FUROSEMIDE 10 MG/ML IJ SOLN
40.0000 mg | Freq: Once | INTRAMUSCULAR | Status: AC
  Administered 2022-01-05: 40 mg via INTRAVENOUS
  Filled 2022-01-05: qty 4

## 2022-01-05 MED ORDER — MAGNESIUM SULFATE 2 GM/50ML IV SOLN
2.0000 g | Freq: Once | INTRAVENOUS | Status: AC
  Administered 2022-01-05: 2 g via INTRAVENOUS
  Filled 2022-01-05: qty 50

## 2022-01-05 MED ORDER — PHENOBARBITAL SODIUM 130 MG/ML IJ SOLN
97.5000 mg | Freq: Two times a day (BID) | INTRAMUSCULAR | Status: DC
  Administered 2022-01-05 – 2022-01-11 (×12): 97.5 mg via INTRAVENOUS
  Filled 2022-01-05 (×12): qty 1

## 2022-01-05 NOTE — Progress Notes (Signed)
RT note. ?Patient flipped to SBT 12/5 40%. Vt- 494, RR 14. ?Sat 100% and resting comfortable.  ?Attempted 10/5 & 5/5, patient unable to do at this time due to low vt.  ?RT will continue to monitor.  ?

## 2022-01-05 NOTE — Progress Notes (Signed)
eLink Physician-Brief Progress Note ?Patient Name: Victor Erickson ?DOB: 11/11/1960 ?MRN: [id number] ? ? ?Date of Service ? 01/05/2022  ?HPI/Events of Note ? Agitation - Nursing request to renew L wrist restraint.  ?eICU Interventions ? Will renew L wrist restraint X 12 hours.   ? ? ? ?Intervention Category ?Major Interventions: Delirium, psychosis, severe agitation - evaluation and management ? ?Willaim Mode [name] ?01/05/2022, 9:36 PM ?

## 2022-01-05 NOTE — Progress Notes (Signed)
LTM maint complete - no skin breakdown under:  Fp1 Fp2 F4 ?Serviced several leads ?[facility] monitored, Event button test confirmed by [facility]. ? ?

## 2022-01-05 NOTE — Progress Notes (Signed)
RT note. ?Patient transported to CT and back without any complications, patient placed back on SBT. ?

## 2022-01-05 NOTE — Procedures (Addendum)
Patient Name: Victor Erickson  ?MRN: [id number]  ?Epilepsy Attending: Charlsie Quest  ?Referring Physician/Provider: Charlsie Quest, MD ?Duration: 01/04/2022 1742 to 01/05/2022 1742 ?  ?Patient history: 61yo M alcoholic patient found down for 2 days, with labs concerning for STEMI, out of the window for STEMI activation, also presenting with full neurologic deficit concerning for stroke versus postictal Todd's paralysis.  ?  ?Level of alertness: lethargic, aasleep ?  ?AEDs during EEG study: LEV, LCM, phenobarbital, perampanel, valium ?  ?Technical aspects: This EEG study was done with scalp electrodes positioned according to the 10-20 International system of electrode placement. Electrical activity was acquired at a sampling rate of 500Hz  and reviewed with a high frequency filter of 70Hz  and a low frequency filter of 1Hz . EEG data were recorded continuously and digitally stored.  ?  ?Description:  No posterior dominant rhythm was seen. Sleep was characterized by sleep spindles (12-14hz ), maximal frontocentral region. EEG showed lateralized periodic discharges in left hemisphere, maximal left frontal region, maximal F3 at 1 to 2hz . There is also continuous  generalized low amplitude 3-6hz  theta-delta slowing admixed with 15-16hz  beta activity. Hyperventilation and photic stimulation were not performed.    ?  ?ABNORMALITY ?- Lateralized periodic discharges, left hemisphere, maximal left frontal region, maximal F3 ?- Continuous slow, generalized  ?  ?IMPRESSION: ?This study showed evidence of epileptogenicity in left hemisphere, maximal left frontal region which is on the ictal-interictal continuum with increased risk of seizure recurrence. Additionally there is moderate to severe diffuse encephalopathy, likely due to medications.  ?  ?  ?

## 2022-01-05 NOTE — Plan of Care (Signed)
?  Problem: Clinical Measurements: ?Goal: Respiratory complications will improve ?Outcome: Progressing ?  ?Problem: Safety: ?Goal: Non-violent Restraint(s) ?Outcome: Not Progressing ?  ?Problem: Education: ?Goal: Knowledge of General Education information will improve ?Description: Including pain rating scale, medication(s)/side effects and non-pharmacologic comfort measures ?Outcome: Not Progressing ?  ?Problem: Health Behavior/Discharge Planning: ?Goal: Ability to manage health-related needs will improve ?Outcome: Not Progressing ?  ?Problem: Clinical Measurements: ?Goal: Ability to maintain clinical measurements within normal limits will improve ?Outcome: Not Progressing ?Goal: Will remain free from infection ?Outcome: Not Progressing ?Goal: Diagnostic test results will improve ?Outcome: Not Progressing ?Goal: Cardiovascular complication will be avoided ?Outcome: Not Progressing ?  ?Problem: Activity: ?Goal: Risk for activity intolerance will decrease ?Outcome: Not Progressing ?  ?Problem: Nutrition: ?Goal: Adequate nutrition will be maintained ?Outcome: Not Progressing ?  ?Problem: Coping: ?Goal: Level of anxiety will decrease ?Outcome: Not Progressing ?  ?Problem: Elimination: ?Goal: Will not experience complications related to bowel motility ?Outcome: Not Progressing ?Goal: Will not experience complications related to urinary retention ?Outcome: Not Progressing ?  ?Problem: Pain Managment: ?Goal: General experience of comfort will improve ?Outcome: Not Progressing ?  ?Problem: Safety: ?Goal: Ability to remain free from injury will improve ?Outcome: Not Progressing ?  ?Problem: Skin Integrity: ?Goal: Risk for impaired skin integrity will decrease ?Outcome: Not Progressing ?  ?

## 2022-01-05 NOTE — Progress Notes (Signed)
Mg 1.7 Replaced per protocol  

## 2022-01-05 NOTE — Progress Notes (Signed)
? ?NAME:  Victor Erickson, MRN:  [id number], DOB:  Feb 13, 1961, LOS: 11 ?ADMISSION DATE:  12/25/2021, CONSULTATION DATE:  12/25/2021 ?REFERRING MD: Dr. Karene Fry CHIEF COMPLAINT:  Found down  ? ?History of Present Illness:  ?61 year old man who presented to [facility] 4/25 after being found down. PMHx significant for HTN, HLD, seizures, EtOH abuse. Required intubation for airway protection.  Noted to be in status epilepticus, rhabdomyolysis. Family reported that he decided to quit drinking last week. ? ?PCCM consulted for ICU admission for status epilepticus/EtOH withdrawal. ? ?Pertinent Medical History:  ?HTN, HLD, seizures, EtOH abuse ? ?Significant Hospital Events: ?Including procedures, antibiotic start and stop dates in addition to other pertinent events   ?4/25 admission, CTA head/neck NAICP, small area of encephalomalacial left ant frontal lobe likely from prior trauma, fluid throughout R scalp; CT C.AP> no hematoma, GGO RUL, infiltrates bilateral lower lobes, no RP hematoma, enlarged fatty liver; intubated, LTM started ?4/28 increased vimpat and phenobarb, continue keppra and wean propofol, continue versed infusion; stop zosyn, no seizures on EEG ?4/29 Fever, WBC increased ?4/30 Waking.  Ongoing seizures on EEG ?5/1 concern for ongoing seizure on EEG ?5/3 Continued subclinical seizures on EEG. Slightly improved neuro exam, not yet following commands. ?5/4 Slight improvement in neuro exam. Awake/calm, intermittently following commands. Tolerating PSV better than PRVC. LTM EEG, last seizure 2243. Febrile to 100.99F, broad spectrum abx started (cefepime, linezolid), Resp Cx with gram neg diplococci on prelim. ?5/5 Continued improvement in neuro exam, following commands more consistently. Off sedation. No further fevers. LTM EEG in place. Tolerating PSV 12/5. C-collar removed. ?5/6 no major issues overnight, remains on ventilator this AM.  Currently tolerating pressure support but requiring 12 over PEEP ? ?Interim History /  Subjective:  ?Will track to verbal command, right side flaccid ? ?Objective:  ?Blood pressure 109/68, pulse 65, temperature 98.4 ?F (36.9 ?C), temperature source Oral, resp. rate 16, height 6\' 1"  (1.854 m), weight 110.7 kg, SpO2 100 %. ?   ?Vent Mode: PRVC ?FiO2 (%):  [40 %] 40 % ?Set Rate:  [16 bmp] 16 bmp ?Vt Set:  mL] 630 mL ?PEEP:  [5 cmH20] 5 cmH20 ?Pressure Support:  [12 cmH20] 12 cmH20 ?Plateau Pressure:  [18 cmH20-20 cmH20] 19 cmH20  ? ?Intake/Output Summary (Last 24 hours) at 01/05/2022 0700 ?Last data filed at 01/05/2022 0500 ?Gross per 24 hour  ?Intake 1701.67 ml  ?Output 2525 ml  ?Net -823.33 ml  ? ? ?Filed Weights  ? 12/31/21 0500 01/01/22 0425 01/02/22 0500  ?Weight: 106.3 kg 108.7 kg 110.7 kg  ? ?Physical Examination: ?General: Acute on chronic ill-appearing elderly gentleman lying in bed on mechanical ventilation in no acute distress ?HEENT: ETT, MM pink/moist, PERRL,  ?Neuro: Will open eyes and track movement in room to verbal command, able to follow commands on left side, right upper and lower extremity flaccid ?CV: s1s2 regular rate and rhythm, no murmur, rubs, or gallops,  ?PULM: Currently receiving chest percussion via bed limiting auscultation of lungs, no gross rhonchi identified.  Tolerating ventilator ?GI: soft, bowel sounds active in all 4 quadrants, non-tender, non-distended, tolerating TF ?Extremities: warm/dry, no edema  ?Skin: no rashes or lesions ? ?Resolved Hospital Problem List:  ?Aspiration Pneumonia ?Hypernatremia ?Found down, wearing hard collar per protocol ?-No acute fracture on CT but has not been able to clear from soft tissue standpoint.  C-collar removed 5/5 ? ?Assessment & Plan:  ? ?Status Epilepticus  ?-Requiring burst suppression > subclinical seizure 4/30 > ongoing through 5/2 ?Flaccid right side ?-  AM assessment 5/6 reveals 0/5 strength in right upper and lower extremity.  Bedside RN reports this is patient's baseline since admission ?P: ?Primary management per  neurology ?Maintain neuro protective measures; goal for eurothermia, euglycemia, eunatermia, normoxia, and PCO2 goal of 35-40 ?Nutrition and bowel regiment  ?Seizure precautions  ?AEDs per neurology  ?Aspirations precautions  ?Continuous EEG per neurology ?Continue Vimpat, phenobarbital, and Keppra ?Discussed with neurology need for repeat imaging last head CT 5/1 ? ?History of Alcohol Abuse ?EtOH withdrawal  ?EtOH hepatitis ?P: ?Continue to monitor for signs of withdrawal ?Continue Precedex and phenobarbital ?Supplement thiamine, folate, and multivitamin ?Trend LFTs ?Avoid hepatotoxins ? ?Acute Respiratory Failure with Hypoxemia ?P: ?Continue ventilator support with lung protective strategies  ?Wean PEEP and FiO2 for sats greater than 90%. ?Head of bed elevated 30 degrees. ?Plateau pressures less than 30 cm H20.  ?Follow intermittent chest x-ray and ABG.   ?SAT/SBT as tolerated, mentation preclude extubation  ?Ensure adequate pulmonary hygiene  ?Follow cultures  ?VAP bundle in place  ?PAD protocol ?Currently tolerating SBT but requiring 12 of pressure over PEEP ? ?Aspiration pneumonia vs. HCAP ?-Resp Cx with gram negative diplococci. ?P: ?Continue empiric ceftriaxone  ?Supportive of care ?Aspiration precautions ? ?Rhabdomyolysis  ?Urinary retention ?P: ?CK total is downtrending ?Closely monitor urine output ?Avoid nephrotoxins ?Ensure renal perfusion ?Continue external catheter with as needed bladder scans ? ?Pressure Injures POA ?P: ?Pressure alleviating devices ?WOC nurse following ?Optimize nutrition ? ?Marked volume overload ?-Intake and output review reveals patient is currently 27 L positive for admission ?P: ?Gentle diuresing to start today ?Continue to follow strict intake and output ? ?Best Practice (right click and "Reselect all SmartList Selections" daily)  ?Diet/type: tubefeeds ?DVT prophylaxis: prophylactic heparin  ?GI prophylaxis: PPI ?Lines: N/A ?Foley:  N/A ?Code Status:  full code ?Last date of  multidisciplinary goals of care discussion: 5/3 Patient's brother, sister updated at bedside.   ? ?Critical care time:   ? ?Performed by: Sumeya Yontz D. Harris ? ? ?Total critical care time: 38 minutes ? ?Critical care time was exclusive of separately billable procedures and treating other patients. ? ?Critical care was necessary to treat or prevent imminent or life-threatening deterioration. ? ?Critical care was time spent personally by me on the following activities: development of treatment plan with patient and/or surrogate as well as nursing, discussions with consultants, evaluation of patient's response to treatment, examination of patient, obtaining history from patient or surrogate, ordering and performing treatments and interventions, ordering and review of laboratory studies, ordering and review of radiographic studies, pulse oximetry and re-evaluation of patient's condition. ? ?Jaymison Luber D. Harris, NP-C ?[facility] ?Personal contact information can be found on Amion  ?01/05/2022, 7:05 AM ? ? ?

## 2022-01-05 NOTE — Progress Notes (Addendum)
Subjective: ?No seizures overnight.  ? ?Exam: ?Vitals:  ? 01/05/22 1525 01/05/22 1600  ?BP:  97/60  ?Pulse:  74  ?Resp:  (!) 22  ?Temp: 98.2 ?F (36.8 ?C)   ?SpO2:  100%  ? ?Gen: In bed, NAD ?Resp: non-labored breathing, no acute distress ?Abd: soft, nt ? ?Neuro: ?MS: awakens to voice, follows command son the left.  ?[id number] seeing fingers wiggle in both hemifields.  Right pupil is reactive, he has an irregular left pupil due to previous injury ?Motor: With deep nailbed pressure, he does flex his right arm, follows commands in the left ?Sensory: Response to noxious stimulation on the left, only to deep nailbed pressure on the right ? ?Pertinent Labs: ?PHB 11.3 from yesterday ? ?Impression: 61 yo M who presented on 04/25 after being found down. He has been in refractory statsu epilepticus that is improving. Now with PLEDs but improving exam.  ? ?Recommendations: ?1) Will reduce phenobarbital to 97.5 twice daily ?2) continue Valium 7.5 3 times daily ?3) continue Vimpat 200 twice daily ?4) continue Keppra 2 g twice daily ?5) continue perampanel 4 mg bedtime ?6) we will continue to follow ? ?Ritta Slot, MD ?[facility] ?[phone] ? ?If 7pm- 7am, please page neurology on call as listed in AMION. ? ?

## 2022-01-06 LAB — GLUCOSE, CAPILLARY
Glucose-Capillary: 102 mg/dL — ABNORMAL HIGH (ref 70–99)
Glucose-Capillary: 104 mg/dL — ABNORMAL HIGH (ref 70–99)
Glucose-Capillary: 105 mg/dL — ABNORMAL HIGH (ref 70–99)
Glucose-Capillary: 110 mg/dL — ABNORMAL HIGH (ref 70–99)
Glucose-Capillary: 125 mg/dL — ABNORMAL HIGH (ref 70–99)
Glucose-Capillary: 98 mg/dL (ref 70–99)
Glucose-Capillary: 99 mg/dL (ref 70–99)

## 2022-01-06 LAB — BASIC METABOLIC PANEL
Anion gap: 7 (ref 5–15)
BUN: 13 mg/dL (ref 6–20)
CO2: 28 mmol/L (ref 22–32)
Calcium: 8.3 mg/dL — ABNORMAL LOW (ref 8.9–10.3)
Chloride: 103 mmol/L (ref 98–111)
Creatinine, Ser: 0.54 mg/dL — ABNORMAL LOW (ref 0.61–1.24)
GFR, Estimated: >60 mL/min (ref >60)
Glucose, Bld: 103 mg/dL — ABNORMAL HIGH (ref 70–99)
Potassium: 4.6 mmol/L (ref 3.5–5.1)
Sodium: 138 mmol/L (ref 135–145)

## 2022-01-06 MED ORDER — FUROSEMIDE 10 MG/ML IJ SOLN
40.0000 mg | INTRAMUSCULAR | Status: AC
  Administered 2022-01-06 (×2): 40 mg via INTRAVENOUS
  Filled 2022-01-06 (×2): qty 4

## 2022-01-06 NOTE — Progress Notes (Signed)
LTM EEG discontinued - no skin breakdown at unhook.   

## 2022-01-06 NOTE — Procedures (Addendum)
Patient Name: Victor Erickson  ?MRN: [id number]  ?Epilepsy Attending: Lora Havens  ?Referring Physician/Provider: Lora Havens, MD ?Duration: 01/05/2022 1742 to 01/06/2022 1353 ?  ?Patient history: [id number] M alcoholic patient found down for 2 days, with labs concerning for STEMI, out of the window for STEMI activation, also presenting with full neurologic deficit concerning for stroke versus postictal Todd's paralysis.  ?  ?Level of alertness: lethargic, aasleep ?  ?AEDs during EEG study: LEV, LCM, phenobarbital, perampanel, valium ?  ?Technical aspects: This EEG study was done with scalp electrodes positioned according to the 10-20 International system of electrode placement. Electrical activity was acquired at a sampling rate of 500Hz  and reviewed with a high frequency filter of 70Hz  and a low frequency filter of 1Hz . EEG data were recorded continuously and digitally stored.  ?  ?Description:  No posterior dominant rhythm was seen. Sleep was characterized by sleep spindles (12-14hz ), maximal frontocentral region. EEG showed lateralized periodic discharges in left hemisphere, maximal left frontal region, maximal F3 with fluctuation frequency of 0.25-0.5hz  when asleep and 1.5-2Hz   when awake/stimulated. There is also continuous  generalized low amplitude 3-6hz  theta-delta slowing. Hyperventilation and photic stimulation were not performed.    ?  ?ABNORMALITY ?- Lateralized periodic discharges, left hemisphere, maximal left frontal region, maximal F3 ?- Continuous slow, generalized  ?  ?IMPRESSION: ?This study showed evidence of epileptogenicity in left hemisphere, maximal left frontal region which is on the ictal-interictal continuum with increased risk of seizure recurrence. Additionally there is moderate diffuse encephalopathy, likely due to medications.  ? ?EEG appears to be improving compared to previous day. ?  ?Lora Havens  ?

## 2022-01-06 NOTE — Plan of Care (Signed)
?  Problem: Safety: ?Goal: Non-violent Restraint(s) ?Outcome: Progressing ?  ?Problem: Elimination: ?Goal: Will not experience complications related to bowel motility ?Outcome: Progressing ?Goal: Will not experience complications related to urinary retention ?Outcome: Progressing ?  ?

## 2022-01-06 NOTE — Progress Notes (Signed)
Subjective: ?No seizures overnight.  ? ?Exam: ?Vitals:  ? 01/06/22 1100 01/06/22 1123  ?BP: (!) 90/55   ?Pulse: 84 82  ?Resp: (!) 21 (!) 23  ?Temp:    ?SpO2: 98% 98%  ? ?Gen: In bed, NAD ?Resp: non-labored breathing, no acute distress ?Abd: soft, nt ? ?Neuro: ?MS: awakens to voice, follows command son the left.  ?[id number] seeing fingers wiggle in both hemifields.  Right pupil is reactive, he has an irregular left pupil due to previous injury ?Motor: With deep nailbed pressure, he does flex his right arm, follows commands in the left ?Sensory: Response to noxious stimulation on the left, only to deep nailbed pressure on the right ? ?Pertinent Labs: ?Cr 0.54 ? ?Impression: 61 yo M who presented on 04/25 after being found down. He has been in refractory statsu epilepticus that is improving. Now with PLEDs but improving exam.  ? ?Recommendations: ?1) continue phenobarbital 97.5 twice daily ?2) continue Valium 7.5 3 times daily ?3) continue Vimpat 200 twice daily ?4) continue Keppra 2 g twice daily ?5) continue perampanel 4 mg bedtime ?6) can d/c EEG ?7) will follow ? ?Ritta Slot, MD ?[facility] ?[phone] ? ?If 7pm- 7am, please page neurology on call as listed in AMION. ? ?

## 2022-01-06 NOTE — Plan of Care (Signed)
?  Problem: Clinical Measurements: ?Goal: Respiratory complications will improve ?Outcome: Progressing ?Goal: Cardiovascular complication will be avoided ?Outcome: Progressing ?  ?Problem: Nutrition: ?Goal: Adequate nutrition will be maintained ?Outcome: Progressing ?  ?Problem: Safety: ?Goal: Non-violent Restraint(s) ?Outcome: Not Progressing ?  ?Problem: Education: ?Goal: Knowledge of General Education information will improve ?Description: Including pain rating scale, medication(s)/side effects and non-pharmacologic comfort measures ?Outcome: Not Progressing ?  ?Problem: Health Behavior/Discharge Planning: ?Goal: Ability to manage health-related needs will improve ?Outcome: Not Progressing ?  ?Problem: Clinical Measurements: ?Goal: Ability to maintain clinical measurements within normal limits will improve ?Outcome: Not Progressing ?Goal: Will remain free from infection ?Outcome: Not Progressing ?Goal: Diagnostic test results will improve ?Outcome: Not Progressing ?  ?Problem: Activity: ?Goal: Risk for activity intolerance will decrease ?Outcome: Not Progressing ?  ?Problem: Coping: ?Goal: Level of anxiety will decrease ?Outcome: Not Progressing ?  ?Problem: Elimination: ?Goal: Will not experience complications related to bowel motility ?Outcome: Not Progressing ?Goal: Will not experience complications related to urinary retention ?Outcome: Not Progressing ?  ?Problem: Pain Managment: ?Goal: General experience of comfort will improve ?Outcome: Not Progressing ?  ?Problem: Safety: ?Goal: Ability to remain free from injury will improve ?Outcome: Not Progressing ?  ?Problem: Skin Integrity: ?Goal: Risk for impaired skin integrity will decrease ?Outcome: Not Progressing ?  ?

## 2022-01-06 NOTE — Progress Notes (Signed)
? ?NAME:  Victor Erickson, MRN:  [id number], DOB:  04-19-1961, LOS: 12 ?ADMISSION DATE:  12/25/2021, CONSULTATION DATE:  12/25/2021 ?REFERRING MD: Dr. Karene Fry CHIEF COMPLAINT:  Found down  ? ?History of Present Illness:  ?61 year old man who presented to [facility] 4/25 after being found down. PMHx significant for HTN, HLD, seizures, EtOH abuse. Required intubation for airway protection.  Noted to be in status epilepticus, rhabdomyolysis. Family reported that he decided to quit drinking last week. ? ?PCCM consulted for ICU admission for status epilepticus/EtOH withdrawal. ? ?Pertinent Medical History:  ?HTN, HLD, seizures, EtOH abuse ? ?Significant Hospital Events: ?Including procedures, antibiotic start and stop dates in addition to other pertinent events   ?4/25 admission, CTA head/neck NAICP, small area of encephalomalacial left ant frontal lobe likely from prior trauma, fluid throughout R scalp; CT C.AP> no hematoma, GGO RUL, infiltrates bilateral lower lobes, no RP hematoma, enlarged fatty liver; intubated, LTM started ?4/28 increased vimpat and phenobarb, continue keppra and wean propofol, continue versed infusion; stop zosyn, no seizures on EEG ?4/29 Fever, WBC increased ?4/30 Waking.  Ongoing seizures on EEG ?5/1 concern for ongoing seizure on EEG ?5/3 Continued subclinical seizures on EEG. Slightly improved neuro exam, not yet following commands. ?5/4 Slight improvement in neuro exam. Awake/calm, intermittently following commands. Tolerating PSV better than PRVC. LTM EEG, last seizure 2243. Febrile to 100.30F, broad spectrum abx started (cefepime, linezolid), Resp Cx with gram neg diplococci on prelim. ?5/5 Continued improvement in neuro exam, following commands more consistently. Off sedation. No further fevers. LTM EEG in place. Tolerating PSV 12/5. C-collar removed. ?5/6 no major issues overnight, remains on ventilator this AM.  Currently tolerating pressure support but requiring 12 over PEEP ?5/7 no major issues  overnight, slight agitation on ventilator with lightened sedation ? ?Interim History / Subjective:  ?Continues to track movement in room and follow simple commands to right side ? ?Objective:  ?Blood pressure 115/71, pulse 86, temperature 98.5 ?F (36.9 ?C), temperature source Oral, resp. rate (!) 21, height 6\' 1"  (1.854 m), weight 110.3 kg, SpO2 99 %. ?   ?Vent Mode: PSV;CPAP ?FiO2 (%):  [40 %] 40 % ?Set Rate:  [16 bmp] 16 bmp ?Vt Set:  mL] 630 mL ?PEEP:  [5 cmH20] 5 cmH20 ?Pressure Support:  [10 cmH20] 10 cmH20 ?Plateau Pressure:  [18 cmH20-19 cmH20] 18 cmH20  ? ?Intake/Output Summary (Last 24 hours) at 01/06/2022 0804 ?Last data filed at 01/06/2022 0700 ?Gross per 24 hour  ?Intake 2714.92 ml  ?Output 4850 ml  ?Net -2135.08 ml  ? ? ?Filed Weights  ? 01/01/22 0425 01/02/22 0500 01/06/22 0500  ?Weight: 108.7 kg 110.7 kg 110.3 kg  ? ?Physical Examination: ?General: Acute on chronic ill-appearing middle-aged male lying in bed on mechanical ventilation in no acute distress ?HEENT: ETT, MM pink/moist, PERRL,  ?Neuro: Alert and able to follow simple commands mostly on the right side, flicker of movement to pain on right ?CV: s1s2 regular rate and rhythm, no murmur, rubs, or gallops,  ?PULM: Clear to auscultation bilaterally, no increased work of breathing, no added breath sounds, tolerating ventilator ?GI: soft, bowel sounds active in all 4 quadrants, non-tender, non-distended, tolerating TF ?Extremities: warm/dry, no edema  ?Skin: no rashes or lesions ? ?Resolved Hospital Problem List:  ?Aspiration Pneumonia ?Hypernatremia ?Found down, wearing hard collar per protocol ?-No acute fracture on CT but has not been able to clear from soft tissue standpoint.  C-collar removed 5/5 ? ?Assessment & Plan:  ? ?Status Epilepticus  ?-Requiring burst suppression >  subclinical seizure 4/30 > ongoing through 5/2 ?Flaccid right side ?-AM assessment 5/6 reveals 0/5 strength in right upper and lower extremity.  Bedside RN reports this is  patient's baseline since admission ?P: ?Primary management per neurology ?Maintain neuro protective measures; goal for eurothermia, euglycemia, eunatermia, normoxia, and PCO2 goal of 35-40 ?Nutrition and bowel regiment  ?Seizure precautions  ?AEDs per neurology  ?Aspirations precautions  ?Continuous EEG per neurology ? ?History of Alcohol Abuse ?EtOH withdrawal  ?EtOH hepatitis ?P: ?Out of withdrawal window ?Trend LFTs ?Avoid hepatotoxins ?Continue to supplement thiamine folate and multivitamin ? ?Acute Respiratory Failure with Hypoxemia ?P: ?Continue to tolerate SBT well but unfortunately requires additional PEEP of her pressure to maintain tidal volume and consider trial of extubation soon but high suspicion for failure and likely need for tracheostomy ?Continue ventilator support with lung protective strategies  ?Wean PEEP and FiO2 for sats greater than 90%. ?Head of bed elevated 30 degrees. ?Plateau pressures less than 30 cm H20.  ?Follow intermittent chest x-ray and ABG.   ?SAT/SBT as tolerated, mentation preclude extubation  ?Ensure adequate pulmonary hygiene  ?Follow cultures  ?VAP bundle in place  ?PAD protocol ? ?Aspiration pneumonia vs. HCAP ?-Resp Cx with gram negative diplococci. ?P: ?Remains on empiric ceftriaxone ?Abortive care ?Aspiration precautions ? ?Rhabdomyolysis  ?-CK total is downtrending ?Urinary retention ?P: ?Closely monitor urine output ?Avoid nephrotoxins ?Ensure renal perfusion ?Continue external catheter ?Needed bladder scans ? ?Pressure Injures POA ?P: ?Continue pressure alleviating devices ?WOC nurse following ?Optimize nutrition ? ?Marked volume overload ?-Intake and output review reveals patient is currently 27 L positive for admission ?P: ?Tolerated gentle diuresing 5/6 ?Diurese again today  ?Strict intake and output ? ?Best Practice (right click and "Reselect all SmartList Selections" daily)  ?Diet/type: tubefeeds ?DVT prophylaxis: prophylactic heparin  ?GI prophylaxis: PPI ?Lines:  N/A ?Foley:  N/A ?Code Status:  full code ?Last date of multidisciplinary goals of care discussion: 5/3 Patient's brother, sister updated at bedside.   ? ?Critical care time:   ? ?Performed by: Lynell Greenhouse D. Harris ? ? ?Total critical care time: 37 minutes ? ?Critical care time was exclusive of separately billable procedures and treating other patients. ? ?Critical care was necessary to treat or prevent imminent or life-threatening deterioration. ? ?Critical care was time spent personally by me on the following activities: development of treatment plan with patient and/or surrogate as well as nursing, discussions with consultants, evaluation of patient's response to treatment, examination of patient, obtaining history from patient or surrogate, ordering and performing treatments and interventions, ordering and review of laboratory studies, ordering and review of radiographic studies, pulse oximetry and re-evaluation of patient's condition. ? ?Aracelie Addis D. Harris, NP-C ?[facility] ?Personal contact information can be found on Amion  ?01/06/2022, 8:04 AM ? ? ?

## 2022-01-07 ENCOUNTER — Encounter (HOSPITAL_COMMUNITY): Payer: Self-pay | Admitting: Pulmonary Disease

## 2022-01-07 LAB — BASIC METABOLIC PANEL
Anion gap: 10 (ref 5–15)
Anion gap: 10 (ref 5–15)
BUN: 17 mg/dL (ref 6–20)
BUN: 16 mg/dL (ref 6–20)
CO2: 27 mmol/L (ref 22–32)
CO2: 29 mmol/L (ref 22–32)
Calcium: 8.4 mg/dL — ABNORMAL LOW (ref 8.9–10.3)
Calcium: 8.2 mg/dL — ABNORMAL LOW (ref 8.9–10.3)
Chloride: 99 mmol/L (ref 98–111)
Chloride: 98 mmol/L (ref 98–111)
Creatinine, Ser: 0.52 mg/dL — ABNORMAL LOW (ref 0.61–1.24)
Creatinine, Ser: 0.55 mg/dL — ABNORMAL LOW (ref 0.61–1.24)
GFR, Estimated: >60 mL/min (ref >60)
GFR, Estimated: >60 mL/min (ref >60)
Glucose, Bld: 118 mg/dL — ABNORMAL HIGH (ref 70–99)
Glucose, Bld: 117 mg/dL — ABNORMAL HIGH (ref 70–99)
Potassium: 5.6 mmol/L — ABNORMAL HIGH (ref 3.5–5.1)
Potassium: 4 mmol/L (ref 3.5–5.1)
Sodium: 136 mmol/L (ref 135–145)
Sodium: 137 mmol/L (ref 135–145)

## 2022-01-07 LAB — MAGNESIUM: Magnesium: 1.7 mg/dL (ref 1.7–2.4)

## 2022-01-07 LAB — CBC
HCT: 33 % — ABNORMAL LOW (ref 39.0–52.0)
Hemoglobin: 10.6 g/dL — ABNORMAL LOW (ref 13.0–17.0)
MCH: 30.2 pg (ref 26.0–34.0)
MCHC: 32.1 g/dL (ref 30.0–36.0)
MCV: 94 fL (ref 80.0–100.0)
Platelets: UNDETERMINED 10*3/uL (ref 150–400)
RBC: 3.51 MIL/uL — ABNORMAL LOW (ref 4.22–5.81)
RDW: 14 % (ref 11.5–15.5)
WBC: 11.3 10*3/uL — ABNORMAL HIGH (ref 4.0–10.5)
nRBC: 0 % (ref 0.0–0.2)

## 2022-01-07 LAB — PHOSPHORUS: Phosphorus: 2.7 mg/dL (ref 2.5–4.6)

## 2022-01-07 LAB — GLUCOSE, CAPILLARY
Glucose-Capillary: 127 mg/dL — ABNORMAL HIGH (ref 70–99)
Glucose-Capillary: 113 mg/dL — ABNORMAL HIGH (ref 70–99)
Glucose-Capillary: 113 mg/dL — ABNORMAL HIGH (ref 70–99)
Glucose-Capillary: 103 mg/dL — ABNORMAL HIGH (ref 70–99)
Glucose-Capillary: 88 mg/dL (ref 70–99)
Glucose-Capillary: 97 mg/dL (ref 70–99)

## 2022-01-07 MED ORDER — SODIUM CHLORIDE 0.9 % IV BOLUS
500.0000 mL | Freq: Once | INTRAVENOUS | Status: AC
  Administered 2022-01-07: 500 mL via INTRAVENOUS

## 2022-01-07 MED ORDER — MEDIHONEY WOUND/BURN DRESSING EX PSTE
1.0000 "application " | PASTE | Freq: Every day | CUTANEOUS | Status: DC
  Administered 2022-01-07 – 2022-01-13 (×7): 1 via TOPICAL
  Filled 2022-01-07 (×2): qty 44

## 2022-01-07 MED ORDER — FUROSEMIDE 10 MG/ML IJ SOLN
40.0000 mg | Freq: Once | INTRAMUSCULAR | Status: AC
  Administered 2022-01-07: 40 mg via INTRAVENOUS
  Filled 2022-01-07: qty 4

## 2022-01-07 MED ORDER — GLYCOPYRROLATE 0.2 MG/ML IJ SOLN
0.1000 mg | Freq: Once | INTRAMUSCULAR | Status: AC
  Administered 2022-01-07: 0.1 mg via INTRAVENOUS
  Filled 2022-01-07: qty 1

## 2022-01-07 MED ORDER — MAGNESIUM SULFATE 2 GM/50ML IV SOLN
2.0000 g | Freq: Once | INTRAVENOUS | Status: AC
  Administered 2022-01-07: 2 g via INTRAVENOUS
  Filled 2022-01-07: qty 50

## 2022-01-07 NOTE — Progress Notes (Signed)
? ?NAME:  Victor Erickson, MRN:  [id number], DOB:  [birth year], LOS: 65 ?ADMISSION DATE:  12/25/2021, CONSULTATION DATE:  12/25/2021 ?REFERRING MD: Dr. Armandina Gemma CHIEF COMPLAINT:  Found down  ? ?History of Present Illness:  ?61 year old man who presented to [facility] 4/25 after being found down. PMHx significant for HTN, HLD, seizures, EtOH abuse. Required intubation for airway protection.  Noted to be in status epilepticus, rhabdomyolysis. Family reported that he decided to quit drinking last week. ? ?PCCM consulted for ICU admission for status epilepticus/EtOH withdrawal. ? ?Pertinent Medical History:  ?HTN, HLD, seizures, EtOH abuse ? ?Significant Hospital Events: ?Including procedures, antibiotic start and stop dates in addition to other pertinent events   ?4/25 admission, CTA head/neck NAICP, small area of encephalomalacial left ant frontal lobe likely from prior trauma, fluid throughout R scalp; CT C.AP> no hematoma, GGO RUL, infiltrates bilateral lower lobes, no RP hematoma, enlarged fatty liver; intubated, LTM started ?4/28 increased vimpat and phenobarb, continue keppra and wean propofol, continue versed infusion; stop zosyn, no seizures on EEG ?4/29 Fever, WBC increased ?4/30 Waking.  Ongoing seizures on EEG ?5/1 concern for ongoing seizure on EEG ?5/3 Continued subclinical seizures on EEG. Slightly improved neuro exam, not yet following commands. ?5/4 Slight improvement in neuro exam. Awake/calm, intermittently following commands. Tolerating PSV better than PRVC. LTM EEG, last seizure 2243. Febrile to 100.35F, broad spectrum abx started (cefepime, linezolid), Resp Cx with gram neg diplococci on prelim. ?5/5 Continued improvement in neuro exam, following commands more consistently. Off sedation. No further fevers. LTM EEG in place. Tolerating PSV 12/5. C-collar removed. ?5/6 no major issues overnight, remains on ventilator this AM.  Currently tolerating pressure support but requiring 12 over PEEP ?5/7 no major issues  overnight, slight agitation on ventilator with lightened sedation. Tracks movements, follows simple commands  ? ?Interim History / Subjective:  ?Afebrile / tmax 100.4 ?On PSV 8/5 wean, 40% ?Glucose range 113-127 ?I/o 4.3L UOP, -1.2L in last 24 hours ? ?Objective:  ?Blood pressure (!) 94/58, pulse 72, temperature 98.5 ?F (36.9 ?C), temperature source Oral, resp. rate 16, height 6\' 1"  (1.854 m), weight 107.3 kg, SpO2 98 %. ?   ?Vent Mode: PSV;CPAP ?FiO2 (%):  [40 %] 40 % ?Set Rate:  [16 bmp] 16 bmp ?Vt Set:  [id number] mL] 630 mL ?PEEP:  [5 cmH20] 5 cmH20 ?Pressure Support:  [5 cmH20-10 cmH20] 10 cmH20 ?Plateau Pressure:  [17 cmH20-18 cmH20] 17 cmH20  ? ?Intake/Output Summary (Last 24 hours) at 01/07/2022 0817 ?Last data filed at 01/07/2022 0700 ?Gross per 24 hour  ?Intake 3189.42 ml  ?Output 4500 ml  ?Net -1310.58 ml  ? ?Filed Weights  ? 01/02/22 0500 01/06/22 0500 01/07/22 0518  ?Weight: 110.7 kg 110.3 kg 107.3 kg  ? ?Physical Examination: ?General: critically ill appearing adult male lying in bed in NAD   ?HEENT: MM pink/moist, ETT, anicteric, left pupil irregular shape (chronic), lip swelling ?Neuro: Awakens to voice, tracks provider, follows simple commands  ?CV: s1s2 RRR, SR on monitor, no m/r/g ?PULM: non-labored at rest, lungs bilaterally with soft rhonchi  ?GI: soft, bsx4 active  ?Extremities: warm/dry, trace dependent edema  ?Skin: no rashes or lesions ? ?Resolved Hospital Problem List:  ?Aspiration Pneumonia ?Hypernatremia ?Found down, wearing hard collar per protocol ?-No acute fracture on CT but has not been able to clear from soft tissue standpoint.  C-collar removed 5/5 ?Rhabdomyolysis  ? ?Assessment & Plan:  ? ?Status Epilepticus  ?Flaccid right side ?Requiring burst suppression > subclinical seizure 4/30 > ongoing through  5/2. AM assessment 5/6 with 0/5 strength in right upper and lower extremity.  Bedside RN reports this is patient's baseline since admission ?-mgmt per Neurology  ?-seizure precautions   ?-continue anti-epileptics  ?-PT efforts  ?-neuro protective measures  ? ?Hx Alcohol Abuse ?EtOH Withdrawal  ?EtOH hepatitis ?Out of withdrawal window ?-follow LFT's intermittently  ?-cessation counseling when able  ?-continue thiamine, folate, MVI ? ?Acute Respiratory Failure with Hypoxemia ?-D12 of ETT, will need to review with family  ?-daily SBT / WUA  as tolerated  ?-likely will need trach if family wants to pursue support  ?-PRVC 8cc/kg as rest mode ventilation  ?-VAP prevention measures  ?-intermittent CXR ?-PAD protocol for RASS goal 0 to -1  ? ?Aspiration pneumonia vs. HCAP ?Resp Cx with gram negative diplococci. ?-empiric aspiration coverage  ?-aspiration precautions ? ?Urinary retention ?-continue external catheter  ?-follow I/O's ? ?Pressure Injures POA ?-wound care  ?-pressure alleviating measures  ?-optimize nutrition  ? ?Volume Overload ?Intake and output review reveals patient is currently 27 L positive for admission ?-lasix 40 mg IV x1  ?-follow I/O's  ?-monitor electrolytes with diuresis ? ?Hyperglycemia  ?-SSI, standard scale  ? ?Best Practice (right click and "Reselect all SmartList Selections" daily)  ?Diet/type: tubefeeds ?DVT prophylaxis: prophylactic heparin  ?GI prophylaxis: PPI ?Lines: N/A ?Foley:  N/A ?Code Status:  full code ?Last date of multidisciplinary goals of care discussion: 5/3 Patient's brother, sister updated at bedside.  ? ?Critical Care Time: 35 minutes   ? ?Noe Gens, MSN, APRN, NP-C, AGACNP-BC ?[facility] ?01/07/2022, 8:17 AM ? ? ?Please see [email or web address] for pager details.  ? ?From 7A-7P if no response, please call [phone] ?After hours, please call Warren Lacy [phone] ? ? ? ?

## 2022-01-07 NOTE — Consult Note (Signed)
WOC Nurse wound follow up ?Wound type: perioral breakdown to skin beneath nose, above lip on right side.  COnsistent with device related skin injury from ET tube.  Area is now being padded.  LAst assessment was red wound bed.  Today, yellow fibrin is present.  Will implement medihoney to debride and promote healing.  ?Measurement: 0.7 cm x 1 cm unable to visualize deepest part of wound bed.  ?Wound bed: 30% fibrin, 70% pink ?Drainage (amount, consistency, odor) minimal serosanguinous  ?Periwound:intact ?Dressing procedure/placement/frequency: Cleanse wound above lip with NS.  Apply medihoney to open wound.  Cover with foam dressing to pad/protect.  Change daily.  RT to assist with offloading/rotating ET tube to avoid pressure.  ?Will follow.  ?Maple Hudson MSN, RN, FNP-BC CWON ?Wound, Ostomy, Continence Nurse ?Pager [phone]  ?  ?

## 2022-01-07 NOTE — Plan of Care (Signed)
?  Problem: Safety: ?Goal: Non-violent Restraint(s) ?Outcome: Progressing ?  ?Problem: Activity: ?Goal: Risk for activity intolerance will decrease ?Outcome: Progressing ?  ?Problem: Health Behavior/Discharge Planning: ?Goal: Ability to manage health-related needs will improve ?Outcome: Not Progressing ?  ?

## 2022-01-07 NOTE — Plan of Care (Signed)
?  Problem: Clinical Measurements: ?Goal: Respiratory complications will improve ?Outcome: Progressing ?  ?Problem: Nutrition: ?Goal: Adequate nutrition will be maintained ?Outcome: Progressing ?  ?Problem: Safety: ?Goal: Non-violent Restraint(s) ?Outcome: Not Progressing ?  ?Problem: Education: ?Goal: Knowledge of General Education information will improve ?Description: Including pain rating scale, medication(s)/side effects and non-pharmacologic comfort measures ?Outcome: Not Progressing ?  ?Problem: Health Behavior/Discharge Planning: ?Goal: Ability to manage health-related needs will improve ?Outcome: Not Progressing ?  ?Problem: Clinical Measurements: ?Goal: Ability to maintain clinical measurements within normal limits will improve ?Outcome: Not Progressing ?Goal: Will remain free from infection ?Outcome: Not Progressing ?Goal: Diagnostic test results will improve ?Outcome: Not Progressing ?Goal: Cardiovascular complication will be avoided ?Outcome: Not Progressing ?  ?Problem: Activity: ?Goal: Risk for activity intolerance will decrease ?Outcome: Not Progressing ?  ?Problem: Coping: ?Goal: Level of anxiety will decrease ?Outcome: Not Progressing ?  ?Problem: Elimination: ?Goal: Will not experience complications related to bowel motility ?Outcome: Not Progressing ?Goal: Will not experience complications related to urinary retention ?Outcome: Not Progressing ?  ?Problem: Pain Managment: ?Goal: General experience of comfort will improve ?Outcome: Not Progressing ?  ?Problem: Safety: ?Goal: Ability to remain free from injury will improve ?Outcome: Not Progressing ?  ?Problem: Skin Integrity: ?Goal: Risk for impaired skin integrity will decrease ?Outcome: Not Progressing ?  ?

## 2022-01-07 NOTE — Progress Notes (Signed)
Nutrition Follow-up ? ?DOCUMENTATION CODES:  ? ?Non-severe (moderate) malnutrition in context of social or environmental circumstances ? ?INTERVENTION:  ? ?- Plan to exchange OG tube for Cortrak tomorrow, 01/09/22 ? ?Continue tube feeds via OG tube: ?- Vital 1.5 @ 60 ml/hr (1440 ml/day) ?- ProSource TF 45 ml TID ?- Free water flushes of 200 ml q 4 hours ? ?Tube feeding regimen provides 2280 kcal, 130 grams of protein, and 1100 ml of H2O. ? ?Total free water with flushes: 2300 ml ? ?- Continue MVI with minerals, folic acid, and thiamine per tube ? ?- Continue zinc sulfate 220 mg BID x 14 days for deficiency ? ?- Add 1 packet Juven BID per tube, each packet provides 95 calories, 2.5 grams of protein, and 9.8 grams of carbohydrate; also contains L-arginine and L-glutamine, vitamin C, vitamin E, vitamin B-12, zinc, calcium, and calcium Beta-hydroxy-Beta-methylbutyrate to support wound healing ? ?NUTRITION DIAGNOSIS:  ? ?Moderate Malnutrition related to social / environmental circumstances (relationship stress, EtOH abuse) as evidenced by mild fat depletion, moderate muscle depletion. ? ?Ongoing, being addressed via TF ? ?GOAL:  ? ?Patient will meet greater than or equal to 90% of their needs ? ?Met via TF ? ?MONITOR:  ? ?Vent status, Labs, Weight trends, TF tolerance, Skin, I & O's ? ?REASON FOR ASSESSMENT:  ? ?Ventilator, Consult ?Enteral/tube feeding initiation and management ? ?ASSESSMENT:  ? ?61 year old male who presented to the ED on 4/25 with respiratory distress and AMS. PMH of HTN, HLD, and EtOH abuse. Pt admitted with NSTEMI, AKI, seizure activity, acute respiratory failure. ? ?Discussed pt with RN and during ICU rounds. Pt may require tracheostomy. Plan for Cortrak tomorrow. Plan for Wallace meeting this afternoon with family. ? ?Noted rectal tube had to be removed this morning due to bleeding. Pt still having type 7 bowel movements. Will continue to monitor. ? ?First measured weight: 93.4 kg on 4/26 ?Current  weight: 104.4 kg ? ?Pt with mild pitting generalized edema, moderate pitting edema to BUE, and mild pitting edema to BLE. ? ?Current TF: Vital 1.5 @ 60 ml/hr, ProSource TF 45 ml TID, free water flushes of 200 ml q 4 hours ? ?Patient remains intubated on ventilator support ?MV: 9.6 L/min ?Temp (24hrs), Avg:99.2 ?F (37.3 ?C), Min:98.4 ?F (36.9 ?C), Max:100.1 ?F (37.8 ?C) ? ?Medications reviewed and include: colace, folic acid, SSI q 4 hours, MVI with minerals daily, phenobarbital, protonix, miralax, thiamine, zinc sulfate 220 mg BID ? ?Vitamin/Mineral Profile: ?Vitamin B12: 1731 (high) ?Vitamin B6: "unable to obtain valid result" ?Vitamin A: 30.5 (WNL) ?Vitamin C: 0.5 (low WNL) ?Zinc: 39 (low) ? ?Labs reviewed: hemoglobin 9.5 ?CBG's: 88-115 x 24 hours ? ?UOP: 4250 ml x 24 hours ?I/O's: +23.6 L since admit ? ?Diet Order:   ?Diet Order   ? ?       ?  Diet NPO time specified  Diet effective now       ?  ? ?  ?  ? ?  ? ? ?EDUCATION NEEDS:  ? ?Not appropriate for education at this time ? ?Skin:  Skin Assessment: ?Skin Integrity Issues: ?DTI: back, R hip, R shoulder, face ?Stage II: upper lip, R buttocks ? ?Last BM:  01/08/22 ? ?Height:  ? ?Ht Readings from Last 1 Encounters:  ?12/25/21 '6\' 1"'  (1.854 m)  ? ? ?Weight:  ? ?Wt Readings from Last 1 Encounters:  ?01/08/22 104.4 kg  ? ? ?BMI:  Body mass index is 30.37 kg/m?. ? ?Estimated Nutritional Needs:  ? ?Kcal:  2100-2300 ? ?Protein:  120-140 grams ? ?Fluid:  >2.0 L ? ? ? ?Gustavus Bryant, MS, RD, LDN ?Inpatient Clinical Dietitian ?Please see AMiON for contact information. ? ?

## 2022-01-07 NOTE — Progress Notes (Signed)
Subjective: No acute events overnight.  ? ?ROS: Unable to obtain due to poor mental status ? ?Examination ? ?Vital signs in last 24 hours: ?Temp:  [97.9 ?F (36.6 ?C)-100.4 ?F (38 ?C)] 98.5 ?F (36.9 ?C) (05/08 3419) ?Pulse Rate:  [70-87] 81 (05/08 1000) ?Resp:  [16-23] 19 (05/08 1000) ?BP: (90-108)/(54-66) 94/56 (05/08 1000) ?SpO2:  [92 %-100 %] 93 % (05/08 1000) ?FiO2 (%):  [40 %] 40 % (05/08 0800) ?Weight:  [107.3 kg] 107.3 kg (05/08 0518) ? ?General: lying in bed, NAD ?RS: Intubated ?Neuro: Opens eyes to verbal stimulation, didn't follows commands  PERRLA, no gaze deviation, blinks to threat BL, withdraws to noxious stimuli in all 4 extremities L>R ? ?Basic Metabolic Panel: ?Recent Labs  ?Lab 01/03/22 ?0135 01/04/22 ?6222 01/05/22 ?9798 01/06/22 ?9211 01/07/22 ?9417 01/07/22 ?4081  ?NA 137 136 137 138 136 137  ?K 4.7 4.0 4.2 4.6 5.6* 4.0  ?CL 104 104 105 103 99 98  ?CO2 26 25 25 28 27 29   ?GLUCOSE 140* 115* 109* 103* 118* 117*  ?BUN 12 13 12 13 17 16   ?CREATININE 0.59* 0.55* 0.53* 0.54* 0.52* 0.55*  ?CALCIUM 8.1* 7.8* 8.1* 8.3* 8.4* 8.2*  ?MG 1.6* 1.8 1.7  --  1.7  --   ?PHOS 3.4  --  2.8  --  2.7  --   ? ? ?CBC: ?Recent Labs  ?Lab 01/02/22 ?0251 01/03/22 ?0830 01/04/22 ?0752 01/05/22 ?0348 01/07/22 ?0224  ?WBC 11.6* 16.2* 15.2* 12.1* 11.3*  ?HGB 9.3* 9.7* 9.2* 9.2* 10.6*  ?HCT 29.4* 30.5* 28.4* 29.0* 33.0*  ?MCV 97.0 96.5 96.3 95.7 94.0  ?PLT 238 253 277 308 PLATELET CLUMPS NOTED ON SMEAR, UNABLE TO ESTIMATE  ? ? ? ?Coagulation Studies: ?No results for input(s): LABPROT, INR in the last 72 hours. ? ?Imaging ?No new brain imaging overnight ?  ?ASSESSMENT AND PLAN:  61 year old male who was found down and noted to have seizures arising from the left hemisphere. ?  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 ?  ?Recommendations ?-Continue IV perampanel to 4 mg nightly, Keppra 2000 mg  twice daily, Vimpat 200 mg twice daily, phenobarbital 97.5 mg twice daily daily ?-We will discuss with ICU team and if agitation is not a concern, will start weaning off Valium. ?-Continue seizure precautions ?-Management of rest of comorbidities per primary team ?  ?I have spent a total of 36 minutes with the patient reviewing hospital notes,  test results, labs and examining the patient as well as establishing an assessment and plan.   > 50% of time was spent in direct patient care. ? ? ?03/09/22 ?Epilepsy ?[facility] ?For questions after 5pm please refer to AMION to reach the Neurologist on call ? ?

## 2022-01-07 NOTE — Progress Notes (Signed)
eLink Physician-Brief Progress Note ?Patient Name: Victor Erickson ?DOB: 1961/03/09 ?MRN: [id number] ? ? ?Date of Service ? 01/07/2022  ?HPI/Events of Note ? RN asking for Robinul to help with secretions     FYI   K+ 5.6 but probably hemolized, so placed a reorder for BMP  ?eICU Interventions ? Robinul low dose ordered once. Follow BMP, if K elevated to call back.   ? ? ? ?Intervention Category ?Minor Interventions: Electrolytes abnormality - evaluation and management;Routine modifications to care plan (e.g. PRN medications for pain, fever) ? ?Ranee Gosselin ?01/07/2022, 5:58 AM ?

## 2022-01-07 NOTE — Assessment & Plan Note (Signed)
Continue tube feeds.   

## 2022-01-08 LAB — CBC
HCT: 28.7 % — ABNORMAL LOW (ref 39.0–52.0)
Hemoglobin: 9.3 g/dL — ABNORMAL LOW (ref 13.0–17.0)
MCH: 30.6 pg (ref 26.0–34.0)
MCHC: 32.4 g/dL (ref 30.0–36.0)
MCV: 94.4 fL (ref 80.0–100.0)
Platelets: 369 10*3/uL (ref 150–400)
RBC: 3.04 MIL/uL — ABNORMAL LOW (ref 4.22–5.81)
RDW: 14.1 % (ref 11.5–15.5)
WBC: 10.3 10*3/uL (ref 4.0–10.5)
nRBC: 0 % (ref 0.0–0.2)

## 2022-01-08 LAB — HEMOGLOBIN AND HEMATOCRIT, BLOOD
HCT: 28.9 % — ABNORMAL LOW (ref 39.0–52.0)
HCT: 31.3 % — ABNORMAL LOW (ref 39.0–52.0)
HCT: 29.6 % — ABNORMAL LOW (ref 39.0–52.0)
Hemoglobin: 9.5 g/dL — ABNORMAL LOW (ref 13.0–17.0)
Hemoglobin: 10.1 g/dL — ABNORMAL LOW (ref 13.0–17.0)
Hemoglobin: 9.6 g/dL — ABNORMAL LOW (ref 13.0–17.0)

## 2022-01-08 LAB — GLUCOSE, CAPILLARY
Glucose-Capillary: 110 mg/dL — ABNORMAL HIGH (ref 70–99)
Glucose-Capillary: 112 mg/dL — ABNORMAL HIGH (ref 70–99)
Glucose-Capillary: 115 mg/dL — ABNORMAL HIGH (ref 70–99)
Glucose-Capillary: 121 mg/dL — ABNORMAL HIGH (ref 70–99)
Glucose-Capillary: 130 mg/dL — ABNORMAL HIGH (ref 70–99)
Glucose-Capillary: 91 mg/dL (ref 70–99)

## 2022-01-08 LAB — BASIC METABOLIC PANEL
Anion gap: 9 (ref 5–15)
BUN: 17 mg/dL (ref 6–20)
CO2: 27 mmol/L (ref 22–32)
Calcium: 8.1 mg/dL — ABNORMAL LOW (ref 8.9–10.3)
Chloride: 101 mmol/L (ref 98–111)
Creatinine, Ser: 0.47 mg/dL — ABNORMAL LOW (ref 0.61–1.24)
GFR, Estimated: >60 mL/min (ref >60)
Glucose, Bld: 116 mg/dL — ABNORMAL HIGH (ref 70–99)
Potassium: 4.1 mmol/L (ref 3.5–5.1)
Sodium: 137 mmol/L (ref 135–145)

## 2022-01-08 LAB — MAGNESIUM: Magnesium: 1.7 mg/dL (ref 1.7–2.4)

## 2022-01-08 MED ORDER — MAGNESIUM SULFATE 2 GM/50ML IV SOLN
2.0000 g | Freq: Once | INTRAVENOUS | Status: AC
  Administered 2022-01-08: 2 g via INTRAVENOUS
  Filled 2022-01-08: qty 50

## 2022-01-08 MED ORDER — DIAZEPAM 5 MG PO TABS
2.5000 mg | ORAL_TABLET | Freq: Three times a day (TID) | ORAL | Status: AC
  Administered 2022-01-08 – 2022-01-11 (×8): 2.5 mg
  Filled 2022-01-08 (×8): qty 1

## 2022-01-08 MED ORDER — JUVEN PO PACK
1.0000 | PACK | Freq: Two times a day (BID) | ORAL | Status: DC
  Administered 2022-01-08 – 2022-01-13 (×10): 1
  Filled 2022-01-08 (×10): qty 1

## 2022-01-08 MED ORDER — DIAZEPAM 2 MG PO TABS: 2.0000 mg | ORAL_TABLET | Freq: Two times a day (BID) | ORAL | Status: DC

## 2022-01-08 MED ORDER — DIAZEPAM 5 MG/ML IJ SOLN
5.0000 mg | Freq: Three times a day (TID) | INTRAMUSCULAR | Status: DC
  Administered 2022-01-08: 5 mg via INTRAVENOUS
  Filled 2022-01-08: qty 2

## 2022-01-08 MED ORDER — FUROSEMIDE 10 MG/ML IJ SOLN
40.0000 mg | Freq: Once | INTRAMUSCULAR | Status: AC
  Administered 2022-01-08: 40 mg via INTRAVENOUS
  Filled 2022-01-08: qty 4

## 2022-01-08 NOTE — TOC Progression Note (Signed)
Transition of Care (TOC) - Progression Note  ? ? ?Patient Details  ?Name: Victor Erickson ?MRN: [id number] ?Date of Birth: 29-May-1961 ? ?Transition of Care (TOC) CM/SW Contact  ?Tom-Johnson, Renea Ee, RN ?Phone Number: ?01/08/2022, 5:12 PM ? ?Clinical Narrative:    ? ?Patient continues to be intubated. He awakens to voice, tracks and follows simple commands on left side. Has Flexiseal. Plan/Goals of care discussion with wife and family. Continue current care plan. ?No TOC needs or recommendations noted at this time. CM will continue to follow with needs. ?  ?  ? ?Expected Discharge Plan and Services ?  ?  ?  ?  ?  ?                ?  ?  ?  ?  ?  ?  ?  ?  ?  ?  ? ? ?Social Determinants of Health (SDOH) Interventions ?  ? ?Readmission Risk Interventions ?   ? View : No data to display.  ?  ?  ?  ? ? ?

## 2022-01-08 NOTE — Progress Notes (Signed)
Subjective: NAEO.  ? ?ROS: Unable to obtain due to poor mental status ? ?Examination ? ?Vital signs in last 24 hours: ?Temp:  [97.8 ?F (36.6 ?C)-100.1 ?F (37.8 ?C)] 99.4 ?F (37.4 ?C) (05/09 0747) ?Pulse Rate:  [62-90] 84 (05/09 1030) ?Resp:  [16-23] 23 (05/09 1030) ?BP: (86-108)/(50-67) 106/57 (05/09 0800) ?SpO2:  [93 %-100 %] 96 % (05/09 1030) ?FiO2 (%):  [40 %] 40 % (05/09 1030) ?Weight:  [104.4 kg] 104.4 kg (05/09 0145) ? ?General: lying in bed, NAD ?RS: Intubated ?Neuro: Opens eyes to verbal stimulation, didn't follows commands PERRLA, no gaze deviation, blinks to threat BL, withdraws to noxious stimuli in all 4 extremities L>R ? ?Basic Metabolic Panel: ?Recent Labs  ?Lab 01/03/22 ?7846 01/04/22 ?9629 01/05/22 ?5284 01/06/22 ?1324 01/07/22 ?4010 01/07/22 ?2725 01/08/22 ?0413  ?NA 137 136 137 138 136 137 137  ?K 4.7 4.0 4.2 4.6 5.6* 4.0 4.1  ?CL 104 104 105 103 99 98 101  ?CO2 26 25 25 28 27 29 27   ?GLUCOSE 140* 115* 109* 103* 118* 117* 116*  ?BUN 12 13 12 13 17 16 17   ?CREATININE 0.59* 0.55* 0.53* 0.54* 0.52* 0.55* 0.47*  ?CALCIUM 8.1* 7.8* 8.1* 8.3* 8.4* 8.2* 8.1*  ?MG 1.6* 1.8 1.7  --  1.7  --  1.7  ?PHOS 3.4  --  2.8  --  2.7  --   --   ? ? ?CBC: ?Recent Labs  ?Lab 01/03/22 ?0830 01/04/22 ?03/05/22 01/05/22 ?3664 01/07/22 ?4034 01/08/22 ?0413 01/08/22 ?03/10/22  ?WBC 16.2* 15.2* 12.1* 11.3* 10.3  --   ?HGB 9.7* 9.2* 9.2* 10.6* 9.3* 9.5*  ?HCT 30.5* 28.4* 29.0* 33.0* 28.7* 28.9*  ?MCV 96.5 96.3 95.7 94.0 94.4  --   ?PLT 253 277 308 PLATELET CLUMPS NOTED ON SMEAR, UNABLE TO ESTIMATE 369  --   ? ? ? ?Coagulation Studies: ?No results for input(s): LABPROT, INR in the last 72 hours. ? ?Imaging ?No new brain imaging overnight ?  ?ASSESSMENT AND PLAN:  61 year old male who was found down and noted to have seizures arising from the left hemisphere. ?  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 ?   ?Recommendations ?-Continue IV perampanel to 4 mg nightly, Keppra 2000 mg twice daily, Vimpat 200 mg twice daily, phenobarbital 97.5 mg twice daily daily ?-will reduce valium to 5mg  TID ?-Continue seizure precautions ?-Management of rest of comorbidities per primary team ?  ?I have spent a total of 36 minutes with the patient reviewing hospital notes,  test results, labs and examining the patient as well as establishing an assessment and plan. > 50% of time was spent in direct patient care. ? ? ?9563 ?Epilepsy ?[facility] ?For questions after 5pm please refer to AMION to reach the Neurologist on call ? ?

## 2022-01-08 NOTE — Progress Notes (Signed)
[facility] ADULT ICU REPLACEMENT PROTOCOL ? ? ?The patient does apply for the [facility] Adult ICU Electrolyte Replacment Protocol based on the criteria listed below:  ? ?1.Exclusion criteria: TCTS patients, ECMO patients, and Dialysis patients ?2. Is GFR >/= 30 ml/min? Yes.    ?Patient's GFR today is >60 ?3. Is SCr </= 2? Yes.   ?Patient's SCr is 0.47 mg/dL ?4. Did SCr increase >/= 0.5 in 24 hours? No. ?5.Pt's weight >40kg  Yes.   ?6. Abnormal electrolyte(s):   Mg 1.7  ?7. Electrolytes replaced per protocol ?8.  Call MD STAT for K+ </= 2.5, Phos </= 1, or Mag </= 1 ?Physician:  Lona Kettle ? ?Victor Erickson 01/08/2022 5:19 AM ? ?

## 2022-01-08 NOTE — IPAL (Addendum)
?  Interdisciplinary Goals of Care Family Meeting ? ? ?Date carried out:: 01/08/2022 ? ?Location of the meeting: Conference room ? ?Member's involved: Nurse Practitioner, Bedside Registered Nurse, and Family Member or next of kin ? ?Durable Power of Insurance risk surveyor: Wife Victor Erickson & son Victor Erickson.  Brother, step daughter and daughter in law present for meeting.  ? ?Discussion: We discussed goals of care for Victor Erickson . Family discussion with wife, patients step daughter, son and brother in room.  Reviewed patient is nearing 2 weeks on mechanical ventilation & decision of transitioning airway to tracheostomy for continued support if they want to give him more time to assess his neuro recovery. Discussed potential needs for feeding tube and at minimum short term support in a facility. Reviewed that tracheostomy is a reversible process.  We reviewed the possibility of recovery with longer duration of support but that he may also not improve further.  Also discussed possible one way extubation for patient as additional path.  After discussion, family indicates they do not want to pursue tracheostomy. Discussed reduction of valium & extubation.  Reviewed that our hope is he will be successful but also that he may fail and need transition to comfort measures.  Support offered.  No change in plan for 5/9.  Continue current level of support.  Will revisit possible one way extubation in 5/10, 5/11.   ? ?Code status: Full DNR ? ?Disposition: Continue current acute care ? ? ?Time spent for the meeting: 25 minutes ? ? ?Canary Brim, MSN, APRN, NP-C, AGACNP-BC ? [facility] ?01/08/2022, 3:52 PM ? ? ?Please see [email or web address] for pager details.  ? ?From 7A-7P if no response, please call [phone] ?After hours, please call ELink [phone] ? ?

## 2022-01-08 NOTE — Progress Notes (Signed)
eLink Physician-Brief Progress Note ?Patient Name: Victor Erickson ?DOB: 09/07/1960 ?MRN: [id number] ? ? ?Date of Service ? 01/08/2022  ?HPI/Events of Note ? Patient has a Flexiseal in place and is now having rectal bleeding.  ?eICU Interventions ? Discontinue Flexiseal, Trend H and H. Low threshold for GI consultation for Flexible Sigmoidoscopy.  ? ? ? ?  ? ?Victor Erickson ?01/08/2022, 6:22 AM ?

## 2022-01-08 NOTE — Progress Notes (Signed)
? ?NAME:  Victor Erickson, MRN:  [id number], DOB:  April 08, 1961, LOS: 73 ?ADMISSION DATE:  12/25/2021, CONSULTATION DATE:  12/25/2021 ?REFERRING MD: Dr. Armandina Gemma CHIEF COMPLAINT:  Found down  ? ?History of Present Illness:  ?61 year old man who presented to [facility] 4/25 after being found down. PMHx significant for HTN, HLD, seizures, EtOH abuse. Required intubation for airway protection.  Noted to be in status epilepticus, rhabdomyolysis. Family reported that he decided to quit drinking last week. ? ?PCCM consulted for ICU admission for status epilepticus/EtOH withdrawal. ? ?Pertinent Medical History:  ?HTN, HLD, seizures, EtOH abuse ? ?Significant Hospital Events: ?Including procedures, antibiotic start and stop dates in addition to other pertinent events   ?4/25 admission, CTA head/neck NAICP, small area of encephalomalacial left ant frontal lobe likely from prior trauma, fluid throughout R scalp; CT C.AP> no hematoma, GGO RUL, infiltrates bilateral lower lobes, no RP hematoma, enlarged fatty liver; intubated, LTM started ?4/28 increased vimpat and phenobarb, continue keppra and wean propofol, continue versed infusion; stop zosyn, no seizures on EEG ?4/29 Fever, WBC increased ?4/30 Waking.  Ongoing seizures on EEG ?5/1 concern for ongoing seizure on EEG ?5/3 Continued subclinical seizures on EEG. Slightly improved neuro exam, not yet following commands. ?5/4 Slight improvement in neuro exam. Awake/calm, intermittently following commands. Tolerating PSV better than PRVC. LTM EEG, last seizure 2243. Febrile to 100.63F, broad spectrum abx started (cefepime, linezolid), Resp Cx with gram neg diplococci on prelim. ?5/5 Continued improvement in neuro exam, following commands more consistently. Off sedation. No further fevers. LTM EEG in place. Tolerating PSV 12/5. C-collar removed. ?5/6 no major issues overnight, remains on ventilator this AM.  Currently tolerating pressure support but requiring 12 over PEEP ?5/7 no major issues  overnight, slight agitation on ventilator with lightened sedation. Tracks movements, follows simple commands  ? ?Interim History / Subjective:  ?Tmax 99.4 ?Weaning on PSV  ?RN notes ongoing swelling of upper lip despite multiple adjustments of tube holder per RT  ?I/O 4.2L UOP, -500 ml in last 24 hours  ?Glucose range 110-116 ? ?Objective:  ?Blood pressure (!) 106/57, pulse 90, temperature 99.4 ?F (37.4 ?C), temperature source Oral, resp. rate (!) 22, height 6\' 1"  (1.854 m), weight 104.4 kg, SpO2 96 %. ?   ?Vent Mode: PSV;CPAP ?FiO2 (%):  [40 %] 40 % ?Set Rate:  [16 bmp] 16 bmp ?Vt Set:  [id number] mL] 630 mL ?PEEP:  [5 cmH20] 5 cmH20 ?Pressure Support:  [8 cmH20] 8 cmH20 ?Plateau Pressure:  [18 cmH20-22 cmH20] 22 cmH20  ? ?Intake/Output Summary (Last 24 hours) at 01/08/2022 0843 ?Last data filed at 01/08/2022 0800 ?Gross per 24 hour  ?Intake 3983.36 ml  ?Output 4350 ml  ?Net -366.64 ml  ? ?Filed Weights  ? 01/06/22 0500 01/07/22 0518 01/08/22 0145  ?Weight: 110.3 kg 107.3 kg 104.4 kg  ? ?Physical Examination: ?General: critically ill appearing adult male lying in bed in NAD ?HEENT: MM pink/moist, ETT, upper lip edema, eyes open, pupils reactive / left irregular (chronic) ?Neuro: Awakens to voice, tracks provider, will follow simple commands on left ?CV: s1s2 RRR, SR in 90's, no m/r/g ?PULM: non-labored at rest on PSV, diminished breath sounds, bloody mucus plugs removed with ballard, mild resistance to passing suction catheter ?GI: soft, bsx4 active  ?Extremities: warm/dry, 1+ generalized dependent edema  ?Skin: areas of pressure from pre-admit unchanged  ? ?Resolved Hospital Problem List:  ?Aspiration Pneumonia ?Hypernatremia ?Found down, wearing hard collar per protocol ?-No acute fracture on CT but has not been able to  clear from soft tissue standpoint.  C-collar removed 5/5 ?Rhabdomyolysis  ?Demand Ischemia ? ?Assessment & Plan:  ? ?Non-Convulsive Status Epilepticus  ?Flaccid right side ?Requiring burst suppression >  subclinical seizure 4/30 > ongoing through 5/2. AM assessment 5/6 with 0/5 strength in right upper and lower extremity.  Bedside RN reports this is patient's baseline since admission ?-antiepileptics, valium per Neurology  ?-seizure precautions  ?-follow neuro exam  ?-neuro protective measures  ? ?Hx Alcohol Abuse ?EtOH Withdrawal  ?EtOH hepatitis ?Out of withdrawal window ?-cessation counseling when able  ?-folate, MVI, thiamine  ? ?Acute Respiratory Failure with Hypoxemia ?-D13 ETT ?-daily SBT / WUA  ?-PRVC 8cc/kg  ?-wean PEEP / FiO2 for sats >90% ?-PAD protocol with RASS GOAL 0 to -1  ?-follow intermittent CXR  ? ?Aspiration pneumonia vs. HCAP ?Resp Cx with gram negative diplococci. ?-completed empiric aspiration coverage   ?-VAP prevention measures  ? ?Urinary retention ?-follow I/O's ?-monitor output with external catheter  ? ?Pressure Injures POA ?-wound care ?-pressure relieving measures ?-ensure adequate nutrition  ? ?Volume Overload ?Intake and output review reveals patient is currently 27 L positive for admission ?-lasix 40 mg IV x1 ?-follow I/O's  ?-monitor electrolytes with diuresis  ? ?Hypomagnesemia  ?-monitor, replaced 5/9  ? ?Hyperglycemia  ?-SSI, continue standard scale  ? ?Best Practice (right click and "Reselect all SmartList Selections" daily)  ?Diet/type: tubefeeds ?DVT prophylaxis: prophylactic heparin  ?GI prophylaxis: PPI ?Lines: N/A ?Foley:  N/A ?Code Status:  full code ?Last date of multidisciplinary goals of care discussion: Reviewed plan of care with patients wife in detail - discussed option of trach and giving more time for patient to potentially recover.  Reviewed down stream issues that can arise with trach (feeding tube need, facility placement etc).  She indicates she feels he would not want a trach but will discuss with son.  Family planning to meet this afternoon with providers to discuss further.   ? ? ?Critical Care Time: 36 minutes   ? ?Noe Gens, MSN, APRN, NP-C,  AGACNP-BC ?[facility] ?01/08/2022, 8:43 AM ? ? ?Please see [email or web address] for pager details.  ? ?From 7A-7P if no response, please call [phone] ?After hours, please call Warren Lacy [phone] ? ? ? ?

## 2022-01-09 ENCOUNTER — Inpatient Hospital Stay (HOSPITAL_COMMUNITY): Payer: BC Managed Care – PPO

## 2022-01-09 DIAGNOSIS — L89219 Pressure ulcer of right hip, unspecified stage: Secondary | ICD-10-CM

## 2022-01-09 DIAGNOSIS — L89899 Pressure ulcer of other site, unspecified stage: Secondary | ICD-10-CM

## 2022-01-09 DIAGNOSIS — G40A01 Absence epileptic syndrome, not intractable, with status epilepticus: Secondary | ICD-10-CM

## 2022-01-09 DIAGNOSIS — L89819 Pressure ulcer of head, unspecified stage: Secondary | ICD-10-CM

## 2022-01-09 LAB — BASIC METABOLIC PANEL
Anion gap: 9 (ref 5–15)
BUN: 17 mg/dL (ref 6–20)
CO2: 28 mmol/L (ref 22–32)
Calcium: 8.6 mg/dL — ABNORMAL LOW (ref 8.9–10.3)
Chloride: 100 mmol/L (ref 98–111)
Creatinine, Ser: 0.47 mg/dL — ABNORMAL LOW (ref 0.61–1.24)
GFR, Estimated: >60 mL/min (ref >60)
Glucose, Bld: 103 mg/dL — ABNORMAL HIGH (ref 70–99)
Potassium: 4.2 mmol/L (ref 3.5–5.1)
Sodium: 137 mmol/L (ref 135–145)

## 2022-01-09 LAB — CBC
HCT: 31.9 % — ABNORMAL LOW (ref 39.0–52.0)
Hemoglobin: 10.1 g/dL — ABNORMAL LOW (ref 13.0–17.0)
MCH: 30.3 pg (ref 26.0–34.0)
MCHC: 31.7 g/dL (ref 30.0–36.0)
MCV: 95.8 fL (ref 80.0–100.0)
Platelets: 368 10*3/uL (ref 150–400)
RBC: 3.33 MIL/uL — ABNORMAL LOW (ref 4.22–5.81)
RDW: 14.1 % (ref 11.5–15.5)
WBC: 10.4 10*3/uL (ref 4.0–10.5)
nRBC: 0 % (ref 0.0–0.2)

## 2022-01-09 LAB — GLUCOSE, CAPILLARY
Glucose-Capillary: 115 mg/dL — ABNORMAL HIGH (ref 70–99)
Glucose-Capillary: 106 mg/dL — ABNORMAL HIGH (ref 70–99)
Glucose-Capillary: 106 mg/dL — ABNORMAL HIGH (ref 70–99)
Glucose-Capillary: 130 mg/dL — ABNORMAL HIGH (ref 70–99)
Glucose-Capillary: 109 mg/dL — ABNORMAL HIGH (ref 70–99)
Glucose-Capillary: 128 mg/dL — ABNORMAL HIGH (ref 70–99)

## 2022-01-09 LAB — MAGNESIUM: Magnesium: 1.8 mg/dL (ref 1.7–2.4)

## 2022-01-09 IMAGING — DX DG CHEST 1V PORT
1 series · 1 of 1 positions shown · non-contrast
Comparison: None Available.

CLINICAL DATA: Acute respiratory failure

EXAM:
PORTABLE CHEST 1 VIEW

[chest ap]
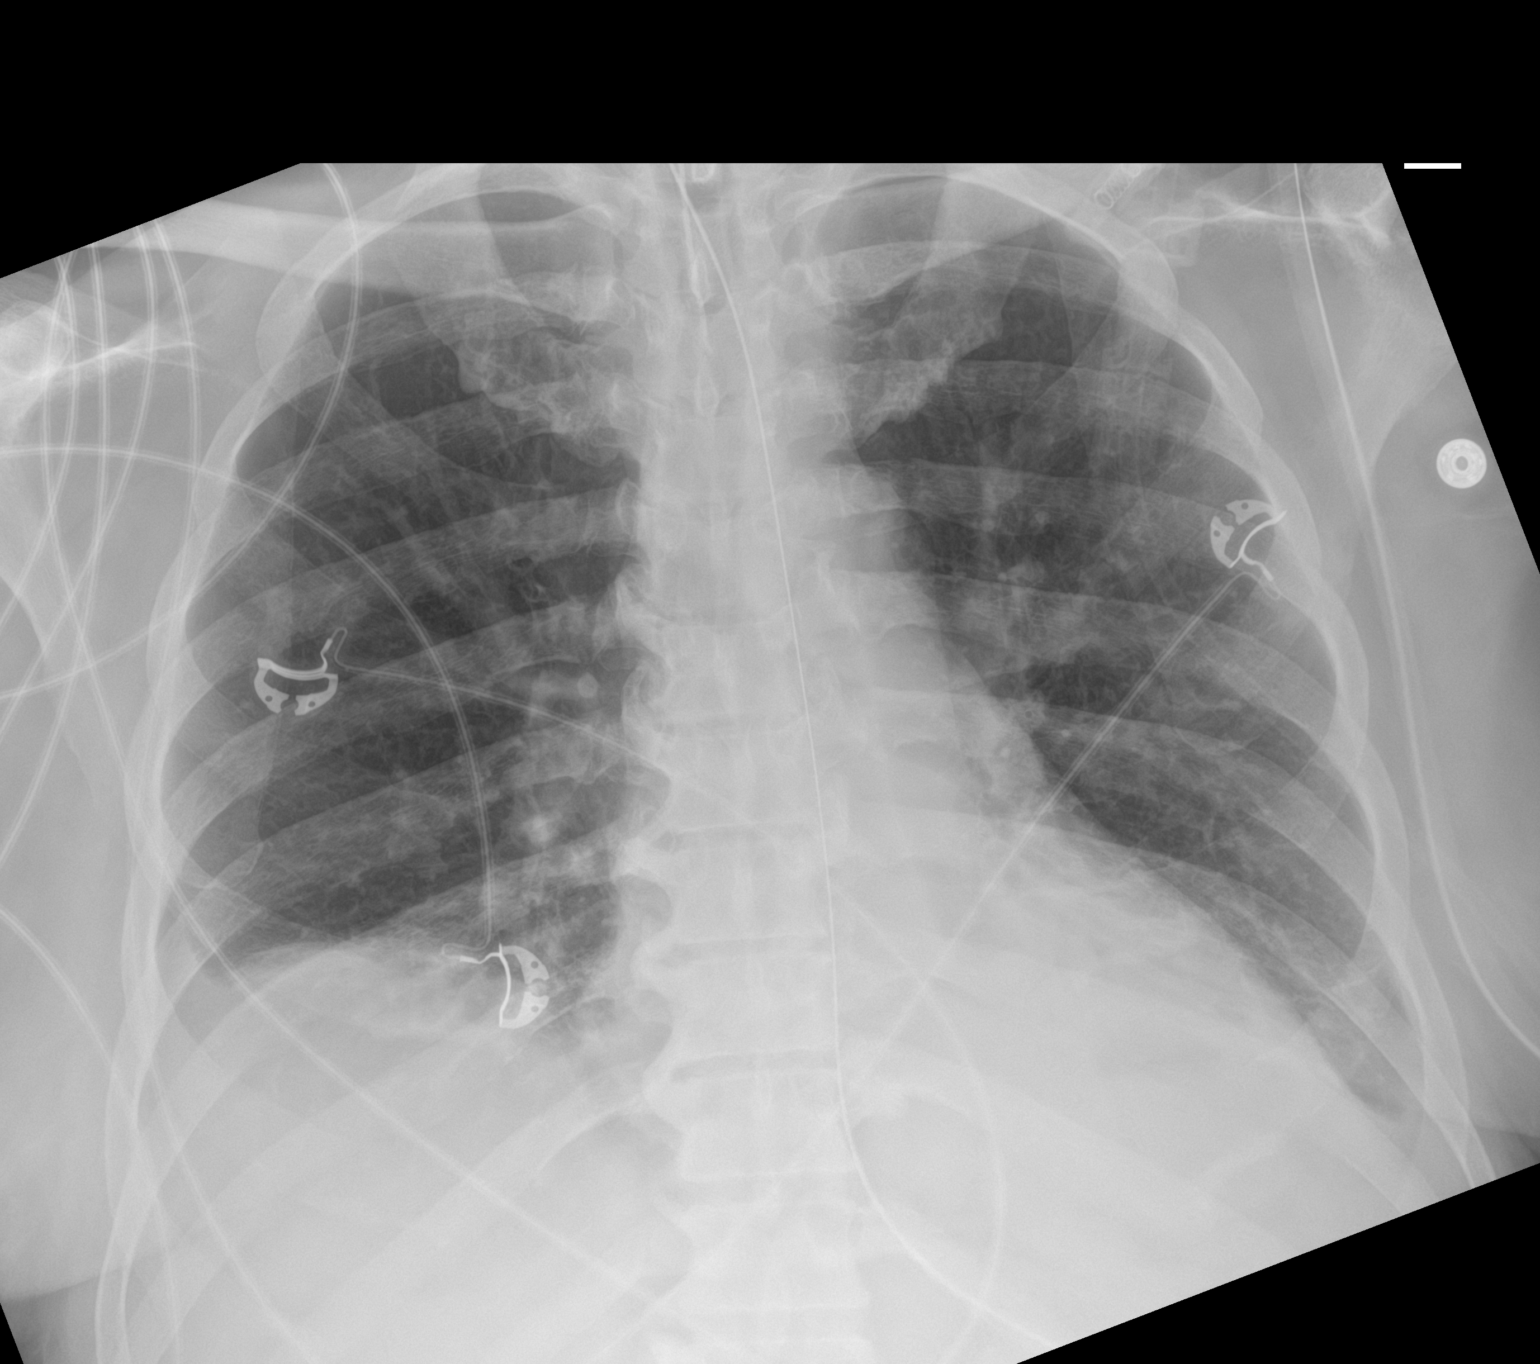

[1 of 1 positions shown; findings below may reference images not displayed]

FINDINGS: Endotracheal and NG tube unchanged. Stable cardiac silhouette. Mild
LEFT basilar atelectasis. No pneumothorax. Prominent spine
osteophytes.
IMPRESSION: Support apparatus in good position

LEFT basilar atelectasis

## 2022-01-09 IMAGING — DX DG ABD PORTABLE 1V
1 series · 1 of 1 positions shown · non-contrast
Comparison: None Available.

CLINICAL DATA: Feeding tube inserted

EXAM:
PORTABLE ABDOMEN - 1 VIEW

[abdomen]
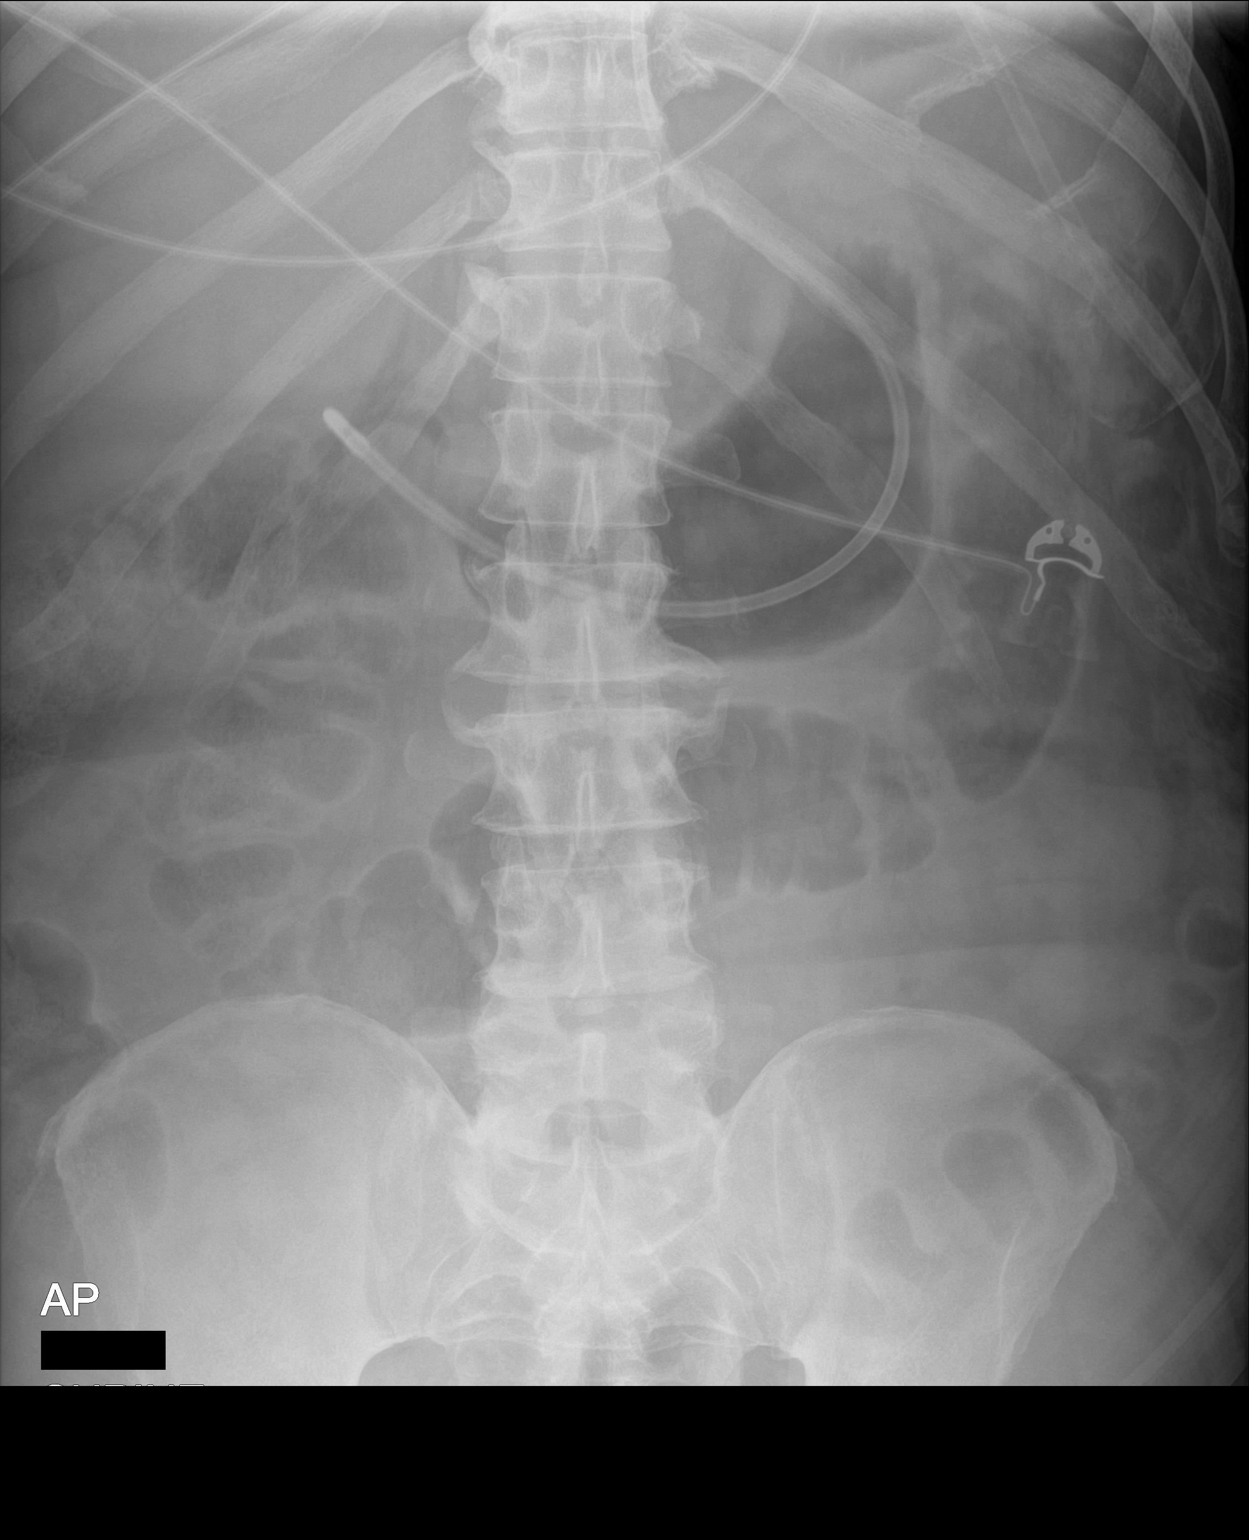

[1 of 1 positions shown; findings below may reference images not displayed]

FINDINGS: Feeding tube with tip in the first portion the duodenum.
IMPRESSION: Feeding tube with tip in the first portion duodenum. Nonobstructive
bowel gas pattern.

## 2022-01-09 MED ORDER — LEVETIRACETAM 100 MG/ML PO SOLN
1000.0000 mg | Freq: Two times a day (BID) | ORAL | Status: DC
  Administered 2022-01-09 – 2022-01-13 (×8): 1000 mg
  Filled 2022-01-09 (×8): qty 10

## 2022-01-09 MED ORDER — LACOSAMIDE 200 MG PO TABS
200.0000 mg | ORAL_TABLET | Freq: Two times a day (BID) | ORAL | Status: DC
  Administered 2022-01-09 – 2022-01-13 (×9): 200 mg
  Filled 2022-01-09 (×10): qty 1

## 2022-01-09 MED ORDER — FENTANYL CITRATE (PF) 100 MCG/2ML IJ SOLN
50.0000 ug | INTRAMUSCULAR | Status: DC | PRN
  Administered 2022-01-10 (×3): 50 ug via INTRAVENOUS
  Filled 2022-01-09 (×3): qty 2

## 2022-01-09 MED ORDER — ACETAMINOPHEN 160 MG/5ML PO SOLN
650.0000 mg | Freq: Four times a day (QID) | ORAL | Status: DC | PRN
  Administered 2022-01-09 – 2022-01-11 (×2): 650 mg
  Filled 2022-01-09 (×2): qty 20.3

## 2022-01-09 MED ORDER — MAGNESIUM SULFATE 2 GM/50ML IV SOLN
2.0000 g | Freq: Once | INTRAVENOUS | Status: AC
  Administered 2022-01-09: 2 g via INTRAVENOUS
  Filled 2022-01-09: qty 50

## 2022-01-09 NOTE — Procedures (Signed)
Cortrak  Tube Type:  Cortrak - 43 inches Tube Location:  Right nare Initial Placement:  Stomach Secured by: Bridle Technique Used to Measure Tube Placement:  Marking at nare/corner of mouth Cortrak Secured At:  80 cm   Cortrak Tube Team Note:  Consult received to place a Cortrak feeding tube.   X-ray is required, abdominal x-ray has been ordered by the Cortrak team. Please confirm tube placement before using the Cortrak tube.   If the tube becomes dislodged please keep the tube and contact the Cortrak team at [email or web address] (password [email or web address]) for replacement.  If after hours and replacement cannot be delayed, place a NG tube and confirm placement with an abdominal x-ray.    Matej Sappenfield MS, RD, LDN Please refer to AMION for RD and/or RD on-call/weekend/after hours pager   

## 2022-01-09 NOTE — Progress Notes (Signed)
? ?NAME:  Victor Erickson, MRN:  [id number], DOB:  [birth year], LOS: 15 ?ADMISSION DATE:  12/25/2021, CONSULTATION DATE:  12/25/2021 ?REFERRING MD: Dr. Karene Fry CHIEF COMPLAINT:  Found down  ? ?History of Present Illness:  ?61 year old man who presented to [facility] 4/25 after being found down. PMHx significant for HTN, HLD, seizures, EtOH abuse. Required intubation for airway protection.  Noted to be in status epilepticus, rhabdomyolysis. Family reported that he decided to quit drinking last week. ? ?PCCM consulted for ICU admission for status epilepticus/EtOH withdrawal. ? ?Pertinent Medical History:  ?HTN, HLD, seizures, EtOH abuse ? ?Significant Hospital Events: ?Including procedures, antibiotic start and stop dates in addition to other pertinent events   ?4/25 admission, CTA head/neck NAICP, small area of encephalomalacial left ant frontal lobe likely from prior trauma, fluid throughout R scalp; CT C.AP> no hematoma, GGO RUL, infiltrates bilateral lower lobes, no RP hematoma, enlarged fatty liver; intubated, LTM started ?4/28 increased vimpat and phenobarb, continue keppra and wean propofol, continue versed infusion; stop zosyn, no seizures on EEG ?4/29 Fever, WBC increased ?4/30 Waking.  Ongoing seizures on EEG ?5/1 concern for ongoing seizure on EEG ?5/3 Continued subclinical seizures on EEG. Slightly improved neuro exam, not yet following commands. ?5/4 Slight improvement in neuro exam. Awake/calm, intermittently following commands. Tolerating PSV better than PRVC. LTM EEG, last seizure 2243. Febrile to 100.14F, broad spectrum abx started (cefepime, linezolid), Resp Cx with gram neg diplococci on prelim. ?5/5 Continued improvement in neuro exam, following commands more consistently. Off sedation. No further fevers. LTM EEG in place. Tolerating PSV 12/5. C-collar removed. ?5/6 no major issues overnight, remains on ventilator this AM.  Currently tolerating pressure support but requiring 12 over PEEP ?5/7 no major issues  overnight, slight agitation on ventilator with lightened sedation. Tracks movements, follows simple commands  ? ?Interim History / Subjective:  ?Continues to tolerate daily SBT.  No further seizures. ? ?Objective:  ?Blood pressure 103/62, pulse 79, temperature 99 ?F (37.2 ?C), temperature source Oral, resp. rate 16, height 6\' 1"  (1.854 m), weight 103.9 kg, SpO2 96 %. ?   ?Vent Mode: PSV;CPAP ?FiO2 (%):  [40 %] 40 % ?Set Rate:  [16 bmp] 16 bmp ?Vt Set:  mL] 630 mL ?PEEP:  [5 cmH20] 5 cmH20 ?Pressure Support:  [8 cmH20-10 cmH20] 10 cmH20 ?Plateau Pressure:  [18 cmH20-20 cmH20] 18 cmH20  ? ?Intake/Output Summary (Last 24 hours) at 01/09/2022 0903 ?Last data filed at 01/09/2022 0700 ?Gross per 24 hour  ?Intake 2519.27 ml  ?Output 3600 ml  ?Net -1080.73 ml  ? ? ?Filed Weights  ? 01/07/22 0518 01/08/22 0145 01/09/22 0500  ?Weight: 107.3 kg 104.4 kg 103.9 kg  ? ?Physical Examination: ?General: critically ill appearing adult male lying in bed in NAD ?HEENT: MM pink/moist, ETT, upper lip edema, eyes open, pupils reactive / left irregular (chronic) ?Neuro: Awakens to voice, tracks provider, did not follow commands for me today.  CV: s1s2 RRR, SR in 90's, no m/r/g ?PULM: non-labored at rest on PSV, clear breath sounds throughout.  Strong cough.   ?GI: soft, bsx4 active  ?Extremities: warm/dry, 1+ generalized dependent edema  ?Skin: areas of pressure from pre-admit unchanged  ? ?Resolved Hospital Problem List:  ?Aspiration Pneumonia ?Hypernatremia ?Found down, wearing hard collar per protocol ?-No acute fracture on CT but has not been able to clear from soft tissue standpoint.  C-collar removed 5/5 ?Rhabdomyolysis  ?Demand Ischemia ? ?Assessment & Plan:  ? ?Non-convulsive status epilepticus (HCC) ?Presented with altered mental status having not  been seen for several days.  Difficult to control status epilepticus now resolved for 4 days.Marland Kitchen  MRI shows restricted diffusion consistent with seizures ? ?-Taper Valium, decrease  Keppra, and monitor for next 48 hours until next steady-state achieved prior to making further medication changes. ? ?Acute respiratory failure with hypoxia (HCC) ?Intubated for airway protection.  There are infiltrates on chest x-ray.  Some groundglass opacification on CT. ? ?-Daily SBT. ?-Generalized weakness likely major limiter for extubation.  May require tracheostomy. ?-Family are reluctant to proceed with tracheostomy unless strong odds that the patient will recover intact.  Ongoing discussions.  Given decent seizure control and excellent premorbid condition patient has a reasonable chance of making a full recovery. ? ?Pressure injury of skin ?Superficial injuries on right temple, hip, knee from fall/immobility.  ? ?- Topical wound care ? ?Malnutrition of moderate degree ?Continue tube feeds ? ?Best Practice (right click and "Reselect all SmartList Selections" daily)  ?Diet/type: tubefeeds ?DVT prophylaxis: prophylactic heparin  ?GI prophylaxis: PPI ?Lines: N/A ?Foley:  N/A ?Code Status:  full code ?Last date of multidisciplinary goals of care discussion: Reviewed plan of care with patients wife in detail - discussed option of trach and giving more time for patient to potentially recover.  Reviewed down stream issues that can arise with trach (feeding tube need, facility placement etc).  She indicates she feels he would not want a trach but will discuss with son.  Family planning to meet this afternoon with providers to discuss further.   ? ? ? ? ?

## 2022-01-09 NOTE — Progress Notes (Signed)
[facility] ADULT ICU REPLACEMENT PROTOCOL ? ? ?The patient does apply for the [facility] Adult ICU Electrolyte Replacment Protocol based on the criteria listed below:  ? ?1.Exclusion criteria: TCTS patients, ECMO patients, and Dialysis patients ?2. Is GFR >/= 30 ml/min? Yes.    ?Patient's GFR today is >60 ?3. Is SCr </= 2? Yes.   ?Patient's SCr is 0.47 mg/dL ?4. Did SCr increase >/= 0.5 in 24 hours? No. ?5.Pt's weight >40kg  Yes.   ?6. Abnormal electrolyte(s):   Mg 1.8  ?7. Electrolytes replaced per protocol ?8.  Call MD STAT for K+ </= 2.5, Phos </= 1, or Mag </= 1 ?Physician:  Lona Kettle ? ?Wilburt Finlay 01/09/2022 5:39 AM ? ?

## 2022-01-10 ENCOUNTER — Inpatient Hospital Stay (HOSPITAL_COMMUNITY): Payer: BC Managed Care – PPO

## 2022-01-10 DIAGNOSIS — E440 Moderate protein-calorie malnutrition: Secondary | ICD-10-CM

## 2022-01-10 DIAGNOSIS — R509 Fever, unspecified: Secondary | ICD-10-CM

## 2022-01-10 LAB — BASIC METABOLIC PANEL
Anion gap: 6 (ref 5–15)
BUN: 19 mg/dL (ref 6–20)
CO2: 30 mmol/L (ref 22–32)
Calcium: 8.7 mg/dL — ABNORMAL LOW (ref 8.9–10.3)
Chloride: 102 mmol/L (ref 98–111)
Creatinine, Ser: 0.52 mg/dL — ABNORMAL LOW (ref 0.61–1.24)
GFR, Estimated: >60 mL/min (ref >60)
Glucose, Bld: 106 mg/dL — ABNORMAL HIGH (ref 70–99)
Potassium: 4 mmol/L (ref 3.5–5.1)
Sodium: 138 mmol/L (ref 135–145)

## 2022-01-10 LAB — CBC
HCT: 30.3 % — ABNORMAL LOW (ref 39.0–52.0)
Hemoglobin: 9.8 g/dL — ABNORMAL LOW (ref 13.0–17.0)
MCH: 30.4 pg (ref 26.0–34.0)
MCHC: 32.3 g/dL (ref 30.0–36.0)
MCV: 94.1 fL (ref 80.0–100.0)
Platelets: 361 10*3/uL (ref 150–400)
RBC: 3.22 MIL/uL — ABNORMAL LOW (ref 4.22–5.81)
RDW: 14 % (ref 11.5–15.5)
WBC: 11.3 10*3/uL — ABNORMAL HIGH (ref 4.0–10.5)
nRBC: 0 % (ref 0.0–0.2)

## 2022-01-10 LAB — GLUCOSE, CAPILLARY
Glucose-Capillary: 115 mg/dL — ABNORMAL HIGH (ref 70–99)
Glucose-Capillary: 122 mg/dL — ABNORMAL HIGH (ref 70–99)
Glucose-Capillary: 98 mg/dL (ref 70–99)
Glucose-Capillary: 131 mg/dL — ABNORMAL HIGH (ref 70–99)
Glucose-Capillary: 116 mg/dL — ABNORMAL HIGH (ref 70–99)
Glucose-Capillary: 108 mg/dL — ABNORMAL HIGH (ref 70–99)

## 2022-01-10 LAB — LACTIC ACID, PLASMA
Lactic Acid, Venous: 1.3 mmol/L (ref 0.5–1.9)
Lactic Acid, Venous: 1.2 mmol/L (ref 0.5–1.9)

## 2022-01-10 LAB — HEPATIC FUNCTION PANEL
ALT: 47 U/L — ABNORMAL HIGH (ref 0–44)
AST: 46 U/L — ABNORMAL HIGH (ref 15–41)
Albumin: 1.9 g/dL — ABNORMAL LOW (ref 3.5–5.0)
Alkaline Phosphatase: 104 U/L (ref 38–126)
Bilirubin, Direct: 0.1 mg/dL (ref 0.0–0.2)
Indirect Bilirubin: 0.2 mg/dL — ABNORMAL LOW (ref 0.3–0.9)
Total Bilirubin: 0.3 mg/dL (ref 0.3–1.2)
Total Protein: 6.6 g/dL (ref 6.5–8.1)

## 2022-01-10 LAB — MAGNESIUM: Magnesium: 1.8 mg/dL (ref 1.7–2.4)

## 2022-01-10 IMAGING — DX DG CHEST 1V PORT
1 series · 1 of 1 positions shown · non-contrast
Comparison: [DATE]

CLINICAL DATA: Fever.

EXAM:
PORTABLE CHEST 1 VIEW

[chest ap]
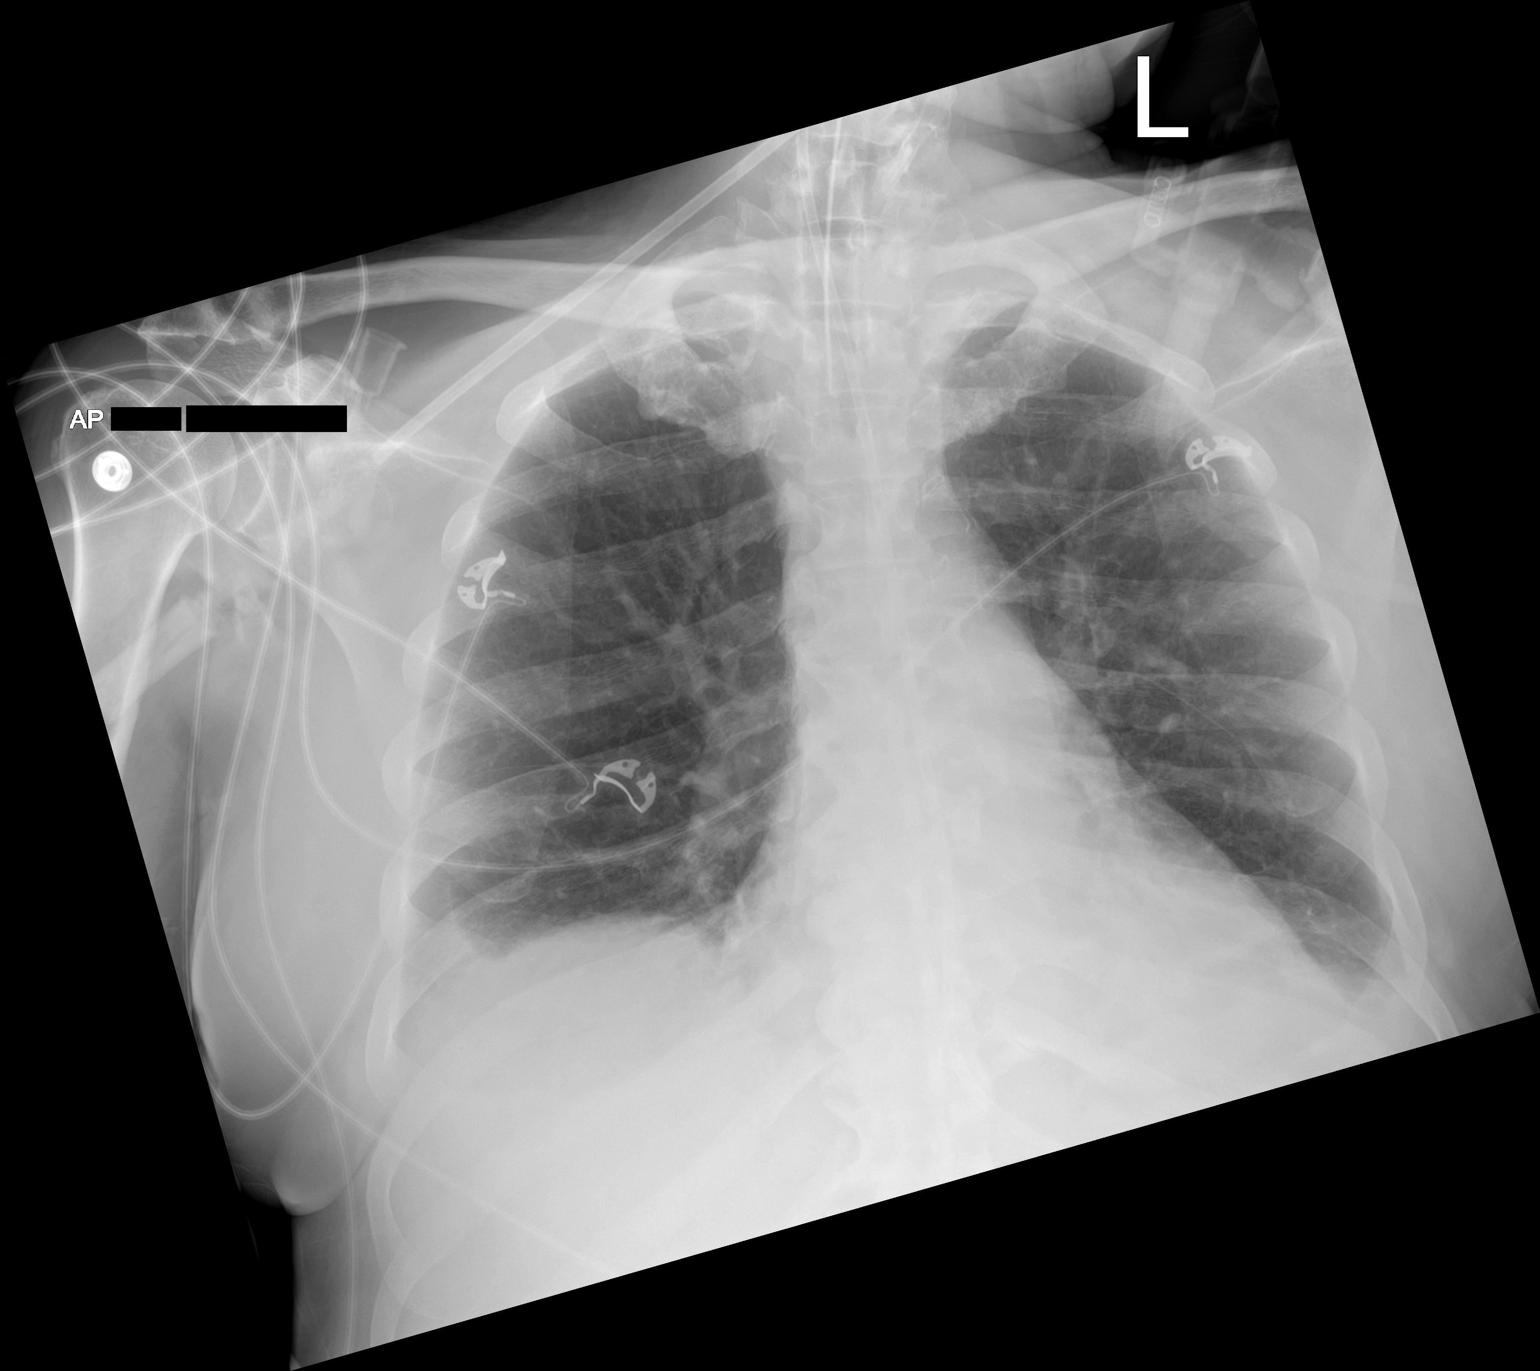

[1 of 1 positions shown; findings below may reference images not displayed]

FINDINGS: There is stable endotracheal tube positioning. The nasogastric tube
seen on the prior study has been removed and replaced with a feeding
tube. Its distal end is not included in the field of view. Mild
areas of atelectasis are seen within the bilateral lung bases. Small
bilateral pleural effusions are noted. No pneumothorax is
identified. The heart size and mediastinal contours are within
normal limits. Multilevel degenerative changes are seen throughout
the thoracic spine.
IMPRESSION: 1. Mild, stable bibasilar atelectasis.
2. Small bilateral pleural effusions.

## 2022-01-10 NOTE — Progress Notes (Signed)
? ?NAME:  Victor Erickson, MRN:  [id number], DOB:  12/18/1960, LOS: 57 ?ADMISSION DATE:  12/25/2021, CONSULTATION DATE:  12/25/2021 ?REFERRING MD: Dr. Armandina Gemma CHIEF COMPLAINT:  Found down  ? ?History of Present Illness:  ?61 year old man who presented to [facility] 4/25 after being found down. PMHx significant for HTN, HLD, seizures, EtOH abuse. Required intubation for airway protection.  Noted to be in status epilepticus, rhabdomyolysis. Family reported that he decided to quit drinking last week. ? ?PCCM consulted for ICU admission for status epilepticus/EtOH withdrawal. ? ?Pertinent Medical History:  ?HTN, HLD, seizures, EtOH abuse ? ?Significant Hospital Events: ?Including procedures, antibiotic start and stop dates in addition to other pertinent events   ?4/25 admission, CTA head/neck NAICP, small area of encephalomalacial left ant frontal lobe likely from prior trauma, fluid throughout R scalp; CT C.AP> no hematoma, GGO RUL, infiltrates bilateral lower lobes, no RP hematoma, enlarged fatty liver; intubated, LTM started ?4/28 increased vimpat and phenobarb, continue keppra and wean propofol, continue versed infusion; stop zosyn, no seizures on EEG ?4/29 Fever, WBC increased ?4/30 Waking.  Ongoing seizures on EEG ?5/1 concern for ongoing seizure on EEG ?5/3 Continued subclinical seizures on EEG. Slightly improved neuro exam, not yet following commands. ?5/4 Slight improvement in neuro exam. Awake/calm, intermittently following commands. Tolerating PSV better than PRVC. LTM EEG, last seizure 2243. Febrile to 100.86F, broad spectrum abx started (cefepime, linezolid), Resp Cx with gram neg diplococci on prelim. ?5/5 Continued improvement in neuro exam, following commands more consistently. Off sedation. No further fevers. LTM EEG in place. Tolerating PSV 12/5. C-collar removed. ?5/6 no major issues overnight, remains on ventilator this AM.  Currently tolerating pressure support but requiring 12 over PEEP ?5/7 no major issues  overnight, slight agitation on ventilator with lightened sedation. Tracks movements, follows simple commands  ? ?Interim History / Subjective:  ?Low grade fever overnight ?Tol wean PS 10/5 ? ?Objective:  ?Blood pressure (!) 99/51, pulse 76, temperature 98.3 ?F (36.8 ?C), temperature source Oral, resp. rate 17, height 6\' 1"  (1.854 m), weight 102.3 kg, SpO2 95 %. ?   ?Vent Mode: PSV;CPAP ?FiO2 (%):  [40 %] 40 % ?Set Rate:  [6 bmp-16 bmp] 16 bmp ?Vt Set:  [id number] mL] 630 mL ?PEEP:  [5 cmH20] 5 cmH20 ?Pressure Support:  [10 cmH20] 10 cmH20 ?Plateau Pressure:  [17 cmH20-25 cmH20] 18 cmH20  ? ?Intake/Output Summary (Last 24 hours) at 01/10/2022 0907 ?Last data filed at 01/10/2022 0800 ?Gross per 24 hour  ?Intake 1837.64 ml  ?Output 2125 ml  ?Net -287.36 ml  ? ?Filed Weights  ? 01/08/22 0145 01/09/22 0500 01/10/22 0500  ?Weight: 104.4 kg 103.9 kg 102.3 kg  ? ?Physical Examination: ?General: critically ill appearing adult male lying in bed in NAD ?HEENT: MM pink/moist, ETT, no JVD ?Neuro: somnolent, opens eyes to stimulation, does not follow commands, does not track ?CV: s1s2 RRR, SR in 90's, no m/r/g ?PULM: resps even non labored on PS 10/5, clear  ?GI: soft, bsx4 active  ?Extremities: warm/dry, 1+ generalized dependent edema  ?Skin: areas of pressure from pre-admit unchanged  ? ?Resolved Hospital Problem List:  ?Aspiration Pneumonia ?Hypernatremia ?Found down, wearing hard collar per protocol ?-No acute fracture on CT but has not been able to clear from soft tissue standpoint.  C-collar removed 5/5 ?Rhabdomyolysis  ?Demand Ischemia ? ?Assessment & Plan:  ? ?Non-convulsive status epilepticus ([place]) ?Presented with altered mental status having not been seen for several days.  Difficult to control status epilepticus now resolved for days.  MRI shows restricted diffusion consistent with seizures ? ?-Taper Valium to off 5/12, decrease Keppra, and monitor for next 48 hours until next steady-state achieved prior to making further  medication changes. ? ?Acute respiratory failure with hypoxia ([place]) ?Intubated for airway protection.  Some groundglass opacification on CT. ? ?-continue Daily SBT, PS wean as tol. ?-Generalized weakness and AMS likely major limiter for extubation.   ?-Family are reluctant to proceed with tracheostomy unless strong odds that the patient will recover intact.  Ongoing discussions.   ? ?Pressure injury of skin ?Superficial injuries on right temple, hip, knee from fall/immobility.  ? ?- Topical wound care ? ?Malnutrition of moderate degree ?Continue tube feeds ? ?Fever  ?PLAN -  ?BC pending - neg to date  ?Trend WBC  ? ?Best Practice (right click and "Reselect all SmartList Selections" daily)  ?Diet/type: tubefeeds ?DVT prophylaxis: prophylactic heparin  ?GI prophylaxis: PPI ?Lines: N/A ?Foley:  N/A ?Code Status:  full code ? ?Last date of multidisciplinary goals of care discussion: Reviewed plan of care with patients wife in detail - discussed option of trach and giving more time for patient to potentially recover.  Reviewed down stream issues that can arise with trach (feeding tube need, facility placement etc).  She indicates she feels he would not want a trach but will discuss with son.  Family planning to meet this afternoon with providers to discuss further.   ? ? ? ?Nickolas Madrid, NP ?Pulmonary/Critical Care Medicine  ?01/10/2022  9:07 AM ? ? ?

## 2022-01-11 LAB — PHENOBARBITAL LEVEL: Phenobarbital: 16 ug/mL (ref 15.0–40.0)

## 2022-01-11 LAB — GLUCOSE, CAPILLARY
Glucose-Capillary: 93 mg/dL (ref 70–99)
Glucose-Capillary: 104 mg/dL — ABNORMAL HIGH (ref 70–99)
Glucose-Capillary: 140 mg/dL — ABNORMAL HIGH (ref 70–99)
Glucose-Capillary: 117 mg/dL — ABNORMAL HIGH (ref 70–99)
Glucose-Capillary: 98 mg/dL (ref 70–99)
Glucose-Capillary: 90 mg/dL (ref 70–99)

## 2022-01-11 MED ORDER — ORAL CARE MOUTH RINSE
15.0000 mL | Freq: Two times a day (BID) | OROMUCOSAL | Status: DC
  Administered 2022-01-11 – 2022-01-13 (×4): 15 mL via OROMUCOSAL

## 2022-01-11 MED ORDER — GUAIFENESIN 100 MG/5ML PO LIQD
5.0000 mL | ORAL | Status: DC | PRN
  Administered 2022-01-11: 5 mL
  Filled 2022-01-11: qty 10

## 2022-01-11 MED ORDER — DEXAMETHASONE SODIUM PHOSPHATE 10 MG/ML IJ SOLN
10.0000 mg | Freq: Once | INTRAMUSCULAR | Status: AC
  Administered 2022-01-11: 10 mg via INTRAVENOUS
  Filled 2022-01-11: qty 1

## 2022-01-11 MED ORDER — PHENOBARBITAL 32.4 MG PO TABS
97.2000 mg | ORAL_TABLET | Freq: Every day | ORAL | Status: DC
Start: 2022-01-12 — End: 2022-01-13
  Administered 2022-01-12 – 2022-01-13 (×2): 97.2 mg
  Filled 2022-01-11 (×2): qty 3

## 2022-01-11 NOTE — Procedures (Signed)
Extubation Procedure Note ? ?Patient Details:   ?Name: Victor Erickson ?DOB: 12-Aug-1961 ?MRN: [id number] ?  ?Airway Documentation:  ?  ?Vent end date: 01/11/22 Vent end time: 1424  ? ?Evaluation ? O2 sats: stable throughout ?Complications: No apparent complications ?Patient did tolerate procedure well. ?Bilateral Breath Sounds: Clear, Diminished ?  ?No ? ?Patient tolerated wean. Positive for cuff leak. MD ordered to extubate. Patient extubated to a 4 Lpm nasal cannula. No signs of dyspnea or stridor noted. Patient resting comfortably. RN at bedside.  ? ?Ancil Boozer ?01/11/2022, 2:32 PM ? ?

## 2022-01-11 NOTE — Progress Notes (Signed)
? ?NAME:  Victor Erickson, MRN:  [id number], DOB:  [birth year], LOS: 81 ?ADMISSION DATE:  12/25/2021, CONSULTATION DATE:  12/25/2021 ?REFERRING MD: Dr. Armandina Gemma CHIEF COMPLAINT:  Found down  ? ?History of Present Illness:  ?61 year old man who presented to [facility] 4/25 after being found down. PMHx significant for HTN, HLD, seizures, EtOH abuse. Required intubation for airway protection.  Noted to be in status epilepticus, rhabdomyolysis. Family reported that he decided to quit drinking last week. ? ?PCCM consulted for ICU admission for status epilepticus/EtOH withdrawal. ? ?Pertinent Medical History:  ?HTN, HLD, seizures, EtOH abuse ? ?Significant Hospital Events: ?Including procedures, antibiotic start and stop dates in addition to other pertinent events   ?4/25 admission, CTA head/neck NAICP, small area of encephalomalacial left ant frontal lobe likely from prior trauma, fluid throughout R scalp; CT C.AP> no hematoma, GGO RUL, infiltrates bilateral lower lobes, no RP hematoma, enlarged fatty liver; intubated, LTM started ?4/28 increased vimpat and phenobarb, continue keppra and wean propofol, continue versed infusion; stop zosyn, no seizures on EEG ?4/29 Fever, WBC increased ?4/30 Waking.  Ongoing seizures on EEG ?5/1 concern for ongoing seizure on EEG ?5/3 Continued subclinical seizures on EEG. Slightly improved neuro exam, not yet following commands. ?5/4 Slight improvement in neuro exam. Awake/calm, intermittently following commands. Tolerating PSV better than PRVC. LTM EEG, last seizure 2243. Febrile to 100.82F, broad spectrum abx started (cefepime, linezolid), Resp Cx with gram neg diplococci on prelim. ?5/5 Continued improvement in neuro exam, following commands more consistently. Off sedation. No further fevers. LTM EEG in place. Tolerating PSV 12/5. C-collar removed. ?5/6 no major issues overnight, remains on ventilator this AM.  Currently tolerating pressure support but requiring 12 over PEEP ?5/7 no major issues  overnight, slight agitation on ventilator with lightened sedation. Tracks movements, follows simple commands  ? ?Interim History / Subjective:  ?Low grade fever overnight ?Tol wean PS 10/5 ? ?Objective:  ?Blood pressure (!) 96/59, pulse 67, temperature 98.7 ?F (37.1 ?C), temperature source Oral, resp. rate 14, height 6\' 1"  (1.854 m), weight 102.4 kg, SpO2 96 %. ?   ?Vent Mode: PSV;CPAP ?FiO2 (%):  [40 %] 40 % ?PEEP:  [5 cmH20] 5 cmH20 ?Pressure Support:  [10 cmH20] 10 cmH20 ?Plateau Pressure:  [14 cmH20-15 cmH20] 15 cmH20  ? ?Intake/Output Summary (Last 24 hours) at 01/11/2022 0807 ?Last data filed at 01/11/2022 0600 ?Gross per 24 hour  ?Intake 1760 ml  ?Output 1201 ml  ?Net 559 ml  ? ? ?Filed Weights  ? 01/09/22 0500 01/10/22 0500 01/11/22 0500  ?Weight: 103.9 kg 102.3 kg 102.4 kg  ? ?Physical Examination: ?General: critically ill appearing adult male lying in bed in NAD ?HEENT: MM pink/moist, ETT, no JVD ?Neuro: somnolent, opens eyes to stimulation, does not follow commands, does not track ?CV: s1s2 RRR, SR in 90's, no m/r/g ?PULM: resps even non labored on PS 10/5, clear  ?GI: soft, bsx4 active  ?Extremities: warm/dry, 1+ generalized dependent edema  ?Skin: areas of pressure from pre-admit unchanged  ? ?Resolved Hospital Problem List:  ?Aspiration Pneumonia ?Hypernatremia ?Found down, wearing hard collar per protocol ?-No acute fracture on CT but has not been able to clear from soft tissue standpoint.  C-collar removed 5/5 ?Rhabdomyolysis  ?Demand Ischemia ? ?Assessment & Plan:  ? ?Non-convulsive status epilepticus ([place]) ?Presented with altered mental status having not been seen for several days.  Difficult to control status epilepticus now resolved for days.  MRI shows restricted diffusion consistent with seizures ?Mental status has improved some with medication  reduction.  ? ?- Appears to have reached new steady -state and seems alert enough to protect airway.  May be at his best for attempt at extubation.   ? ?Acute respiratory failure with hypoxia ([place]) ?Intubated for airway protection.  Some groundglass opacification on CT. ? ?-Tolerating open-ended SBT. Lung mechanics adequate for extubation.  ?- Alert with adequate cough.  ?- Dexamethasone for possible upper airway edema, then one-way extubation.  ? ?Pressure injury of skin ?Superficial injuries on right temple, hip, knee from fall/immobility.  ? ?- Topical wound care ? ?Malnutrition of moderate degree ?Continue tube feeds ? ?Best Practice (right click and "Reselect all SmartList Selections" daily)  ?Diet/type: tubefeeds ?DVT prophylaxis: prophylactic heparin  ?GI prophylaxis: PPI ?Lines: N/A ?Foley:  N/A ?Code Status:  full code ? ?Last date of multidisciplinary goals of care discussion: Reviewed plan of care with patients wife in detail - discussed option of trach and giving more time for patient to potentially recover.  Reviewed down stream issues that can arise with trach (feeding tube need, facility placement etc).  She indicates she feels he would not want a trach but will discuss with son.  Family planning to meet this afternoon with providers to discuss further.   ? ?CRITICAL CARE ?Performed by: Kipp Brood ? ? ?Total critical care time: 35 minutes ? ?Critical care time was exclusive of separately billable procedures and treating other patients. ? ?Critical care was necessary to treat or prevent imminent or life-threatening deterioration. ? ?Critical care was time spent personally by me on the following activities: development of treatment plan with patient and/or surrogate as well as nursing, discussions with consultants, evaluation of patient's response to treatment, examination of patient, obtaining history from patient or surrogate, ordering and performing treatments and interventions, ordering and review of laboratory studies, ordering and review of radiographic studies, pulse oximetry, re-evaluation of patient's condition and participation in  multidisciplinary rounds. ? ?Kipp Brood, MD FRCPC ?ICU Physician ?[place]  ?Pager: [phone] ?Mobile: [phone] ?After hours: [phone]. ? ?01/11/2022  8:07 AM ? ? ?

## 2022-01-11 NOTE — Progress Notes (Signed)
Subjective: NAEO. Family at bedside. Plan for one way extubation today ? ?ROS: Unable to obtain due to poor mental status ? ?Examination ? ?Vital signs in last 24 hours: ?Temp:  [98.1 ?F (36.7 ?C)-98.7 ?F (37.1 ?C)] 98.7 ?F (37.1 ?C) (05/12 [id number]) ?Pulse Rate:  [67-90] 67 (05/12 0700) ?Resp:  [12-20] 14 (05/12 0700) ?BP: (88-113)/(50-67) 96/59 (05/12 0700) ?SpO2:  [93 %-100 %] 96 % (05/12 0739) ?FiO2 (%):  [40 %] 40 % (05/12 0739) ?Weight:  [102.4 kg] 102.4 kg (05/12 0500) ? ?General: lying in bed, NAD ?RS: intubated ?Neuro: Opens eyes to noxious stimulation, didn't follows commands PERRLA, no gaze deviation, blinks to threat BL, withdraws to noxious stimuli in all 4 extremities L>R ? ?Basic Metabolic Panel: ?Recent Labs  ?Lab 01/05/22 ?[id number] 01/06/22 ?[id number] 01/07/22 ?[id number] 01/07/22 ?[id number] 01/08/22 ?[id number] 01/09/22 ?[id number] 01/10/22 ?[id number]  ?NA 137   < > 136 137 137 137 138  ?K 4.2   < > 5.6* 4.0 4.1 4.2 4.0  ?CL 105   < > 99 98 101 100 102  ?CO2 25   < > 27 29 27 28 30   ?GLUCOSE 109*   < > 118* 117* 116* 103* 106*  ?BUN 12   < > 17 16 17 17 19   ?CREATININE 0.53*   < > 0.52* 0.55* 0.47* 0.47* 0.52*  ?CALCIUM 8.1*   < > 8.4* 8.2* 8.1* 8.6* 8.7*  ?MG 1.7  --  1.7  --  1.7 1.8 1.8  ?PHOS 2.8  --  2.7  --   --   --   --   ? < > = values in this interval not displayed.  ? ? ?CBC: ?Recent Labs  ?Lab 01/05/22 ?0348 01/07/22 ?0224 01/08/22 ?0413 01/08/22 ?[id number] 01/08/22 ?1025 01/08/22 ?1412 01/09/22 ?0220 01/10/22 ?[id number]  ?WBC 12.1* 11.3* 10.3  --   --   --  10.4 11.3*  ?HGB 9.2* 10.6* 9.3* 9.5* 10.1* 9.6* 10.1* 9.8*  ?HCT 29.0* 33.0* 28.7* 28.9* 31.3* 29.6* 31.9* 30.3*  ?MCV 95.7 94.0 94.4  --   --   --  95.8 94.1  ?PLT 308 PLATELET CLUMPS NOTED ON SMEAR, UNABLE TO ESTIMATE 369  --   --   --  368 361  ? ? ? ?Coagulation Studies: ?No results for input(s): LABPROT, INR in the last 72 hours. ? ?Imaging ?No new brain imaging overnight ?  ?ASSESSMENT AND PLAN:  61 year old male who was found down and noted to have seizures arising from the  left hemisphere. ?  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 ?  ?Recommendations ?-Continue IV perampanel to 4 mg nightly, Keppra 1000 mg twice daily, Vimpat 200 mg twice daily, phenobarbital 97.5 mg twice daily daily ?- Valium tapered off to minimize sedation ?- One way extubation today. If successful, will reassess on Monday. We have reduced AEDs significantly this week. Might benefit from eeg next week to assess for seizure recurrence ?-Continue seizure precautions ?-Management of rest of comorbidities per primary team ?- Discussed plan with family at bedside ?  ?I have spent a total of 26 minutes with the patient reviewing hospital notes,  test results, labs and examining the patient as well as establishing an assessment and plan. > 50% of time was spent in direct patient care. ?  ?Zeb Comfort ?Epilepsy ?[facility] ?For questions after 5pm please refer to AMION to reach the Neurologist on call ? ?

## 2022-01-12 DIAGNOSIS — R569 Unspecified convulsions: Secondary | ICD-10-CM

## 2022-01-12 DIAGNOSIS — Z7189 Other specified counseling: Secondary | ICD-10-CM

## 2022-01-12 LAB — GLUCOSE, CAPILLARY
Glucose-Capillary: 112 mg/dL — ABNORMAL HIGH (ref 70–99)
Glucose-Capillary: 101 mg/dL — ABNORMAL HIGH (ref 70–99)
Glucose-Capillary: 101 mg/dL — ABNORMAL HIGH (ref 70–99)
Glucose-Capillary: 123 mg/dL — ABNORMAL HIGH (ref 70–99)
Glucose-Capillary: 114 mg/dL — ABNORMAL HIGH (ref 70–99)
Glucose-Capillary: 110 mg/dL — ABNORMAL HIGH (ref 70–99)

## 2022-01-12 MED ORDER — SCOPOLAMINE 1 MG/3DAYS TD PT72
1.0000 | MEDICATED_PATCH | TRANSDERMAL | Status: DC
  Administered 2022-01-12 – 2022-01-15 (×2): 1.5 mg via TRANSDERMAL
  Filled 2022-01-12 (×2): qty 1

## 2022-01-12 NOTE — Progress Notes (Signed)
Elink made aware that pt BP low.  Will continue to monitor. ?

## 2022-01-12 NOTE — Consult Note (Signed)
? ?                                                                                ?Consultation Note ?Date: 01/12/2022  ? ?Patient Name: Victor Erickson  ?DOB: July 15, 1961  MRN: [id number]  Age / Sex: 61 y.o., male  ?[facility]P: [facility] ?Referring Physician: Lynnell Catalan, MD ? ?Reason for Consultation:  "poor recovery following status epilepticus" ? ?HPI/Patient Profile: 61 y.o. male  with past medical history of hypertension, seizures (noncompliant with home AEDs), ETOH use, admitted on 12/25/2021 after being found down, unresponsive at home. Required intubation in ED for airway protection. He had ongoing seizures during admission- concern for ETOH withdrawal. Seizures have resolved. Scans indicate chronic encephalomalacia. He has been extubated but is having difficulty controlling secretions. His mental status continues to be altered. Palliative consulted for poor recovery following status epilepticus.  ? ?Primary Decision Maker ?NEXT OF KIN - spouse Herby Amick ? ?Discussion: ?I have reviewed medical records including Care Everywhere, progress notes from this and prior admissions, labs and imaging, discussed with RN. ? ?On evaluation patient is awake, he can barely mouth his name. He tracks somewhat. Nods yes to all questions without differentiation. He is unable to lift his hand or follow any commands.  ? ?Discussed with RN- family requesting assistance with further care planning conversations.  ? ?I called his spouse- Victor Erickson. They are separated per chart review. Patient was living independently at home alone prior to this incident. Victor Erickson is looking for guidance regarding next steps in patient's care. Patient also has a son who would like to be involved.  ? ?I discussed the need to delineate goals of care based on patient's values. Victor Erickson is in agreement. She would like to meet in person tomorrow afternoon.  ? ?SUMMARY OF RECOMMENDATIONS ?-Continue current plan of care- patient is at high risk of  decompensation due to aspiration of secretions and failure to thrive, family is facing decisions regarding PEG tube (previously expressed he would not want), SNF vs transition to comfort/hospice ?-PMT will arrange in person meeting with spouse Victor Erickson tentatively scheduled for tomorrow- Victor Erickson requests to meet after 2pm  ? ?Code Status/Advance Care Planning: ?DNR ? ? ?Prognosis:   ?Unable to determine ? ?Discharge Planning: To Be Determined ? ?Primary Diagnoses: ?Present on Admission: ? (Resolved) Septic shock (HCC) ? Non-convulsive status epilepticus (HCC) ? Acute respiratory failure with hypoxia (HCC) ? (Resolved) Myocardial injury ? (Resolved) AKI (acute kidney injury) (HCC) ? Steatohepatitis, alcoholic ? Pressure injury of skin ? Malnutrition of moderate degree ? ? ?Review of Systems  ?Unable to perform ROS: Mental status change  ?Genitourinary:  Enuresis: mental acuity.  ? ?Physical Exam ?Vitals and nursing note reviewed.  ?Constitutional:   ?   Appearance: He is ill-appearing.  ?Neurological:  ?   Comments: Unable to assess orientation  ? ? ?Vital Signs: BP (!) 105/58   Pulse 81   Temp 98.3 ?F (36.8 ?C) (Oral)   Resp 19   Ht 6\' 1"  (1.854 m)   Wt 102.4 kg   SpO2 94%   BMI 29.78 kg/m?  ?Pain Scale: CPOT ?POSS *See Group Information*: 1-Acceptable,Awake and alert ?Pain Score: 0-No pain ? ? ?  SpO2: SpO2: 94 % ?O2 Device:SpO2: 94 % ?O2 Flow Rate: .O2 Flow Rate (L/min): 6 L/min ? ?IO: Intake/output summary:  ?Intake/Output Summary (Last 24 hours) at 01/12/2022 1523 ?Last data filed at 01/12/2022 1400 ?Gross per 24 hour  ?Intake 2101 ml  ?Output 1240 ml  ?Net 861 ml  ? ? ?LBM: Last BM Date : 01/12/22 ?Baseline Weight: Weight: 110 kg ?Most recent weight: Weight: 102.4 kg     ? ? ?Thank you for this consult. Palliative medicine will continue to follow and assist as needed.  ?Greater than 50%  of this time was spent counseling and coordinating care related to the above assessment and plan. ? ?Signed by: ?Ocie Bob,  AGNP-C ?Palliative Medicine ? ?  ?Please contact Palliative Medicine Team phone at [phone] for questions and concerns.  ?For individual provider: See Amion ? ? ? ? ? ? ? ? ? ? ? ? ? ? ?

## 2022-01-12 NOTE — Progress Notes (Signed)
? ?NAME:  Victor Erickson, MRN:  [id number], DOB:  Dec 09, 1960, LOS: 18 ?ADMISSION DATE:  12/25/2021, CONSULTATION DATE:  12/25/2021 ?REFERRING MD: Dr. Karene Fry CHIEF COMPLAINT:  Found down  ? ?History of Present Illness:  ?61 year old man who presented to [facility] 4/25 after being found down. PMHx significant for HTN, HLD, seizures, EtOH abuse. Required intubation for airway protection.  Noted to be in status epilepticus, rhabdomyolysis. Family reported that he decided to quit drinking last week. ? ?PCCM consulted for ICU admission for status epilepticus/EtOH withdrawal. ? ?Pertinent Medical History:  ?HTN, HLD, seizures, EtOH abuse ? ?Significant Hospital Events: ?Including procedures, antibiotic start and stop dates in addition to other pertinent events   ?4/25 admission, CTA head/neck NAICP, small area of encephalomalacial left ant frontal lobe likely from prior trauma, fluid throughout R scalp; CT C.AP> no hematoma, GGO RUL, infiltrates bilateral lower lobes, no RP hematoma, enlarged fatty liver; intubated, LTM started ?4/28 increased vimpat and phenobarb, continue keppra and wean propofol, continue versed infusion; stop zosyn, no seizures on EEG ?4/29 Fever, WBC increased ?4/30 Waking.  Ongoing seizures on EEG ?5/1 concern for ongoing seizure on EEG ?5/3 Continued subclinical seizures on EEG. Slightly improved neuro exam, not yet following commands. ?5/4 Slight improvement in neuro exam. Awake/calm, intermittently following commands. Tolerating PSV better than PRVC. LTM EEG, last seizure 2243. Febrile to 100.90F, broad spectrum abx started (cefepime, linezolid), Resp Cx with gram neg diplococci on prelim. ?5/5 Continued improvement in neuro exam, following commands more consistently. Off sedation. No further fevers. LTM EEG in place. Tolerating PSV 12/5. C-collar removed. ?5/6 no major issues overnight, remains on ventilator this AM.  Currently tolerating pressure support but requiring 12 over PEEP ?5/7 no major issues  overnight, slight agitation on ventilator with lightened sedation. Tracks movements, follows simple commands  ?5/12 tolerated one-way extubation per prior discussions with family. ? ?Interim History / Subjective:  ?Extubated yesterday. Continues to have difficulty handling secretions, requiring frequent suctioning.  ? ?Objective:  ?Blood pressure 95/66, pulse 81, temperature 98.3 ?F (36.8 ?C), temperature source Oral, resp. rate 18, height 6\' 1"  (1.854 m), weight 102.4 kg, SpO2 95 %. ?   ?FiO2 (%):  [40 %-95 %] 95 %  ? ?Intake/Output Summary (Last 24 hours) at 01/12/2022 1235 ?Last data filed at 01/12/2022 1000 ?Gross per 24 hour  ?Intake 2081 ml  ?Output 1240 ml  ?Net 841 ml  ? ? ?Filed Weights  ? 01/09/22 0500 01/10/22 0500 01/11/22 0500  ?Weight: 103.9 kg 102.3 kg 102.4 kg  ? ?Physical Examination: ?General: critically ill appearing adult male lying in bed in NAD ?HEENT: MM pink/moist, ETT, no JVD ?Neuro: somnolent, opens eyes to stimulation, does not follow commands, does not track ?CV: s1s2 RRR, SR in 90's, no m/r/g ?PULM: clear chest, presently no secretions.  ?GI: soft, bsx4 active  ?Extremities: warm/dry, 1+ generalized dependent edema  ?Skin: areas of pressure from pre-admit unchanged  ? ?Resolved Hospital Problem List:  ?Aspiration Pneumonia ?Hypernatremia ?Found down, wearing hard collar per protocol ?-No acute fracture on CT but has not been able to clear from soft tissue standpoint.  C-collar removed 5/5 ?Rhabdomyolysis  ?Demand Ischemia ? ?Assessment & Plan:  ? ?Non-convulsive status epilepticus (HCC) ?Presented with altered mental status having not been seen for several days.  Difficult to control status epilepticus now resolved for days.  MRI shows restricted diffusion consistent with seizures ?Mental status has improved some with medication reduction.  ? ?- Appears to have reached new steady -state and seems alert  enough to protect airway.  ?- Any further improvement may be slow. Unclear if strength  will return on right side.  ? ?Acute respiratory failure with hypoxia (HCC) ?Intubated for airway protection.  Some groundglass opacification on CT. ? ?- Tolerating extubation.  ?- Continue suctioning and chest PT ?- Scopolamine patch to decrease secretions.  ? ?Pressure injury of skin ?Superficial injuries on right temple, hip, knee from fall/immobility.  ? ?- Topical wound care ? ?Malnutrition of moderate degree ?Continue tube feeds ? ?Best Practice (right click and "Reselect all SmartList Selections" daily)  ?Diet/type: tubefeeds ?DVT prophylaxis: prophylactic heparin  ?GI prophylaxis: PPI ?Lines: N/A ?Foley:  N/A ?Code Status:  full code ? ?Last date of multidisciplinary goals of care discussion: Patient family enquiring about hospice. Palliative care consulted.  ?Will keep in ICU one more day due to secretion management.  ? ?Lynnell Catalan, MD FRCPC ?ICU Physician ?[facility]  ?Pager: [phone] ?Mobile: [phone] ?After hours: [phone]. ? ?01/12/2022  12:35 PM ? ? ?

## 2022-01-12 NOTE — Plan of Care (Signed)

## 2022-01-13 DIAGNOSIS — G40901 Epilepsy, unspecified, not intractable, with status epilepticus: Secondary | ICD-10-CM | POA: Diagnosis not present

## 2022-01-13 DIAGNOSIS — Z515 Encounter for palliative care: Secondary | ICD-10-CM

## 2022-01-13 DIAGNOSIS — R6521 Severe sepsis with septic shock: Secondary | ICD-10-CM

## 2022-01-13 DIAGNOSIS — J9600 Acute respiratory failure, unspecified whether with hypoxia or hypercapnia: Secondary | ICD-10-CM

## 2022-01-13 DIAGNOSIS — A419 Sepsis, unspecified organism: Secondary | ICD-10-CM | POA: Diagnosis not present

## 2022-01-13 LAB — BASIC METABOLIC PANEL
Anion gap: 7 (ref 5–15)
BUN: 18 mg/dL (ref 6–20)
CO2: 31 mmol/L (ref 22–32)
Calcium: 8.7 mg/dL — ABNORMAL LOW (ref 8.9–10.3)
Chloride: 100 mmol/L (ref 98–111)
Creatinine, Ser: 0.44 mg/dL — ABNORMAL LOW (ref 0.61–1.24)
GFR, Estimated: >60 mL/min (ref >60)
Glucose, Bld: 117 mg/dL — ABNORMAL HIGH (ref 70–99)
Potassium: 4 mmol/L (ref 3.5–5.1)
Sodium: 138 mmol/L (ref 135–145)

## 2022-01-13 LAB — CBC
HCT: 29.8 % — ABNORMAL LOW (ref 39.0–52.0)
Hemoglobin: 9.7 g/dL — ABNORMAL LOW (ref 13.0–17.0)
MCH: 30.5 pg (ref 26.0–34.0)
MCHC: 32.6 g/dL (ref 30.0–36.0)
MCV: 93.7 fL (ref 80.0–100.0)
Platelets: 390 10*3/uL (ref 150–400)
RBC: 3.18 MIL/uL — ABNORMAL LOW (ref 4.22–5.81)
RDW: 13.8 % (ref 11.5–15.5)
WBC: 11.5 10*3/uL — ABNORMAL HIGH (ref 4.0–10.5)
nRBC: 0 % (ref 0.0–0.2)

## 2022-01-13 LAB — GLUCOSE, CAPILLARY
Glucose-Capillary: 111 mg/dL — ABNORMAL HIGH (ref 70–99)
Glucose-Capillary: 110 mg/dL — ABNORMAL HIGH (ref 70–99)
Glucose-Capillary: 134 mg/dL — ABNORMAL HIGH (ref 70–99)

## 2022-01-13 MED ORDER — GLYCOPYRROLATE 1 MG PO TABS
1.0000 mg | ORAL_TABLET | ORAL | Status: DC | PRN
  Filled 2022-01-13: qty 1

## 2022-01-13 MED ORDER — LORAZEPAM 2 MG/ML IJ SOLN: 2.0000 mg | INTRAMUSCULAR | Status: DC | PRN

## 2022-01-13 MED ORDER — ONDANSETRON HCL 4 MG/2ML IJ SOLN: 4.0000 mg | Freq: Four times a day (QID) | INTRAMUSCULAR | Status: DC | PRN

## 2022-01-13 MED ORDER — HALOPERIDOL LACTATE 2 MG/ML PO CONC: 0.5000 mg | ORAL | Status: DC | PRN

## 2022-01-13 MED ORDER — GLYCOPYRROLATE 0.2 MG/ML IJ SOLN: 0.2000 mg | INTRAMUSCULAR | Status: DC | PRN

## 2022-01-13 MED ORDER — HALOPERIDOL LACTATE 5 MG/ML IJ SOLN: 0.5000 mg | INTRAMUSCULAR | Status: DC | PRN

## 2022-01-13 MED ORDER — HALOPERIDOL 0.5 MG PO TABS
0.5000 mg | ORAL_TABLET | ORAL | Status: DC | PRN
  Filled 2022-01-13: qty 1

## 2022-01-13 MED ORDER — ACETAMINOPHEN 650 MG RE SUPP: 650.0000 mg | Freq: Four times a day (QID) | RECTAL | Status: DC | PRN

## 2022-01-13 MED ORDER — GLYCOPYRROLATE 0.2 MG/ML IJ SOLN
0.2000 mg | INTRAMUSCULAR | Status: DC | PRN
  Administered 2022-01-14 – 2022-01-15 (×2): 0.2 mg via INTRAVENOUS
  Filled 2022-01-13 (×2): qty 1

## 2022-01-13 MED ORDER — ACETAMINOPHEN 325 MG PO TABS: 650.0000 mg | ORAL_TABLET | Freq: Four times a day (QID) | ORAL | Status: DC | PRN

## 2022-01-13 MED ORDER — LEVETIRACETAM IN NACL 1000 MG/100ML IV SOLN
1000.0000 mg | Freq: Two times a day (BID) | INTRAVENOUS | Status: DC
  Administered 2022-01-13: 1000 mg via INTRAVENOUS
  Filled 2022-01-13 (×2): qty 100

## 2022-01-13 MED ORDER — BIOTENE DRY MOUTH MT LIQD: 15.0000 mL | OROMUCOSAL | Status: DC | PRN

## 2022-01-13 MED ORDER — MORPHINE SULFATE (PF) 2 MG/ML IV SOLN: 2.0000 mg | INTRAVENOUS | Status: DC | PRN

## 2022-01-13 MED ORDER — ONDANSETRON 4 MG PO TBDP
4.0000 mg | ORAL_TABLET | Freq: Four times a day (QID) | ORAL | Status: DC | PRN
Start: 2022-01-13 — End: 2022-01-16

## 2022-01-13 MED ORDER — LEVETIRACETAM IN NACL 1000 MG/100ML IV SOLN: 1000.0000 mg | Freq: Two times a day (BID) | INTRAVENOUS | Status: DC

## 2022-01-13 MED ORDER — POLYVINYL ALCOHOL 1.4 % OP SOLN
1.0000 [drp] | Freq: Four times a day (QID) | OPHTHALMIC | Status: DC | PRN
  Filled 2022-01-13: qty 15

## 2022-01-13 MED ORDER — SODIUM CHLORIDE 0.9 % IV SOLN
100.0000 mg | Freq: Two times a day (BID) | INTRAVENOUS | Status: DC
  Administered 2022-01-13 – 2022-01-15 (×5): 100 mg via INTRAVENOUS
  Filled 2022-01-13 (×6): qty 10

## 2022-01-13 NOTE — Progress Notes (Signed)
Palliative:  ?Call to wife Lara Hearst.  We set meeting at bedside today at 1430.  ? ?Mr. Cederquist is lying quietly in bed.  He appears acutely ill.  He is unable to move his right side.  He will briefly make but not keep eye contact.  He does not attempt to tell me his name when asked.  He cannot make his basic needs known.  Detailed conference with bedside nursing staff.   ? ?I return later in the day for scheduled meeting.  Present at bedside is brother, Hassell Done.  As we are talking stepdaughter Tamika arrives.  She shares that Olivia Mackie is still at church and will not be able to be present.  Hassell Done talks at [employer] about Cinque's acute health concerns.  He is able to accurately name his issues.  We talk about meaningful improvements after 19 days in the hospital.  We talk about aspiration pneumonia and managing oral secretions.  We talked about how to make choices for loved ones including 1) keeping them at the center of decision-making 2) are we doing something for them or to them, can we change what is happening 3) the person Josniel was 5 years ago, how would that man tell them to care for him now. ? ?I shared that there are 2 pathway choices for Mr. Alonzo at this point, PEG tube and long-term care placement versus comfort and dignity, let nature take its course.  I shared that even with PEG and placement it is anticipated that Mr. Schipani will continue to aspirate.  Hassell Done shares his experience with her mother who wanted to pass naturally.  He shares that [place] also stated that he wanted to die naturally.  We talk about unburdening Mr. Winberg from treatments that are not changing what is happening or that are painful.  Family agrees. ? ?Call to wife, Olivia Mackie.  We talk about my visit with Hassell Done and [place].  Olivia Mackie shares tearfully that Undra also told her that he would want to die naturally.  At this point she elects comfort and dignity, let nature take its course.  We talk about unburdening Mr. Attia  from treatments that are not changing what is happening or that are painful.  Family agrees.   We talk about comfort care and that Mr. Doskocil will likely be too unstable to transfer out of the hospital. ? ?Conference with attending, bedside nursing staff, [facility] team related to patient condition, needs, GOC, disposition.  ?EOL order set implemented.  ? ?Plan:   FULL COMFORT CARE, end-of-life order set implemented.   I anticipate that Mr. Slingluff will be too unstable to leave the hospital. ?Prognosis: Anticipate hours to days. 48 hours or less.  ? ?60 minute  ?Quinn Axe, NP ?Palliative Medicine Team  ?Team Phone [phone] ?Greater than 50% of this time was spent counseling and coordinating care related to the above assessment and plan.  ?

## 2022-01-13 NOTE — Progress Notes (Signed)
? ?NAME:  Victor Erickson, MRN:  [id number], DOB:  1960/12/06, LOS: 19 ?ADMISSION DATE:  12/25/2021, CONSULTATION DATE:  12/25/2021 ?REFERRING MD: Dr. Karene Fry CHIEF COMPLAINT:  Found down  ? ?History of Present Illness:  ?61 year old man who presented to [facility] 4/25 after being found down. PMHx significant for HTN, HLD, seizures, EtOH abuse. Required intubation for airway protection.  Noted to be in status epilepticus, rhabdomyolysis. Family reported that he decided to quit drinking last week. ? ?PCCM consulted for ICU admission for status epilepticus/EtOH withdrawal. ? ?Pertinent Medical History:  ?HTN, HLD, seizures, EtOH abuse ? ?Significant Hospital Events: ?Including procedures, antibiotic start and stop dates in addition to other pertinent events   ?4/25 admission, CTA head/neck NAICP, small area of encephalomalacial left ant frontal lobe likely from prior trauma, fluid throughout R scalp; CT C.AP> no hematoma, GGO RUL, infiltrates bilateral lower lobes, no RP hematoma, enlarged fatty liver; intubated, LTM started ?4/28 increased vimpat and phenobarb, continue keppra and wean propofol, continue versed infusion; stop zosyn, no seizures on EEG ?4/29 Fever, WBC increased ?4/30 Waking.  Ongoing seizures on EEG ?5/1 concern for ongoing seizure on EEG ?5/3 Continued subclinical seizures on EEG. Slightly improved neuro exam, not yet following commands. ?5/4 Slight improvement in neuro exam. Awake/calm, intermittently following commands. Tolerating PSV better than PRVC. LTM EEG, last seizure 2243. Febrile to 100.44F, broad spectrum abx started (cefepime, linezolid), Resp Cx with gram neg diplococci on prelim. ?5/5 Continued improvement in neuro exam, following commands more consistently. Off sedation. No further fevers. LTM EEG in place. Tolerating PSV 12/5. C-collar removed. ?5/6 no major issues overnight, remains on ventilator this AM.  Currently tolerating pressure support but requiring 12 over PEEP ?5/7 no major issues  overnight, slight agitation on ventilator with lightened sedation. Tracks movements, follows simple commands  ?5/12 tolerated one-way extubation per prior discussions with family. ?5/14 patient to transition to full comfort care per conversation with palliative care ? ?Interim History / Subjective:  ?Extubated yesterday. Continues to have difficulty handling secretions, requiring frequent suctioning.  ? ?Objective:  ?Blood pressure 119/73, pulse 88, temperature 99.8 ?F (37.7 ?C), temperature source Oral, resp. rate 20, height 6\' 1"  (1.854 m), weight 101.5 kg, SpO2 93 %. ?   ?   ? ?Intake/Output Summary (Last 24 hours) at 01/13/2022 1608 ?Last data filed at 01/13/2022 0600 ?Gross per 24 hour  ?Intake 840 ml  ?Output 2000 ml  ?Net -1160 ml  ? ? ?Filed Weights  ? 01/10/22 0500 01/11/22 0500 01/13/22 0500  ?Weight: 102.3 kg 102.4 kg 101.5 kg  ? ?Physical Examination: ?General: critically ill appearing adult male lying in bed in NAD ?HEENT: MM pink/moist, ETT, no JVD ?Neuro: somnolent, opens eyes to stimulation, does not follow commands, does not track ?CV: s1s2 RRR, SR in 90's, no m/r/g ?PULM: clear chest, presently no secretions.  ?GI: soft, bsx4 active  ?Extremities: warm/dry, 1+ generalized dependent edema  ?Skin: areas of pressure from pre-admit unchanged  ? ?Resolved Hospital Problem List:  ?Aspiration Pneumonia ?Hypernatremia ?Found down, wearing hard collar per protocol ?-No acute fracture on CT but has not been able to clear from soft tissue standpoint.  C-collar removed 5/5 ?Rhabdomyolysis  ?Demand Ischemia ? ?Assessment & Plan:  ? ?Non-convulsive status epilepticus (HCC) ?Presented with altered mental status having not been seen for several days.  Difficult to control status epilepticus now resolved for days.  MRI shows restricted diffusion consistent with seizures ?Mental status has improved some with medication reduction.  ? ?- Appears to have  reached new steady -state and seems alert enough to protect airway.   ?- Any further improvement may be slow. Unclear if strength will return on right side.  ? ?Acute respiratory failure with hypoxia (HCC) ?Intubated for airway protection.  Some groundglass opacification on CT. ? ?- Tolerating extubation.  ?- Continue suctioning and chest PT ?- Scopolamine patch to decrease secretions.  ? ?Pressure injury of skin ?Superficial injuries on right temple, hip, knee from fall/immobility.  ? ?- Topical wound care ? ?Malnutrition of moderate degree ? ?Per goals of care conversation patient is now full comfort care.  We will transfer him to palliative. ? ?Best Practice (right click and "Reselect all SmartList Selections" daily)  ?Diet/type: tubefeeds ?DVT prophylaxis: prophylactic heparin  ?GI prophylaxis: PPI ?Lines: N/A ?Foley:  N/A ?Code Status:  DNR ? ?Last date of multidisciplinary goals of care discussion: Patient family enquiring about hospice. Palliative care discussion today: Full comfort care. ? ?Lynnell Catalan, MD FRCPC ?ICU Physician ?[facility]  ?Pager: [phone] ?Mobile: [phone] ?After hours: [phone]. ? ?01/13/2022  4:08 PM ? ? ?

## 2022-01-13 NOTE — Progress Notes (Signed)
Report called to receiving RN [phone]. Family aware of patients pending transfer out of ICU tonight. ?

## 2022-01-14 DIAGNOSIS — A419 Sepsis, unspecified organism: Secondary | ICD-10-CM | POA: Diagnosis not present

## 2022-01-14 DIAGNOSIS — R6521 Severe sepsis with septic shock: Secondary | ICD-10-CM | POA: Diagnosis not present

## 2022-01-14 MED ORDER — LORAZEPAM 2 MG/ML IJ SOLN
2.0000 mg | Freq: Three times a day (TID) | INTRAMUSCULAR | Status: DC
  Administered 2022-01-14 – 2022-01-15 (×5): 2 mg via INTRAVENOUS
  Filled 2022-01-14 (×5): qty 1

## 2022-01-14 NOTE — Progress Notes (Signed)
Nutrition Brief Note  Chart reviewed. Pt now transitioning to comfort care.  No further nutrition interventions planned at this time.  Please re-consult as needed.   Mayvis Agudelo, RD, LDN Clinical Dietitian RD pager # available in AMION  After hours/weekend pager # available in AMION   

## 2022-01-14 NOTE — Progress Notes (Signed)
? ?  This pt was referred to our services for hospice care at the [facility] in [facility]. He has been reviewed and presented to our MD for approval. Our MD does approve the pt for services. Unfortunately, we do not have a bed at this time to offer. Once a bed opens up we will proceed with signing consents and assist with d/c process.  ? ?Thank you- Norm Parcel RN [phone]  ?

## 2022-01-14 NOTE — Progress Notes (Addendum)
Palliative: ?Victor Erickson, Macgregor, is lying quietly in bed.  He appears acutely/chronically ill and frail.  He will briefly make but not keep eye contact.  He is clearly unable to make his basic needs known.  There is no family at bedside at this time. ? ?Call to wife, Meryl Ponder.  We talk about Victor Erickson's acute health concerns and his symptom management.  At this point, he appeared comfortable, unburden from invasive treatments that were not changing things for him.  No additional symptom management needs noted at this time. ?We talked about the benefits of residential hospice for specialist care at end-of-life.  [name] endorses that indeed, [name] is at end-of-life.  She is agreeable to residential hospice referral.  Provider choice offered.  [name] states that they live in [name] and would request [facility] of the [place].  Transition of care team working toward placement. ? ?Conference with attending, bedside nursing staff, transition of care team related to patient condition, needs, goals of care, disposition. ? ?Plan:   Full comfort care.  End-of-life order set in place.  Requesting comfort and dignity at end-of-life, residential hospice with [facility] the [place]. ? ?50 minutes ?[name], NP ?Palliative medicine team ?Team phone [phone] ?Greater than 50% of this time was spent counseling and coordinating care related to the above assessment and plan. ?

## 2022-01-14 NOTE — Progress Notes (Signed)
?PROGRESS NOTE ? ? ? ?Victor Erickson  QMV:[id number] DOB: 12/18/1960 DOA: 12/25/2021 ?[facility]  ? ? ?Brief Narrative:  ? ?Victor CanardStanley Pester is a 61 y.o. male with past medical history significant for essential hypertension, hyperlipidemia, seizure disorder, EtOH abuse who [facility] via EMS after he was found on the floor unresponsive.  Last known well was 2 days prior.  He is a daily drinker and reported seizures in the past in which family unclear if was from alcohol withdrawals.  He is supposed to be on AEDs. On EMS arrival he was noted to have left-sided deviation with right-sided neglect, minimal respirations and unresponsive.  He was actively bagged by EMS until arrival at the ED. Patient was placed in a c-collar and subsequently intubated by EDP on arrival for poor mental status and airway protection.  Neurology was consulted.  PCCM consulted for admission for acute metabolic encephalopathy secondary to status epilepticus and presumed EtOH withdrawal.   ? ?4/25 admission, CTA head/neck NAICP, small area of encephalomalacial left ant frontal lobe likely from prior trauma, fluid throughout R scalp; CT C.AP> no hematoma, GGO RUL, infiltrates bilateral lower lobes, no RP hematoma, enlarged fatty liver; intubated, LTM started ?4/28 increased vimpat and phenobarb, continue keppra and wean propofol, continue versed infusion; stop zosyn, no seizures on EEG ?4/29 Fever, WBC increased ?4/30 Waking.  Ongoing seizures on EEG ?5/1 concern for ongoing seizure on EEG ?5/3 Continued subclinical seizures on EEG. Slightly improved neuro exam, not yet following commands. ?5/4 Slight improvement in neuro exam. Awake/calm, intermittently following commands. Tolerating PSV better than PRVC. LTM EEG, last seizure 2243. Febrile to 100.31F, broad spectrum abx started (cefepime, linezolid), Resp Cx with gram neg diplococci on prelim. ?5/5 Continued improvement in neuro exam, following commands more  consistently. Off sedation. No further fevers. LTM EEG in place. Tolerating PSV 12/5. C-collar removed. ?5/6 no major issues overnight, remains on ventilator this AM.  Currently tolerating pressure support but requiring 12 over PEEP ?5/7 no major issues overnight, slight agitation on ventilator with lightened sedation. Tracks movements, follows simple commands  ?5/12 tolerated one-way extubation per prior discussions with family. ?5/14 patient to transition to full comfort care per conversation with palliative care ? ?Patient was transferred to the hospital service on 01/14/2022 on full comfort measures. ? ?Assessment & Plan: ?  ?Nonconvulsive status epilepticus ?Patient presenting to the ED after being found down with last known normal 2 days prior by family members.  On EMS arrival patient with left-sided deviation and right-sided neglect with minimal respirations.  On arrival to the ED given his mental status and concern for airway protection he was intubated and admitted to the ICU service.  Neurology was consulted and patient underwent EEG with findings of status epilepticus.  Patient was started on multiple AEDs and monitored on continuous EEG with difficult to control status epilepticus.  MRI brain notable for restricted diffusion consistent with seizures.  Given lack of appreciable change in his mental status with likely poor overall prognosis, family elected to transition care to comfort measures on 01/13/2022 with the assistance of palliative care. ? ?Acute respiratory failure with hypoxia ?Community-acquired pneumonia ?Patient was intubated for airway protection with x-rays notable for infiltrates/groundglass opacities.  Patient completed course of antibiotics while inpatient.  Patient was difficult to wean from vent due to his weakness/poor responsiveness, ongoing discussions with pulmonary critical care medicine and family with reluctance he to proceed with tracheostomy given very poor odds that patient  will have any meaningful recovery.  Family elected one-way extubation on 01/11/2022.  Now on comfort measures. ? ?Pressure injury of skin ?Superficial injuries right temple, hip, knee from fall/mobility. ?--Continue frequent offloading/turning ? ?Essential hypertension ?Home amlodipine now discontinued as transition to comfort measures ? ?EtOH use disorder ?Family reported patient with daily drinking and occasionally binge drinking.  Suspect abruption of alcohol use led to EtOH withdrawal/seizures causing him to be unresponsive when family found him down at home. ? ?Moderate protein calorie malnutrition ?Body mass index is 29.52 kg/m?Marland Kitchen ?Nutrition Status: ?Nutrition Problem: Moderate Malnutrition ?Etiology: social / environmental circumstances (relationship stress, EtOH abuse) ?Signs/Symptoms: mild fat depletion, moderate muscle depletion ?Interventions: Tube feeding, Prostat, MVI, Juven ?-- Now transition to comfort measures ? ? ?DVT prophylaxis:  ? ?  Code Status: DNR ?Family Communication:  ? ?Disposition Plan:  ?Level of care: Palliative Care ?Status is: Inpatient ?Remains inpatient appropriate because: Transition to comfort measures, anticipate in-hospital demise ?  ? ?Consultants:  ?PCCM - signed off 5/15 ?Neurology ? ?Procedures:  ?Intubation 4/25 ?One-way extubation 5/12 ? ?Antimicrobials:  ?Zosyn 4/26 -4/27 ?Metronidazole 4/25 - 4/25 ?Vancomycin 4/25 - 4/25 ?Cefepime 4/25, 5/4 ?Linezolid 5/4 - 5/5 ?Ceftriaxone 5/5 - 5/8 ?Augmentin 4/29 - 4/30 ? ? ?Subjective: ?Patient seen examined bedside, resting comfortably.  No family present.  Opens eyes no other significant response.  Transitioned to comfort measures yesterday.  Overall grim prognosis.  No acute concerns overnight per nursing staff. ? ?Objective: ?Vitals:  ? 01/13/22 1600 01/13/22 1700 01/13/22 1859 01/13/22 2144  ?BP: 118/71 118/76 123/73 116/69  ?Pulse: 81 86 79 80  ?Resp: (!) 24 (!) 22 16 (!) 32  ?Temp:   99.5 ?F (37.5 ?C) 100 ?F (37.8 ?C)  ?TempSrc:    Oral Oral  ?SpO2: 98% 97% 98% 95%  ?Weight:      ?Height:      ? ? ?Intake/Output Summary (Last 24 hours) at 01/14/2022 0825 ?Last data filed at 01/14/2022 0600 ?Gross per 24 hour  ?Intake 675 ml  ?Output 2400 ml  ?Net -1725 ml  ? ?Filed Weights  ? 01/10/22 0500 01/11/22 0500 01/13/22 0500  ?Weight: 102.3 kg 102.4 kg 101.5 kg  ? ? ?Examination: ? ?Physical Exam: ?GEN: NAD, opens eyes to noxious stimulus ?HEENT: PERRL, sclera clear, MMM ?PULM: CTAB w/o wheezes/crackles, normal respiratory effort, on room air ?CV: RRR w/o M/G/R ?GI: abd soft, NTND, NABS, no R/G/M ?MSK: no peripheral edema ?NEURO: Opens eyes and withdraws to noxious stimuli, does not follow commands ?PSYCH: normal mood/affect ? ? ? ?Data Reviewed: I have personally reviewed following labs and imaging studies ? ?CBC: ?Recent Labs  ?Lab 01/08/22 ?0413 01/08/22 ?2595 01/08/22 ?1025 01/08/22 ?1412 01/09/22 ?0220 01/10/22 ?6387 01/13/22 ?0246  ?WBC 10.3  --   --   --  10.4 11.3* 11.5*  ?HGB 9.3*   < > 10.1* 9.6* 10.1* 9.8* 9.7*  ?HCT 28.7*   < > 31.3* 29.6* 31.9* 30.3* 29.8*  ?MCV 94.4  --   --   --  95.8 94.1 93.7  ?PLT 369  --   --   --  368 361 390  ? < > = values in this interval not displayed.  ? ?Basic Metabolic Panel: ?Recent Labs  ?Lab 01/08/22 ?0413 01/09/22 ?0220 01/10/22 ?5643 01/13/22 ?0246  ?NA 137 137 138 138  ?K 4.1 4.2 4.0 4.0  ?CL 101 100 102 100  ?CO2 27 28 30 31   ?GLUCOSE 116* 103* 106* 117*  ?BUN 17 17 19 18   ?  CREATININE 0.47* 0.47* 0.52* 0.44*  ?CALCIUM 8.1* 8.6* 8.7* 8.7*  ?MG 1.7 1.8 1.8  --   ? ?GFR: ?Estimated Creatinine Clearance: 126 mL/min (A) (by C-G formula based on SCr of 0.44 mg/dL (L)). ?Liver Function Tests: ?Recent Labs  ?Lab 01/10/22 ?0212  ?AST 46*  ?ALT 47*  ?ALKPHOS 104  ?BILITOT 0.3  ?PROT 6.6  ?ALBUMIN 1.9*  ? ?No results for input(s): LIPASE, AMYLASE in the last 168 hours. ?No results for input(s): AMMONIA in the last 168 hours. ?Coagulation Profile: ?No results for input(s): INR, PROTIME in the last 168  hours. ?Cardiac Enzymes: ?No results for input(s): CKTOTAL, CKMB, CKMBINDEX, TROPONINI in the last 168 hours. ?BNP (last 3 results) ?No results for input(s): PROBNP in the last 8760 hours. ?HbA1C: ?No results for

## 2022-01-15 LAB — CULTURE, BLOOD (ROUTINE X 2)
Culture: NO GROWTH
Culture: NO GROWTH
Special Requests: ADEQUATE

## 2022-01-15 MED ORDER — GLYCOPYRROLATE 0.2 MG/ML IJ SOLN
0.4000 mg | Freq: Three times a day (TID) | INTRAMUSCULAR | Status: DC
  Administered 2022-01-15 (×3): 0.4 mg via INTRAVENOUS
  Filled 2022-01-15 (×4): qty 2

## 2022-01-15 MED ORDER — LACOSAMIDE 100 MG PO TABS
100.0000 mg | ORAL_TABLET | Freq: Two times a day (BID) | ORAL | 0 refills | Status: AC
Start: 2022-01-15 — End: 2022-02-14

## 2022-01-15 NOTE — TOC Progression Note (Addendum)
Transition of Care (TOC) - Progression Note  ? ? ?Patient Details  ?Name: Victor Erickson ?MRN: [id number] ?Date of Birth: Feb 27, 1961 ? ?Transition of Care (TOC) CM/SW Contact  ?Kingsley Plan, RN ?Phone Number: ?01/15/2022, 10:02 AM ? ?Clinical Narrative:    ? ?Patient has a bed at [facility] today. Cheri with [facility] has spoken to patient's wife and she has accepted. Once consents are completed, [name] will notify TOC to arrange [facility] transport and nurse to call report.  ? ?PTAR paperwork and DNR form on chart.  ? ?[facility] [place] requesting [facility] pick at 3pm .[facility] called .  Number for nurse to call report is [phone]  ? ?  ?  ? ?Expected Discharge Plan and Services ?  ?  ?  ?  ?  ?Expected Discharge Date: 01/15/22               ?  ?  ?  ?  ?  ?  ?  ?  ?  ?  ? ? ?Social Determinants of Health (SDOH) Interventions ?  ? ?Readmission Risk Interventions ?   ? View : No data to display.  ?  ?  ?  ? ? ?

## 2022-01-15 NOTE — Progress Notes (Addendum)
Patient son and family here to visit. Everyone was upset esp the son that father is not on a diet and getting IVFs. Insisted that he wanted to speak with a doctor. Palliative Care notified. Dr. Phillips Odor called me twice concerning this matter. A family meeting with Palliative Care tomorrow at 1530 has been arranged.  ?

## 2022-01-15 NOTE — Progress Notes (Signed)
Patient Victor Erickson      DOB: Feb 09, 1961      JJK:[id number] ? ? ? ?  ?Palliative Medicine Team ? ? ? ?Subjective: Bedside symptom check completed. No family or visitors present at this time.  ? ? ?Physical exam: Patient resting in bed with eyes closed at time of visit. Breathing even and non-labored, no excessive secretions noted. Patient without physical or non-verbal signs of pain or discomfort at this time. Patient comfortably resting. This RN did not attempt to wake patient to ensure comfort and promote rest.  ? ? ?Assessment and plan: Patient remains stable for transport to residential hospice if bed is offered today. Bedside RN Carollee Herter without any concerns or needs at this time. This RN remains available to assist with any needs that come up throughout the day. Will continue to follow for any changes or advances.  ? ? ?Thank you for allowing the Palliative Medicine Team to assist in the care of this patient. ?  ?  ?Shelda Jakes, MSN, RN ?Palliative Medicine Team ?Team Phone: [phone]  ?This phone is monitored 7a-7p, please reach out to attending physician outside of these hours for urgent needs.   ?

## 2022-01-15 NOTE — Progress Notes (Signed)
Called report to [place] at [facility] ?

## 2022-01-15 NOTE — Plan of Care (Signed)
?  Problem: Education: ?Goal: Knowledge of the prescribed therapeutic regimen will improve ?Outcome: Not Progressing ?  ?Problem: Coping: ?Goal: Ability to identify and develop effective coping behavior will improve ?Outcome: Not Progressing ?  ?Problem: Clinical Measurements: ?Goal: Quality of life will improve ?Outcome: Not Progressing ?  ?Problem: Respiratory: ?Goal: Verbalizations of increased ease of respirations will increase ?Outcome: Progressing ?  ?Problem: Role Relationship: ?Goal: Family's ability to cope with current situation will improve ?Outcome: Not Progressing ?  ?

## 2022-01-15 NOTE — Plan of Care (Signed)
  Problem: Clinical Measurements: Goal: Quality of life will improve Outcome: Progressing   Problem: Respiratory: Goal: Verbalizations of increased ease of respirations will increase Outcome: Progressing   

## 2022-01-15 NOTE — Progress Notes (Signed)
Palliative: ? ?I was notified of family confusion last night. Maximilian appears to be resting comfortably with close friend at bedside. He feels very warm to touch and likely febrile - spoke with RN about potential need for Tylenol. He does open eyes briefly on occasion but not to command. He does not have any further responses and does not follow commands. RN reports that there are plans for transition to hospice facility today and transport scheduled at 3pm. I explained that I will be available if needed if family has any further questions or concerns.  ? ?No charge ? ?Yong Channel, NP ?Palliative Medicine Team ?Pager [phone] (Please see [email or web address] for schedule) ?Team Phone [phone]  ?

## 2022-01-15 NOTE — Discharge Summary (Signed)
?Physician Discharge Summary  ?Victor Erickson [id number] DOB: 1961-06-22 DOA: 12/25/2021 ? ?PCP: [facility] [place] ? ?Admit date: 12/25/2021 ?Discharge date: 01/15/2022 ? ?Admitted From: Home ?Disposition: Residential hospice ? ?Recommendations for Outpatient Follow-up:  ?Follow up with hospice provider ? ?Discharge Condition: Guarded ?CODE STATUS: DNR ?Diet recommendation: Comfort feeds as tolerates ? ?History of present illness: ? ?Victor Erickson is a 61 y.o. male with past medical history significant for essential hypertension, hyperlipidemia, seizure disorder, EtOH abuse who presented to [facility] ED on 4/25 via EMS after he was found on the floor unresponsive.  Last known well was 2 days prior.  He is a daily drinker and reported seizures in the past in which family unclear if was from alcohol withdrawals.  He is supposed to be on AEDs. On EMS arrival he was noted to have left-sided deviation with right-sided neglect, minimal respirations and unresponsive.  He was actively bagged by EMS until arrival at the ED. Patient was placed in a c-collar and subsequently intubated by EDP on arrival for poor mental status and airway protection.  Neurology was consulted.  PCCM consulted for admission for acute metabolic encephalopathy secondary to status epilepticus and presumed EtOH withdrawal.   ?  ?4/25 admission, CTA head/neck NAICP, small area of encephalomalacial left ant frontal lobe likely from prior trauma, fluid throughout R scalp; CT C.AP> no hematoma, GGO RUL, infiltrates bilateral lower lobes, no RP hematoma, enlarged fatty liver; intubated, LTM started ?4/28 increased vimpat and phenobarb, continue keppra and wean propofol, continue versed infusion; stop zosyn, no seizures on EEG ?4/29 Fever, WBC increased ?4/30 Waking.  Ongoing seizures on EEG ?5/1 concern for ongoing seizure on EEG ?5/3 Continued subclinical seizures on EEG. Slightly improved neuro exam, not yet following commands. ?5/4 Slight improvement  in neuro exam. Awake/calm, intermittently following commands. Tolerating PSV better than PRVC. LTM EEG, last seizure 2243. Febrile to 100.4F, broad spectrum abx started (cefepime, linezolid), Resp Cx with gram neg diplococci on prelim. ?5/5 Continued improvement in neuro exam, following commands more consistently. Off sedation. No further fevers. LTM EEG in place. Tolerating PSV 12/5. C-collar removed. ?5/6 no major issues overnight, remains on ventilator this AM.  Currently tolerating pressure support but requiring 12 over PEEP ?5/7 no major issues overnight, slight agitation on ventilator with lightened sedation. Tracks movements, follows simple commands  ?5/12 tolerated one-way extubation per prior discussions with family. ?5/14 patient to transition to full comfort care per conversation with palliative care ?  ?Patient was transferred to the hospitalist service on 01/14/2022 on full comfort measures ? ?Hospital course: ? ?Nonconvulsive status epilepticus ?Patient presenting to the ED after being found down with last known normal 2 days prior by family members.  On EMS arrival patient with left-sided deviation and right-sided neglect with minimal respirations.  On arrival to the ED given his mental status and concern for airway protection he was intubated and admitted to the ICU service.  Neurology was consulted and patient underwent EEG with findings of status epilepticus.  Patient was started on multiple AEDs and monitored on continuous EEG with difficult to control status epilepticus.  MRI brain notable for restricted diffusion consistent with seizures.  Given lack of appreciable change in his mental status with likely poor overall prognosis, family elected to transition care to comfort measures on 01/13/2022 with the assistance of palliative care.  Continue Vimpat 100 mg p.o. twice daily if able, IV/IM Ativan as needed for recurrence of seizures.  Discharging to residential hospice. ?  ?Acute respiratory  failure with hypoxia ?Community-acquired pneumonia ?Patient was intubated for airway protection with x-rays notable for infiltrates/groundglass opacities.  Patient completed course of antibiotics while inpatient.  Patient was difficult to wean from vent due to his weakness/poor responsiveness, ongoing discussions with pulmonary critical care medicine and family with reluctance he to proceed with tracheostomy given very poor odds that patient will have any meaningful recovery.  Family elected one-way extubation on 01/11/2022.  Now on comfort measures and discharging to residential hospice ?  ?Pressure injury of skin ?Superficial injuries right temple, hip, knee from fall/mobility. Continue frequent offloading/turning ?  ?Essential hypertension ?Home amlodipine now discontinued as transitioned to comfort measures ?  ?EtOH use disorder ?Family reported patient with daily drinking and occasionally binge drinking. Suspect abruption of alcohol use led to EtOH withdrawal/seizures causing him to be unresponsive when family found him down at home. ?  ?Moderate protein calorie malnutrition ?Body mass index is 29.52 kg/m?Marland Kitchen ?Nutrition Status: ?Nutrition Problem: Moderate Malnutrition ?Etiology: social / environmental circumstances (relationship stress, EtOH abuse) ?Signs/Symptoms: mild fat depletion, moderate muscle depletion ?Interventions: Tube feeding, Prostat, MVI, Juven ?Now transition to comfort measures, comfort feeds as tolerates ? ?Discharge Diagnoses:  ?Active Problems: ?  Non-convulsive status epilepticus ([place]) ?  Acute respiratory failure with hypoxia ([place]) ?  Steatohepatitis, alcoholic ?  Pressure injury of skin ?  Malnutrition of moderate degree ? ? ? ?Discharge Instructions ? ?Discharge Instructions   ? ? Diet - low sodium heart healthy   Complete by: As directed ?  ? Increase activity slowly   Complete by: As directed ?  ? No wound care   Complete by: As directed ?  ? ?  ? ?Allergies as of 01/15/2022   ?No Known  Allergies ?  ? ?  ?Medication List  ?  ? ?STOP taking these medications   ? ?amLODipine 5 MG tablet ?Commonly known as: NORVASC ?  ?methocarbamol 500 MG tablet ?Commonly known as: ROBAXIN ?  ? ?  ? ?TAKE these medications   ? ?Lacosamide 100 MG Tabs ?Commonly known as: Vimpat ?Take 1 tablet (100 mg total) by mouth 2 (two) times daily. ?  ? ?  ? ? ?No Known Allergies ? ?Consultations: ?PCCM ?Neurology ?Palliative care ? ? ?Procedures/Studies: ?CT ANGIO HEAD NECK W WO CM ? ?Result Date: 12/25/2021 ?CLINICAL DATA:  Patient found down trauma EXAM: CT ANGIOGRAPHY HEAD AND NECK TECHNIQUE: Multidetector CT imaging of the head and neck was performed using the standard protocol during bolus administration of intravenous contrast. Multiplanar CT image reconstructions and MIPs were obtained to evaluate the vascular anatomy. Carotid stenosis measurements (when applicable) are obtained utilizing NASCET criteria, using the distal internal carotid diameter as the denominator. RADIATION DOSE REDUCTION: This exam was performed according to the departmental dose-optimization program which includes automated exposure control, adjustment of the mA and/or kV according to patient size and/or use of iterative reconstruction technique. CONTRAST:  168mL OMNIPAQUE IOHEXOL 350 MG/ML SOLN COMPARISON:  None. FINDINGS: Brain: There is no acute intracranial hemorrhage, extra-axial fluid collection, or acute infarct Parenchymal volume is normal. The ventricles are normal in size. There is a small area of encephalomalacia in the left anterior frontal lobe. Gray-white differentiation is otherwise preserved. There are prominent dural calcifications overlying both cerebral hemispheres. There is no suspicious mass lesion. There is no mass effect or midline shift. Vascular: No hyperdense vessel or unexpected calcification. Skull: There is no calvarial fracture. Sinuses/Orbits: There is fluid throughout the sinonasal cavity which may be related to  instrumentation. There  is a dysconjugate gaze. Other: There is a moderate amount of fluid throughout the right scalp. CTA NECK FINDINGS Aortic arch: The imaged aortic arch is normal. The origins of the major branc

## 2022-01-31 DEATH — deceased
